# Patient Record
Sex: Male | Born: 1970 | Race: White | Hispanic: No | Marital: Married | Smoking: Never smoker
Health system: Southern US, Community
[De-identification: ages and names within clinical notes are randomized; demographics above are authoritative.]

## PROBLEM LIST (undated history)

## (undated) DIAGNOSIS — K219 Gastro-esophageal reflux disease without esophagitis: Secondary | ICD-10-CM

## (undated) DIAGNOSIS — G709 Myoneural disorder, unspecified: Secondary | ICD-10-CM

## (undated) DIAGNOSIS — I10 Essential (primary) hypertension: Secondary | ICD-10-CM

## (undated) DIAGNOSIS — M2550 Pain in unspecified joint: Secondary | ICD-10-CM

## (undated) DIAGNOSIS — F419 Anxiety disorder, unspecified: Secondary | ICD-10-CM

## (undated) DIAGNOSIS — M1990 Unspecified osteoarthritis, unspecified site: Secondary | ICD-10-CM

## (undated) DIAGNOSIS — R011 Cardiac murmur, unspecified: Secondary | ICD-10-CM

## (undated) DIAGNOSIS — Z9889 Other specified postprocedural states: Secondary | ICD-10-CM

## (undated) DIAGNOSIS — Z9689 Presence of other specified functional implants: Secondary | ICD-10-CM

## (undated) DIAGNOSIS — G4730 Sleep apnea, unspecified: Secondary | ICD-10-CM

## (undated) DIAGNOSIS — R112 Nausea with vomiting, unspecified: Secondary | ICD-10-CM

## (undated) HISTORY — PX: BACK SURGERY: SHX140

## (undated) HISTORY — PX: NASAL SINUS SURGERY: SHX719

## (undated) HISTORY — PX: ANKLE DEBRIDEMENT: SHX1147

## (undated) HISTORY — PX: ANKLE SURGERY: SHX546

## (undated) HISTORY — PX: LAMINECTOMY THORACIC SPINE W/ PLACEMENT SPINAL CORD STIMULATOR: SHX1917

## (undated) HISTORY — PX: SHOULDER OPEN ROTATOR CUFF REPAIR: SHX2407

## (undated) HISTORY — PX: CARDIAC CATHETERIZATION: SHX172

---

## 2003-12-20 HISTORY — PX: ANKLE SURGERY: SHX546

## 2010-02-04 ENCOUNTER — Ambulatory Visit (HOSPITAL_COMMUNITY): Admission: RE | Admit: 2010-02-04 | Discharge: 2010-02-04 | Payer: Self-pay | Admitting: Maternal and Fetal Medicine

## 2011-10-25 NOTE — H&P (Signed)
  NTS SOAP Note  Vital Signs:  Vitals as of: 10/25/2011: Systolic 132: Diastolic 75: Heart Rate 61: Temp 48F: Height 46ft 3in: Weight 294Lbs 0 Ounces: Pain Level 6: BMI 37  BMI : 36.75 kg/m2  Subjective: This 40 Years 59 Months old Male presents for of ABDOMINAL ISSUES: ,Has been having right upper quadrant abdominal pain with radiation to the right flank, nausea, and indigestion for the past few months. Was seen up in [place] for chest pain, underwent cardiac cath which was negative and EGD which showed reflux esophagitis. U/S of gallbladder negative, HIDA showed low gallbladder ejection fraction with reproducible symptoms with CCK injection. Denies fever, chills, jaundice.  Review of Symptoms:  Constitutional: unremarkable  Head: unremarkable  Eyes:unremarkable  Nose/Mouth/Throat:unremarkable  Cardiovascular:unremarkable  Respiratory: unremarkable  Gastrointestin abdominal pain,nausea,heartburn Genitourinary: unremarkable  back pain Skin: unremarkable  Hematolgic/Lymphatic: unremarkable  Allergic/Immunologic:unremarkable    Past Medical History:Reviewed  Past Medical History  Surgical History: cardiac cath, left ankle Medical Problems: High Blood pressure Psychiatric History: Depression Allergies: mobic Medications: paxil, ASA, bystolic, omeprazole   Social History: Reviewed   Social History  Preferred Language: English ([place]) Race: White Ethnicity: Not Hispanic / Latino Age: 40 Years 6 Months Marital Status: M Alcohol: Yes, socially Recreational drug(s): No   Smoking Status: Never smoker reviewed on 10/25/2011  Family History: Reviewed  Family History  Father: Coronary Artery Disease Mother: Cancer, Hypertension    Objective Information:  General: Well appearing, well nourished in no distress.  Skin:no rash or prominent lesions  Head: Atraumatic; no masses; no abnormalities  No scleral icterus Neck: Supple without lymphadenopathy.    Heart: RRR, no murmur or gallop. Normal S1, S2. No S3, S4.  Lungs:CTA bilaterally, no wheezes, rhonchi, rales. Breathing unlabored.  Abdomen: Soft, slightly tender in right upper quadrant to palpation, ND, no HSM, no masses.   Assessment: Chronic cholecystitis  Diagnosis & Procedure: DiagnosisCode: 575.11, ProcedureCode: 95621,    Plan: Scheduled for laparoscopic cholecystectomy on 10/28/11.   Patient Education: Alternative treatments to surgery were discussed with patient (and family).Risks and benefits of procedure were fully explained to the patient (and family) who gave informed consent. Patient/family questions were addressed.  Follow-up: Pending Surgery

## 2011-10-26 ENCOUNTER — Encounter (HOSPITAL_COMMUNITY): Payer: Self-pay

## 2011-10-26 ENCOUNTER — Encounter (HOSPITAL_COMMUNITY): Payer: Self-pay | Admitting: Pharmacy Technician

## 2011-10-27 ENCOUNTER — Encounter (HOSPITAL_COMMUNITY)
Admission: RE | Admit: 2011-10-27 | Discharge: 2011-10-27 | Payer: BC Managed Care – PPO | Source: Ambulatory Visit | Admitting: General Surgery

## 2011-10-27 HISTORY — DX: Anxiety disorder, unspecified: F41.9

## 2011-10-27 HISTORY — DX: Cardiac murmur, unspecified: R01.1

## 2011-10-27 HISTORY — DX: Essential (primary) hypertension: I10

## 2011-10-28 ENCOUNTER — Other Ambulatory Visit: Payer: Self-pay | Admitting: General Surgery

## 2011-10-28 ENCOUNTER — Encounter (HOSPITAL_COMMUNITY): Payer: Self-pay | Admitting: Anesthesiology

## 2011-10-28 ENCOUNTER — Other Ambulatory Visit: Payer: Self-pay

## 2011-10-28 ENCOUNTER — Encounter (HOSPITAL_COMMUNITY): Payer: Self-pay | Admitting: *Deleted

## 2011-10-28 ENCOUNTER — Encounter (HOSPITAL_COMMUNITY)
Admission: RE | Disposition: A | Discharge status: Home / Self Care | Payer: Self-pay | Source: Ambulatory Visit | Attending: General Surgery

## 2011-10-28 ENCOUNTER — Ambulatory Visit (HOSPITAL_COMMUNITY)
Admission: RE | Admit: 2011-10-28 | Discharge: 2011-10-28 | Disposition: A | Discharge status: Home / Self Care | Payer: BC Managed Care – PPO | Source: Ambulatory Visit | Attending: General Surgery | Admitting: General Surgery

## 2011-10-28 ENCOUNTER — Ambulatory Visit (HOSPITAL_COMMUNITY): Payer: BC Managed Care – PPO | Admitting: Anesthesiology

## 2011-10-28 DIAGNOSIS — Z79899 Other long term (current) drug therapy: Secondary | ICD-10-CM | POA: Insufficient documentation

## 2011-10-28 DIAGNOSIS — Z7982 Long term (current) use of aspirin: Secondary | ICD-10-CM | POA: Insufficient documentation

## 2011-10-28 DIAGNOSIS — K8010 Calculus of gallbladder with chronic cholecystitis without obstruction: Secondary | ICD-10-CM | POA: Insufficient documentation

## 2011-10-28 DIAGNOSIS — Z01812 Encounter for preprocedural laboratory examination: Secondary | ICD-10-CM | POA: Insufficient documentation

## 2011-10-28 DIAGNOSIS — I10 Essential (primary) hypertension: Secondary | ICD-10-CM | POA: Insufficient documentation

## 2011-10-28 HISTORY — PX: CHOLECYSTECTOMY: SHX55

## 2011-10-28 LAB — SURGICAL PCR SCREEN
MRSA, PCR: POSITIVE — AB
Staphylococcus aureus: POSITIVE — AB

## 2011-10-28 LAB — POCT I-STAT 4, (NA,K, GLUC, HGB,HCT)
Glucose, Bld: 94 mg/dL (ref 70–99)
HCT: 39 % (ref 39.0–52.0)
Hemoglobin: 13.3 g/dL (ref 13.0–17.0)
Potassium: 4.4 mEq/L (ref 3.5–5.1)
Sodium: 140 mEq/L (ref 135–145)

## 2011-10-28 SURGERY — LAPAROSCOPIC CHOLECYSTECTOMY
Anesthesia: General | Site: Abdomen | Wound class: Clean Contaminated

## 2011-10-28 MED ORDER — LIDOCAINE HCL 1 % IJ SOLN
INTRAMUSCULAR | Status: DC | PRN
  Administered 2011-10-28: 40 mg via INTRADERMAL

## 2011-10-28 MED ORDER — LACTATED RINGERS IV SOLN
INTRAVENOUS | Status: DC
  Administered 2011-10-28: 13:00:00 via INTRAVENOUS
  Administered 2011-10-28: 1000 mL via INTRAVENOUS

## 2011-10-28 MED ORDER — GLYCOPYRROLATE 0.2 MG/ML IJ SOLN
INTRAMUSCULAR | Status: AC
  Filled 2011-10-28: qty 1

## 2011-10-28 MED ORDER — NEOSTIGMINE METHYLSULFATE 1 MG/ML IJ SOLN
INTRAMUSCULAR | Status: DC | PRN
  Administered 2011-10-28: 3 mg via INTRAVENOUS

## 2011-10-28 MED ORDER — ENOXAPARIN SODIUM 40 MG/0.4ML ~~LOC~~ SOLN
40.0000 mg | Freq: Once | SUBCUTANEOUS | Status: AC
  Administered 2011-10-28: 40 mg via SUBCUTANEOUS

## 2011-10-28 MED ORDER — GLYCOPYRROLATE 0.2 MG/ML IJ SOLN
INTRAMUSCULAR | Status: AC
  Administered 2011-10-28: 0.2 mg via INTRAVENOUS
  Filled 2011-10-28: qty 1

## 2011-10-28 MED ORDER — MUPIROCIN 2 % EX OINT
TOPICAL_OINTMENT | CUTANEOUS | Status: AC
  Administered 2011-10-28: 1
  Filled 2011-10-28: qty 22

## 2011-10-28 MED ORDER — ENOXAPARIN SODIUM 40 MG/0.4ML ~~LOC~~ SOLN
SUBCUTANEOUS | Status: AC
  Administered 2011-10-28: 40 mg via SUBCUTANEOUS
  Filled 2011-10-28: qty 0.4

## 2011-10-28 MED ORDER — SUCCINYLCHOLINE CHLORIDE 20 MG/ML IJ SOLN
INTRAMUSCULAR | Status: DC | PRN
  Administered 2011-10-28: 200 mg via INTRAVENOUS

## 2011-10-28 MED ORDER — ROCURONIUM BROMIDE 100 MG/10ML IV SOLN
INTRAVENOUS | Status: DC | PRN
  Administered 2011-10-28: 35 mg via INTRAVENOUS

## 2011-10-28 MED ORDER — FENTANYL CITRATE 0.05 MG/ML IJ SOLN
25.0000 ug | INTRAMUSCULAR | Status: DC | PRN
  Administered 2011-10-28 (×2): 50 ug via INTRAVENOUS

## 2011-10-28 MED ORDER — ACETAMINOPHEN 10 MG/ML IV SOLN
INTRAVENOUS | Status: AC
  Administered 2011-10-28: 1000 mg via INTRAVENOUS
  Filled 2011-10-28: qty 100

## 2011-10-28 MED ORDER — PROPOFOL 10 MG/ML IV EMUL
INTRAVENOUS | Status: AC
  Filled 2011-10-28: qty 20

## 2011-10-28 MED ORDER — GLYCOPYRROLATE 0.2 MG/ML IJ SOLN
INTRAMUSCULAR | Status: DC | PRN
  Administered 2011-10-28: .4 mg via INTRAVENOUS

## 2011-10-28 MED ORDER — MIDAZOLAM HCL 2 MG/2ML IJ SOLN
1.0000 mg | INTRAMUSCULAR | Status: AC | PRN
  Administered 2011-10-28 (×3): 2 mg via INTRAVENOUS

## 2011-10-28 MED ORDER — MIDAZOLAM HCL 2 MG/2ML IJ SOLN
INTRAMUSCULAR | Status: AC
  Administered 2011-10-28: 2 mg via INTRAVENOUS
  Filled 2011-10-28: qty 2

## 2011-10-28 MED ORDER — PROPOFOL 10 MG/ML IV EMUL
INTRAVENOUS | Status: DC | PRN
  Administered 2011-10-28: 200 mg via INTRAVENOUS

## 2011-10-28 MED ORDER — ONDANSETRON HCL 4 MG/2ML IJ SOLN
INTRAMUSCULAR | Status: AC
  Administered 2011-10-28: 4 mg via INTRAVENOUS
  Filled 2011-10-28: qty 2

## 2011-10-28 MED ORDER — FENTANYL CITRATE 0.05 MG/ML IJ SOLN
INTRAMUSCULAR | Status: DC | PRN
  Administered 2011-10-28: 150 ug via INTRAVENOUS
  Administered 2011-10-28: 50 ug via INTRAVENOUS
  Administered 2011-10-28: 100 ug via INTRAVENOUS

## 2011-10-28 MED ORDER — CEFAZOLIN SODIUM 1-5 GM-% IV SOLN
INTRAVENOUS | Status: AC
  Filled 2011-10-28: qty 50

## 2011-10-28 MED ORDER — NEOSTIGMINE METHYLSULFATE 1 MG/ML IJ SOLN
INTRAMUSCULAR | Status: AC
  Filled 2011-10-28: qty 10

## 2011-10-28 MED ORDER — SUCCINYLCHOLINE CHLORIDE 20 MG/ML IJ SOLN
INTRAMUSCULAR | Status: AC
  Filled 2011-10-28: qty 1

## 2011-10-28 MED ORDER — BUPIVACAINE HCL 0.5 % IJ SOLN
INTRAMUSCULAR | Status: DC | PRN
  Administered 2011-10-28: 10 mL

## 2011-10-28 MED ORDER — CEFAZOLIN SODIUM-DEXTROSE 2-3 GM-% IV SOLR: 2.0000 g | INTRAVENOUS | Status: DC

## 2011-10-28 MED ORDER — GLYCOPYRROLATE 0.2 MG/ML IJ SOLN
0.2000 mg | Freq: Once | INTRAMUSCULAR | Status: AC | PRN
  Administered 2011-10-28: 0.2 mg via INTRAVENOUS

## 2011-10-28 MED ORDER — LIDOCAINE HCL (PF) 1 % IJ SOLN
INTRAMUSCULAR | Status: AC
  Filled 2011-10-28: qty 5

## 2011-10-28 MED ORDER — OXYCODONE-ACETAMINOPHEN 7.5-325 MG PO TABS: 1.0000 | ORAL_TABLET | ORAL | Status: AC | PRN

## 2011-10-28 MED ORDER — ONDANSETRON HCL 4 MG/2ML IJ SOLN
4.0000 mg | Freq: Once | INTRAMUSCULAR | Status: AC
  Administered 2011-10-28: 4 mg via INTRAVENOUS

## 2011-10-28 MED ORDER — LACTATED RINGERS IV SOLN: INTRAVENOUS | Status: DC

## 2011-10-28 MED ORDER — MIDAZOLAM HCL 5 MG/5ML IJ SOLN
INTRAMUSCULAR | Status: DC | PRN
  Administered 2011-10-28: 2 mg via INTRAVENOUS

## 2011-10-28 MED ORDER — ONDANSETRON HCL 4 MG/2ML IJ SOLN
4.0000 mg | Freq: Once | INTRAMUSCULAR | Status: AC | PRN
  Administered 2011-10-28: 4 mg via INTRAVENOUS

## 2011-10-28 MED ORDER — CEFAZOLIN SODIUM 1-5 GM-% IV SOLN
INTRAVENOUS | Status: DC | PRN
  Administered 2011-10-28: 2 g via INTRAVENOUS

## 2011-10-28 MED ORDER — FENTANYL CITRATE 0.05 MG/ML IJ SOLN
INTRAMUSCULAR | Status: AC
  Filled 2011-10-28: qty 5

## 2011-10-28 MED ORDER — FENTANYL CITRATE 0.05 MG/ML IJ SOLN
INTRAMUSCULAR | Status: AC
  Administered 2011-10-28: 50 ug via INTRAVENOUS
  Filled 2011-10-28: qty 2

## 2011-10-28 MED ORDER — MIDAZOLAM HCL 2 MG/2ML IJ SOLN
INTRAMUSCULAR | Status: AC
  Filled 2011-10-28: qty 2

## 2011-10-28 MED ORDER — ACETAMINOPHEN 10 MG/ML IV SOLN
1000.0000 mg | Freq: Once | INTRAVENOUS | Status: AC
  Administered 2011-10-28: 1000 mg via INTRAVENOUS

## 2011-10-28 MED ORDER — ACETAMINOPHEN 325 MG PO TABS: 325.0000 mg | ORAL_TABLET | ORAL | Status: DC | PRN

## 2011-10-28 MED ORDER — HEMOSTATIC AGENTS (NO CHARGE) OPTIME
TOPICAL | Status: DC | PRN
  Administered 2011-10-28: 1 via TOPICAL

## 2011-10-28 MED ORDER — FENTANYL CITRATE 0.05 MG/ML IJ SOLN
INTRAMUSCULAR | Status: AC
Start: 2011-10-28 — End: 2011-10-28
  Administered 2011-10-28: 50 ug via INTRAVENOUS
  Filled 2011-10-28: qty 2

## 2011-10-28 MED ORDER — SODIUM CHLORIDE 0.9 % IR SOLN
Status: DC | PRN
  Administered 2011-10-28: 1000 mL

## 2011-10-28 MED ORDER — ROCURONIUM BROMIDE 50 MG/5ML IV SOLN
INTRAVENOUS | Status: AC
  Filled 2011-10-28: qty 1

## 2011-10-28 MED ORDER — BUPIVACAINE HCL (PF) 0.5 % IJ SOLN
INTRAMUSCULAR | Status: AC
  Filled 2011-10-28: qty 30

## 2011-10-28 SURGICAL SUPPLY — 33 items
APPLIER CLIP ROT 10 11.4 M/L (STAPLE)
BAG HAMPER (MISCELLANEOUS) ×2 IMPLANT
CLIP APPLIE ROT 10 11.4 M/L (STAPLE) IMPLANT
CLOTH BEACON ORANGE TIMEOUT ST (SAFETY) ×2 IMPLANT
COVER LIGHT HANDLE STERIS (MISCELLANEOUS) ×4 IMPLANT
DECANTER SPIKE VIAL GLASS SM (MISCELLANEOUS) ×2 IMPLANT
DURAPREP 26ML APPLICATOR (WOUND CARE) ×2 IMPLANT
ELECT REM PT RETURN 9FT ADLT (ELECTROSURGICAL) ×2
ELECTRODE REM PT RTRN 9FT ADLT (ELECTROSURGICAL) ×1 IMPLANT
FILTER SMOKE EVAC LAPAROSHD (FILTER) ×2 IMPLANT
FORMALIN 10 PREFIL 120ML (MISCELLANEOUS) ×2 IMPLANT
GLOVE BIO SURGEON STRL SZ7.5 (GLOVE) ×2 IMPLANT
GLOVE ECLIPSE 6.5 STRL STRAW (GLOVE) ×2 IMPLANT
GLOVE ECLIPSE 7.0 STRL STRAW (GLOVE) ×2 IMPLANT
GLOVE INDICATOR 6.5 STRL GRN (GLOVE) ×2 IMPLANT
GLOVE INDICATOR 7.0 STRL GRN (GLOVE) ×4 IMPLANT
GOWN STRL REIN XL XLG (GOWN DISPOSABLE) ×8 IMPLANT
HEMOSTAT SNOW SURGICEL 2X4 (HEMOSTASIS) ×2 IMPLANT
INST SET LAPROSCOPIC AP (KITS) ×2 IMPLANT
KIT ROOM TURNOVER APOR (KITS) ×2 IMPLANT
KIT TROCAR LAP CHOLE (TROCAR) ×2 IMPLANT
MANIFOLD NEPTUNE II (INSTRUMENTS) ×2 IMPLANT
NS IRRIG 1000ML POUR BTL (IV SOLUTION) ×2 IMPLANT
PACK LAP CHOLE LZT030E (CUSTOM PROCEDURE TRAY) ×2 IMPLANT
PAD ARMBOARD 7.5X6 YLW CONV (MISCELLANEOUS) ×2 IMPLANT
POUCH SPECIMEN RETRIEVAL 10MM (ENDOMECHANICALS) ×2 IMPLANT
SET BASIN LINEN APH (SET/KITS/TRAYS/PACK) ×2 IMPLANT
SPONGE GAUZE 2X2 8PLY STRL LF (GAUZE/BANDAGES/DRESSINGS) ×8 IMPLANT
STAPLER VISISTAT (STAPLE) ×2 IMPLANT
SUT VICRYL 0 UR6 27IN ABS (SUTURE) ×2 IMPLANT
TAPE CLOTH SURG 4X10 WHT LF (GAUZE/BANDAGES/DRESSINGS) ×2 IMPLANT
WARMER LAPAROSCOPE (MISCELLANEOUS) ×2 IMPLANT
YANKAUER SUCT 12FT TUBE ARGYLE (SUCTIONS) ×2 IMPLANT

## 2011-10-28 NOTE — Transfer of Care (Signed)
Immediate Anesthesia Transfer of Care Note  Patient: Chase Wyatt  Procedure(s) Performed:  LAPAROSCOPIC CHOLECYSTECTOMY  Patient Location: PACU  Anesthesia Type: General  Level of Consciousness: awake and patient cooperative  Airway & Oxygen Therapy: Patient Spontanous Breathing and Patient connected to face mask oxygen  Post-op Assessment: Report given to PACU RN, Post -op Vital signs reviewed and stable, Patient moving all extremities and Patient moving all extremities X 4  Post vital signs: Reviewed and stable  Complications: No apparent anesthesia complications

## 2011-10-28 NOTE — Anesthesia Procedure Notes (Signed)
Procedure Name: Intubation Date/Time: 10/28/2011 12:34 PM Performed by: Despina Hidden Pre-anesthesia Checklist: Patient identified, Patient being monitored, Emergency Drugs available, Timeout performed and Suction available Patient Re-evaluated:Patient Re-evaluated prior to inductionOxygen Delivery Method: Circle System Utilized Preoxygenation: Pre-oxygenation with 100% oxygen Intubation Type: IV induction, Circoid Pressure applied and Rapid sequence Ventilation: Mask ventilation without difficulty Laryngoscope Size: Mac and 3 Grade View: Grade I Tube type: Oral Tube size: 8.0 mm Number of attempts: 1 Airway Equipment and Method: patient positioned with wedge pillow and stylet Placement Confirmation: ETT inserted through vocal cords under direct vision,  positive ETCO2 and breath sounds checked- equal and bilateral Secured at: 22 cm Tube secured with: Tape Dental Injury: Teeth and Oropharynx as per pre-operative assessment

## 2011-10-28 NOTE — Progress Notes (Signed)
Reported to jeanine barford rn

## 2011-10-28 NOTE — Anesthesia Postprocedure Evaluation (Signed)
  Anesthesia Post-op Note  Patient: Chase Wyatt  Procedure(s) Performed:  LAPAROSCOPIC CHOLECYSTECTOMY  Patient Location: PACU  Anesthesia Type: General  Level of Consciousness: awake, alert , oriented and patient cooperative  Airway and Oxygen Therapy: Patient Spontanous Breathing  Post-op Pain: 4 /10, moderate  Post-op Assessment: Post-op Vital signs reviewed, Patient's Cardiovascular Status Stable, Respiratory Function Stable, Patent Airway and No signs of Nausea or vomiting  Post-op Vital Signs: Reviewed and stable  Complications: no anesthesia complications

## 2011-10-28 NOTE — Interval H&P Note (Signed)
History and Physical Interval Note:   10/28/2011   12:11 PM   Chase Wyatt  has presented today for surgery, with the diagnosis of Calculus of gallbladder with other cholecystitis, without mention of obstruction [574.10]  The various methods of treatment have been discussed with the patient and family. After consideration of risks, benefits and other options for treatment, the patient has consented to  Procedure(s): LAPAROSCOPIC CHOLECYSTECTOMY as a surgical intervention .  The patients' history has been reviewed, patient examined, no change in status, stable for surgery.  I have reviewed the patients' chart and labs.  Questions were answered to the patient's satisfaction.     Dalia Heading  MD

## 2011-10-28 NOTE — Anesthesia Preprocedure Evaluation (Addendum)
Anesthesia Evaluation  Patient identified by MRN, date of birth, ID band Patient awake    Reviewed: Allergy & Precautions, H&P , NPO status , Patient's Chart, lab work & pertinent test results, reviewed documented beta blocker date and time   History of Anesthesia Complications Negative for: history of anesthetic complications  Airway Mallampati: I TM Distance: >3 FB Neck ROM: Full    Dental No notable dental hx.    Pulmonary sleep apnea ,    Pulmonary exam normal       Cardiovascular hypertension, Pt. on medications and Pt. on home beta blockers + Valvular Problems/Murmurs Regular Normal    Neuro/Psych Negative Neurological ROS  Negative Psych ROS   GI/Hepatic Neg liver ROS, GERD-  Medicated,  Endo/Other  Negative Endocrine ROS  Renal/GU negative Renal ROS     Musculoskeletal negative musculoskeletal ROS (+)   Abdominal Normal abdominal exam  (+)   Peds  Hematology negative hematology ROS (+)   Anesthesia Other Findings   Reproductive/Obstetrics negative OB ROS                          Anesthesia Physical Anesthesia Plan  ASA: II  Anesthesia Plan: General   Post-op Pain Management:    Induction: Intravenous, Rapid sequence and Cricoid pressure planned  Airway Management Planned: Oral ETT  Additional Equipment:   Intra-op Plan:   Post-operative Plan: Extubation in OR  Informed Consent:   Dental advisory given  Plan Discussed with: CRNA  Anesthesia Plan Comments:         Anesthesia Quick Evaluation

## 2011-10-28 NOTE — Op Note (Signed)
Patient:  Chase Wyatt  DOB:  01-Feb-1971  MRN:  [id number]   Preop Diagnosis:  Cholecystitis, chronic  Postop Diagnosis:  Same  Procedure:  Laparoscopic cholecystectomy  Surgeon:  Franky Macho, M.D.  Anes:   General endotracheal  Indications:  Patient is a 40 year old white male who presents with biliary colic secondary to a chronic cholecystitis. The risks and benefits of the procedure including bleeding, infection, hepatobiliary, possibly of an open procedure were fully expand the patient, gave informed consent.  Procedure note:  Patient is placed the supine position. After induction of general endotracheal anesthesia, the abdomen was prepped and draped using usual sterile technique with DuraPrep. Surgical site confirmation was performed.  A supraumbilical incision was made down the fascia. A Veress needle was introduced into the abdominal cavity and confirmation of placement was done using the saline drop test. The abdomen was then insufflated to 16 mm mercury pressure. An 11 mm trocar was introduced into the abdominal cavity under direct visualization the difficulty. Patient was placed in reverse Trendelenburg position additional 11 mm trocar was placed the epigastric region and 5 mm trochars placed were upper quadrant right flank regions. Liver was inspected and noted within normal limits. The gallbladder was retracted superior laterally. Dissection was begun around the infundibulum of the gallbladder. The cystic duct was first identified. Its junction to the infundibulum flow identified. Endoclips placed proximally distally on the cystic duct and the cystic duct was divided. This is likewise done cystic artery. The gallbladder was then freed away from the gallbladder fossa using Bovie electrocautery. The gallbladder delivered to the epigastric trocar site using an Endo Catch bag. The gallbladder fossa was inspected no abnormal bleeding or bile leakage was noted. Surgicel is placed the  gallbladder fossa. All fluid and air were then evacuated from the abdominal cavity prior to removal of the trochars.  All wounds were gave normal saline. All wounds were checked with 0.5% Sensorcaine. The supraumbilical fascia was reapproximated using an 0 Vicryl interrupted suture. All skin incisions were closed using staples. Betadine ointment after dressings were applied.  All tape and needle counts were correct at the end of the procedure. Patient was extubated in the operating room and went back to recovery awake in stable condition.  Complications:  None  EBL:  Minimal  Specimen:  Gallbladder

## 2011-11-04 ENCOUNTER — Encounter (HOSPITAL_COMMUNITY): Payer: Self-pay | Admitting: General Surgery

## 2011-12-20 HISTORY — PX: CARDIAC CATHETERIZATION: SHX172

## 2020-07-30 ENCOUNTER — Ambulatory Visit: Payer: BLUE CROSS/BLUE SHIELD | Admitting: General Surgery

## 2020-07-30 ENCOUNTER — Encounter (INDEPENDENT_AMBULATORY_CARE_PROVIDER_SITE_OTHER): Payer: Self-pay

## 2020-07-30 ENCOUNTER — Other Ambulatory Visit: Payer: Self-pay

## 2020-07-30 ENCOUNTER — Encounter: Payer: Self-pay | Admitting: General Surgery

## 2020-07-30 VITALS — BP 159/81 | HR 54 | Temp 98.4°F | Resp 16 | Ht 76.0 in | Wt 270.0 lb

## 2020-07-30 DIAGNOSIS — K4090 Unilateral inguinal hernia, without obstruction or gangrene, not specified as recurrent: Secondary | ICD-10-CM

## 2020-07-30 NOTE — Patient Instructions (Signed)
Inguinal Hernia, Adult An inguinal hernia develops when fat or the intestines push through a weak spot in a muscle where your leg meets your lower abdomen (groin). This creates a bulge. This kind of hernia could also be:  In your scrotum, if you are male.  In folds of skin around your vagina, if you are male. There are three types of inguinal hernias:  Hernias that can be pushed back into the abdomen (are reducible). This type rarely causes pain.  Hernias that are not reducible (are incarcerated).  Hernias that are not reducible and lose their blood supply (are strangulated). This type of hernia requires emergency surgery. What are the causes? This condition is caused by having a weak spot in the muscles or tissues in the groin. This weak spot develops over time. The hernia may poke through the weak spot when you suddenly strain your lower abdominal muscles, such as when you:  Lift a heavy object.  Strain to have a bowel movement. Constipation can lead to straining.  Cough. What increases the risk? This condition is more likely to develop in:  Men.  Pregnant women.  People who: ? Are overweight. ? Work in jobs that require long periods of standing or heavy lifting. ? Have had an inguinal hernia before. ? Smoke or have lung disease. These factors can lead to long-lasting (chronic) coughing. What are the signs or symptoms? Symptoms may depend on the size of the hernia. Often, a small inguinal hernia has no symptoms. Symptoms of a larger hernia may include:  A lump in the groin area. This is easier to see when standing. It might not be visible when lying down.  Pain or burning in the groin. This may get worse when lifting, straining, or coughing.  A dull ache or a feeling of pressure in the groin.  In men, an unusual lump in the scrotum. Symptoms of a strangulated inguinal hernia may include:  A bulge in your groin that is very painful and tender to the touch.  A bulge  that turns red or purple.  Fever, nausea, and vomiting.  Inability to have a bowel movement or to pass gas. How is this diagnosed? This condition is diagnosed based on your symptoms, your medical history, and a physical exam. Your health care provider may feel your groin area and ask you to cough. How is this treated? Treatment depends on the size of your hernia and whether you have symptoms. If you do not have symptoms, your health care provider may have you watch your hernia carefully and have you come in for follow-up visits. If your hernia is large or if you have symptoms, you may need surgery to repair the hernia. Follow these instructions at home: Lifestyle  Avoid lifting heavy objects.  Avoid standing for long periods of time.  Do not use any products that contain nicotine or tobacco, such as cigarettes and e-cigarettes. If you need help quitting, ask your health care provider.  Maintain a healthy weight. Preventing constipation  Take actions to prevent constipation. Constipation leads to straining with bowel movements, which can make a hernia worse or cause a hernia repair to break down. Your health care provider may recommend that you: ? Drink enough fluid to keep your urine pale yellow. ? Eat foods that are high in fiber, such as fresh fruits and vegetables, whole grains, and beans. ? Limit foods that are high in fat and processed sugars, such as fried or sweet foods. ? Take an over-the-counter   or prescription medicine for constipation. General instructions  You may try to push the hernia back in place by very gently pressing on it while lying down. Do not try to force the bulge back in if it will not push in easily.  Watch your hernia for any changes in shape, size, or color. Get help right away if you notice any changes.  Take over-the-counter and prescription medicines only as told by your health care provider.  Keep all follow-up visits as told by your health care  provider. This is important. Contact a health care provider if:  You have a fever.  You develop new symptoms.  Your symptoms get worse. Get help right away if:  You have pain in your groin that suddenly gets worse.  You have a bulge in your groin that: ? Suddenly gets bigger and does not get smaller. ? Becomes red or purple or painful to the touch.  You are a man and you have a sudden pain in your scrotum, or the size of your scrotum suddenly changes.  You cannot push the hernia back in place by very gently pressing on it when you are lying down. Do not try to force the bulge back in if it will not push in easily.  You have nausea or vomiting that does not go away.  You have a fast heartbeat.  You cannot have a bowel movement or pass gas. These symptoms may represent a serious problem that is an emergency. Do not wait to see if the symptoms will go away. Get medical help right away. Call your local emergency services (911 in the [place] Summary  An inguinal hernia develops when fat or the intestines push through a weak spot in a muscle where your leg meets your lower abdomen (groin).  This condition is caused by having a weak spot in muscles or tissue in your groin.  Symptoms may depend on the size of the hernia, and they may include pain or swelling in your groin. A small inguinal hernia often has no symptoms.  Treatment may not be needed if you do not have symptoms. If you have symptoms or a large hernia, you may need surgery to repair the hernia.  Avoid lifting heavy objects. Also avoid standing for long amounts of time. This information is not intended to replace advice given to you by your health care provider. Make sure you discuss any questions you have with your health care provider. Document Revised: 01/06/2018 Document Reviewed: 09/06/2017 Elsevier Patient Education  [place]. Ventral Hernia  A ventral hernia is a bulge of tissue from inside the abdomen  that pushes through a weak area of the muscles that form the front wall of the abdomen. The tissues inside the abdomen are inside a sac (peritoneum). These tissues include the small intestine, large intestine, and the fatty tissue that covers the intestines (omentum). Sometimes, the bulge that forms a hernia contains intestines. Other hernias contain only fat. Ventral hernias do not go away without surgical treatment. There are several types of ventral hernias. You may have:  A hernia at an incision site from previous abdominal surgery (incisional hernia).  A hernia just above the belly button (epigastric hernia), or at the belly button (umbilical hernia). These types of hernias can develop from heavy lifting or straining.  A hernia that comes and goes (reducible hernia). It may be visible only when you lift or strain. This type of hernia can be pushed back into the abdomen (reduced).  A hernia that traps abdominal tissue inside the hernia (incarcerated hernia). This type of hernia does not reduce.  A hernia that cuts off blood flow to the tissues inside the hernia (strangulated hernia). The tissues can start to die if this happens. This is a very painful bulge that cannot be reduced. A strangulated hernia is a medical emergency. What are the causes? This condition is caused by abdominal tissue putting pressure on an area of weakness in the abdominal muscles. What increases the risk? The following factors may make you more likely to develop this condition:  Being male.  Being 13 or older.  Being overweight or obese.  Having had previous abdominal surgery, especially if there was an infection after surgery.  Having had an injury to the abdominal wall.  Having had several pregnancies.  Having a buildup of fluid inside the abdomen (ascites). What are the signs or symptoms? The only symptom of a ventral hernia may be a painless bulge in the abdomen. A reducible hernia may be visible only  when you strain, cough, or lift. Other symptoms may include:  Dull pain.  A feeling of pressure. Signs and symptoms of a strangulated hernia may include:  Increasing pain.  Nausea and vomiting.  Pain when pressing on the hernia.  The skin over the hernia turning red or purple.  Constipation.  Blood in the stool (feces). How is this diagnosed? This condition may be diagnosed based on:  Your symptoms.  Your medical history.  A physical exam. You may be asked to cough or strain while standing. These actions increase the pressure inside your abdomen and force the hernia through the opening in your muscles. Your health care provider may try to reduce the hernia by pressing on it.  Imaging studies, such as an ultrasound or CT scan. How is this treated? This condition is treated with surgery. If you have a strangulated hernia, surgery is done as soon as possible. If your hernia is small and not incarcerated, you may be asked to lose some weight before surgery. Follow these instructions at home:  Follow instructions from your health care provider about eating or drinking restrictions.  If you are overweight, your health care provider may recommend that you increase your activity level and eat a healthier diet.  Do not lift anything that is heavier than 10 lb (4.5 kg).  Return to your normal activities as told by your health care provider. Ask your health care provider what activities are safe for you. You may need to avoid activities that increase pressure on your hernia.  Take over-the-counter and prescription medicines only as told by your health care provider.  Keep all follow-up visits as told by your health care provider. This is important. Contact a health care provider if:  Your hernia gets larger.  Your hernia becomes painful. Get help right away if:  Your hernia becomes increasingly painful.  You have pain along with any of the following: ? Changes in skin color  in the area of the hernia. ? Nausea. ? Vomiting. ? Fever. Summary  A ventral hernia is a bulge of tissue from inside the abdomen that pushes through a weak area of the muscles that form the front wall of the abdomen.  This condition is treated with surgery, which may be urgent depending on your hernia.  Do not lift anything that is heavier than 10 lb (4.5 kg), and follow activity instructions from your health care provider. This information is not intended to replace  advice given to you by your health care provider. Make sure you discuss any questions you have with your health care provider. Document Revised: 01/17/2018 Document Reviewed: 06/26/2017 Elsevier Patient Education  [place].

## 2020-07-30 NOTE — Progress Notes (Signed)
Chase Wyatt; [id number]; 1970-12-27   HPI Patient is a 49 year old white male who was referred to my care by Dr. Laney Pastor for evaluation treatment of a left inguinal hernia.  This was found on work-up of testicular pain.  He has been having left-sided abdominal pain for many months.  It is not associated with eating.  It is somewhat made worse with straining or movement.  The pain in the left groin does radiate down to the left testicle in the left inner thigh.  He also has some umbilical pain when palpated.  He is status post laparoscopic cholecystectomy in the remote past.  He denies any fever, chills, nausea, or vomiting. Past Medical History:  Diagnosis Date  . Anxiety   . Heart murmur   . Hypertension     Past Surgical History:  Procedure Laterality Date  . ANKLE DEBRIDEMENT  3 yrs ago -[place]   left  . 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 CARDIAC CATHETERIZATION  1 month ago   [place]  . CHOLECYSTECTOMY  10/28/2011   Procedure: LAPAROSCOPIC CHOLECYSTECTOMY;  Surgeon: Jamesetta So;  Location: [facility];  Service: General;  Laterality: N/A;    Family History  Problem Relation Age of Onset  . Hypotension Neg Hx   . Anesthesia problems Neg Hx   . Malignant hyperthermia Neg Hx   . Pseudochol deficiency Neg Hx     Current Outpatient Medications on File Prior to Visit  Medication Sig Dispense Refill  . levocetirizine (XYZAL) 5 MG tablet Take 5 mg by mouth every evening.    . Multiple Vitamin (MULTIVITAMIN) tablet Take 1 tablet by mouth daily.      Marland Kitchen omeprazole (PRILOSEC) 20 MG capsule Take 20 mg by mouth daily.      Marland Kitchen testosterone cypionate (DEPOTESTOSTERONE CYPIONATE) 200 MG/ML injection Inject 200 mg into the muscle every 14 (fourteen) days.     No current facility-administered medications on file prior to visit.    Allergies  Allergen Reactions  . Benzoin Other (See Comments)  . Freederm Adhesive Remover [New Skin]   .  Meloxicam Hives  . Oxycodone Other (See Comments)    Social History   Substance and Sexual Activity  Alcohol Use Yes  . Alcohol/week: 1.0 standard drink  . Types: 1 Cans of beer per week   Comment: occassional    Social History   Tobacco Use  Smoking Status Never Smoker  Smokeless Tobacco Former Systems developer    Review of Systems  Constitutional: Negative.   HENT: Negative.   Eyes: Negative.   Respiratory: Negative.   Cardiovascular: Negative.   Gastrointestinal: Positive for abdominal pain.  Genitourinary: Negative.   Musculoskeletal: Negative.   Skin: Negative.   Neurological: Negative.   Endo/Heme/Allergies: Negative.   Psychiatric/Behavioral: Negative.     Objective   Vitals:   07/30/20 1127  BP: (!) 159/81  Pulse: (!) 54  Resp: 16  Temp: 98.4 F (36.9 C)  SpO2: 97%    Physical Exam Vitals reviewed.  Constitutional:      Appearance: Normal appearance. He is not ill-appearing.  HENT:     Head: Normocephalic and atraumatic.  Cardiovascular:     Rate and Rhythm: Normal rate and regular rhythm.     Heart sounds: Normal heart sounds. No murmur heard.  No friction rub. No gallop.   Pulmonary:     Effort: Pulmonary effort is normal. No respiratory distress.     Breath  sounds: Normal breath sounds. No stridor. No wheezing, rhonchi or rales.  Abdominal:     General: Bowel sounds are normal. There is no distension.     Palpations: Abdomen is soft. There is no mass.     Tenderness: There is no abdominal tenderness. There is no guarding or rebound.     Hernia: A hernia is present.     Comments: A small umbilical hernia is present and when palpated, somewhat reproduces his left-sided abdominal pain.  I could not appreciate an incisional hernia at the supraumbilical surgical scar.  He also has a reducible left inguinal hernia.  Skin:    General: Skin is warm and dry.  Neurological:     Mental Status: He is alert and oriented to person, place, and time.   Primary  care notes and urology notes reviewed  Assessment  Symptomatic left inguinal hernia Symptomatic umbilical hernia Plan   Patient is scheduled for a left inguinal herniorrhaphy with mesh, umbilical herniorrhaphy with mesh on 08/07/2020.  The risks and benefits of the procedure including bleeding, infection, mesh use, groin pain, and the possibility of recurrence of the hernias were fully explained to the patient, who gave informed consent.

## 2020-07-30 NOTE — H&P (Signed)
Chase Wyatt; [id number]; Jun 06, 1971   HPI Patient is a 49 year old white male who was referred to my care by Dr. Laney Pastor for evaluation treatment of a left inguinal hernia.  This was found on work-up of testicular pain.  He has been having left-sided abdominal pain for many months.  It is not associated with eating.  It is somewhat made worse with straining or movement.  The pain in the left groin does radiate down to the left testicle in the left inner thigh.  He also has some umbilical pain when palpated.  He is status post laparoscopic cholecystectomy in the remote past.  He denies any fever, chills, nausea, or vomiting. Past Medical History:  Diagnosis Date  . Anxiety   . Heart murmur   . Hypertension     Past Surgical History:  Procedure Laterality Date  . ANKLE DEBRIDEMENT  3 yrs ago -[place]   left  . 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 CARDIAC CATHETERIZATION  1 month ago   [place]  . CHOLECYSTECTOMY  10/28/2011   Procedure: LAPAROSCOPIC CHOLECYSTECTOMY;  Surgeon: Jamesetta So;  Location: [facility];  Service: General;  Laterality: N/A;    Family History  Problem Relation Age of Onset  . Hypotension Neg Hx   . Anesthesia problems Neg Hx   . Malignant hyperthermia Neg Hx   . Pseudochol deficiency Neg Hx     Current Outpatient Medications on File Prior to Visit  Medication Sig Dispense Refill  . levocetirizine (XYZAL) 5 MG tablet Take 5 mg by mouth every evening.    . Multiple Vitamin (MULTIVITAMIN) tablet Take 1 tablet by mouth daily.      Marland Kitchen omeprazole (PRILOSEC) 20 MG capsule Take 20 mg by mouth daily.      Marland Kitchen testosterone cypionate (DEPOTESTOSTERONE CYPIONATE) 200 MG/ML injection Inject 200 mg into the muscle every 14 (fourteen) days.     No current facility-administered medications on file prior to visit.    Allergies  Allergen Reactions  . Benzoin Other (See Comments)  . Freederm Adhesive Remover [New Skin]   .  Meloxicam Hives  . Oxycodone Other (See Comments)    Social History   Substance and Sexual Activity  Alcohol Use Yes  . Alcohol/week: 1.0 standard drink  . Types: 1 Cans of beer per week   Comment: occassional    Social History   Tobacco Use  Smoking Status Never Smoker  Smokeless Tobacco Former Systems developer    Review of Systems  Constitutional: Negative.   HENT: Negative.   Eyes: Negative.   Respiratory: Negative.   Cardiovascular: Negative.   Gastrointestinal: Positive for abdominal pain.  Genitourinary: Negative.   Musculoskeletal: Negative.   Skin: Negative.   Neurological: Negative.   Endo/Heme/Allergies: Negative.   Psychiatric/Behavioral: Negative.     Objective   Vitals:   07/30/20 1127  BP: (!) 159/81  Pulse: (!) 54  Resp: 16  Temp: 98.4 F (36.9 C)  SpO2: 97%    Physical Exam Vitals reviewed.  Constitutional:      Appearance: Normal appearance. He is not ill-appearing.  HENT:     Head: Normocephalic and atraumatic.  Cardiovascular:     Rate and Rhythm: Normal rate and regular rhythm.     Heart sounds: Normal heart sounds. No murmur heard.  No friction rub. No gallop.   Pulmonary:     Effort: Pulmonary effort is normal. No respiratory distress.     Breath  sounds: Normal breath sounds. No stridor. No wheezing, rhonchi or rales.  Abdominal:     General: Bowel sounds are normal. There is no distension.     Palpations: Abdomen is soft. There is no mass.     Tenderness: There is no abdominal tenderness. There is no guarding or rebound.     Hernia: A hernia is present.     Comments: A small umbilical hernia is present and when palpated, somewhat reproduces his left-sided abdominal pain.  I could not appreciate an incisional hernia at the supraumbilical surgical scar.  He also has a reducible left inguinal hernia.  Skin:    General: Skin is warm and dry.  Neurological:     Mental Status: He is alert and oriented to person, place, and time.   Primary  care notes and urology notes reviewed  Assessment  Symptomatic left inguinal hernia Symptomatic umbilical hernia Plan   Patient is scheduled for a left inguinal herniorrhaphy with mesh, umbilical herniorrhaphy with mesh on 08/07/2020.  The risks and benefits of the procedure including bleeding, infection, mesh use, groin pain, and the possibility of recurrence of the hernias were fully explained to the patient, who gave informed consent.

## 2020-08-01 NOTE — Patient Instructions (Signed)
Chase Wyatt  08/01/2020     @PREFPERIOPPHARMACY @   Your procedure is scheduled on 08/07/2020.  Report to [name] at  [phone]  A.M.  Call this number if you have problems the morning of surgery:  [phone]   Remember:  Do not eat or drink after midnight.                       Take these medicines the morning of surgery with A SIP OF WATER  Xyzal, omeprazole.    Do not wear jewelry, make-up or nail polish.  Do not wear lotions, powders, or perfumes. Please wear deodorant and brush your teeth.  Do not shave 48 hours prior to surgery.  Men may shave face and neck.  Do not bring valuables to the hospital.  [name] is not responsible for any belongings or valuables.  Contacts, dentures or bridgework may not be worn into surgery.  Leave your suitcase in the car.  After surgery it may be brought to your room.  For patients admitted to the hospital, discharge time will be determined by your treatment team.  Patients discharged the day of surgery will not be allowed to drive home.   Name and phone number of your driver:   family Special instructions:  DO NOT smoke the morning of your procedure.  Please read over the following fact sheets that you were given. Anesthesia Post-op Instructions and Care and Recovery After Surgery       Open Hernia Repair, Adult, Care After These instructions give you information about caring for yourself after your procedure. Your doctor may also give you more specific instructions. If you have problems or questions, contact your doctor. Follow these instructions at home: Surgical cut (incision) care   Follow instructions from your doctor about how to take care of your surgical cut area. Make sure you: ? Wash your hands with soap and water before you change your bandage (dressing). If you cannot use soap and water, use hand sanitizer. ? Change your bandage as told by your doctor. ? Leave stitches (sutures), skin glue, or skin  tape (adhesive) strips in place. They may need to stay in place for 2 weeks or longer. If tape strips get loose and curl up, you may trim the loose edges. Do not remove tape strips completely unless your doctor says it is okay.  Check your surgical cut every day for signs of infection. Check for: ? More redness, swelling, or pain. ? More fluid or blood. ? Warmth. ? Pus or a bad smell. Activity  Do not drive or use heavy machinery while taking prescription pain medicine. Do not drive until your doctor says it is okay.  Until your doctor says it is okay: ? Do not lift anything that is heavier than 10 lb (4.5 kg). ? Do not play contact sports.  Return to your normal activities as told by your doctor. Ask your doctor what activities are safe. General instructions  To prevent or treat having a hard time pooping (constipation) while you are taking prescription pain medicine, your doctor may recommend that you: ? Drink enough fluid to keep your pee (urine) clear or pale yellow. ? Take over-the-counter or prescription medicines. ? Eat foods that are high in fiber, such as fresh fruits and vegetables, whole grains, and beans. ? Limit foods that are high in fat and processed sugars, such as fried and sweet foods.  Take over-the-counter and prescription medicines only as told by your doctor.  Do not take baths, swim, or use a hot tub until your doctor says it is okay.  Keep all follow-up visits as told by your doctor. This is important. Contact a doctor if:  You develop a rash.  You have more redness, swelling, or pain around your surgical cut.  You have more fluid or blood coming from your surgical cut.  Your surgical cut feels warm to the touch.  You have pus or a bad smell coming from your surgical cut.  You have a fever or chills.  You have blood in your poop (stool).  You have not pooped in 2-3 days.  Medicine does not help your pain. Get help right away if:  You have  chest pain or you are short of breath.  You feel light-headed.  You feel weak and dizzy (feel faint).  You have very bad pain.  You throw up (vomit) and your pain is worse. This information is not intended to replace advice given to you by your health care provider. Make sure you discuss any questions you have with your health care provider. Document Revised: 03/29/2019 Document Reviewed: 05/18/2016 Elsevier Patient Education  2020 [place] Anesthesia, Adult, Care After This sheet gives you information about how to care for yourself after your procedure. Your health care provider may also give you more specific instructions. If you have problems or questions, contact your health care provider. What can I expect after the procedure? After the procedure, the following side effects are common:  Pain or discomfort at the IV site.  Nausea.  Vomiting.  Sore throat.  Trouble concentrating.  Feeling cold or chills.  Weak or tired.  Sleepiness and fatigue.  Soreness and body aches. These side effects can affect parts of the body that were not involved in surgery. Follow these instructions at home:  For at least 24 hours after the procedure:  Have a responsible adult stay with you. It is important to have someone help care for you until you are awake and alert.  Rest as needed.  Do not: ? Participate in activities in which you could fall or become injured. ? Drive. ? Use heavy machinery. ? Drink alcohol. ? Take sleeping pills or medicines that cause drowsiness. ? Make important decisions or sign legal documents. ? Take care of children on your own. Eating and drinking  Follow any instructions from your health care provider about eating or drinking restrictions.  When you feel hungry, start by eating small amounts of foods that are soft and easy to digest (bland), such as toast. Gradually return to your regular diet.  Drink enough fluid to keep your urine  pale yellow.  If you vomit, rehydrate by drinking water, juice, or clear broth. General instructions  If you have sleep apnea, surgery and certain medicines can increase your risk for breathing problems. Follow instructions from your health care provider about wearing your sleep device: ? Anytime you are sleeping, including during daytime naps. ? While taking prescription pain medicines, sleeping medicines, or medicines that make you drowsy.  Return to your normal activities as told by your health care provider. Ask your health care provider what activities are safe for you.  Take over-the-counter and prescription medicines only as told by your health care provider.  If you smoke, do not smoke without supervision.  Keep all follow-up visits as told by your health care provider. This is important. Contact  a health care provider if:  You have nausea or vomiting that does not get better with medicine.  You cannot eat or drink without vomiting.  You have pain that does not get better with medicine.  You are unable to pass urine.  You develop a skin rash.  You have a fever.  You have redness around your IV site that gets worse. Get help right away if:  You have difficulty breathing.  You have chest pain.  You have blood in your urine or stool, or you vomit blood. Summary  After the procedure, it is common to have a sore throat or nausea. It is also common to feel tired.  Have a responsible adult stay with you for the first 24 hours after general anesthesia. It is important to have someone help care for you until you are awake and alert.  When you feel hungry, start by eating small amounts of foods that are soft and easy to digest (bland), such as toast. Gradually return to your regular diet.  Drink enough fluid to keep your urine pale yellow.  Return to your normal activities as told by your health care provider. Ask your health care provider what activities are safe for  you. This information is not intended to replace advice given to you by your health care provider. Make sure you discuss any questions you have with your health care provider. Document Revised: 12/08/2017 Document Reviewed: 07/21/2017 Elsevier Patient Education  [place]. How to Use Chlorhexidine for Bathing Chlorhexidine gluconate (CHG) is a germ-killing (antiseptic) solution that is used to clean the skin. It can get rid of the bacteria that normally live on the skin and can keep them away for about 24 hours. To clean your skin with CHG, you may be given:  A CHG solution to use in the shower or as part of a sponge bath.  A prepackaged cloth that contains CHG. Cleaning your skin with CHG may help lower the risk for infection:  While you are staying in the intensive care unit of the hospital.  If you have a vascular access, such as a central line, to provide short-term or long-term access to your veins.  If you have a catheter to drain urine from your bladder.  If you are on a ventilator. A ventilator is a machine that helps you breathe by moving air in and out of your lungs.  After surgery. What are the risks? Risks of using CHG include:  A skin reaction.  Hearing loss, if CHG gets in your ears.  Eye injury, if CHG gets in your eyes and is not rinsed out.  The CHG product catching fire. Make sure that you avoid smoking and flames after applying CHG to your skin. Do not use CHG:  If you have a chlorhexidine allergy or have previously reacted to chlorhexidine.  On babies younger than 26 months of age. How to use CHG solution  Use CHG only as told by your health care provider, and follow the instructions on the label.  Use the full amount of CHG as directed. Usually, this is one bottle. During a shower Follow these steps when using CHG solution during a shower (unless your health care provider gives you different instructions): 1. Start the shower. 2. Use your  normal soap and shampoo to wash your face and hair. 3. Turn off the shower or move out of the shower stream. 4. Pour the CHG onto a clean washcloth. Do not use any type  of brush or rough-edged sponge. 5. Starting at your neck, lather your body down to your toes. Make sure you follow these instructions: ? If you will be having surgery, pay special attention to the part of your body where you will be having surgery. Scrub this area for at least 1 minute. ? Do not use CHG on your head or face. If the solution gets into your ears or eyes, rinse them well with water. ? Avoid your genital area. ? Avoid any areas of skin that have broken skin, cuts, or scrapes. ? Scrub your back and under your arms. Make sure to wash skin folds. 6. Let the lather sit on your skin for 1-2 minutes or as long as told by your health care provider. 7. Thoroughly rinse your entire body in the shower. Make sure that all body creases and crevices are rinsed well. 8. Dry off with a clean towel. Do not put any substances on your body afterward--such as powder, lotion, or perfume--unless you are told to do so by your health care provider. Only use lotions that are recommended by the manufacturer. 9. Put on clean clothes or pajamas. 10. If it is the night before your surgery, sleep in clean sheets.  During a sponge bath Follow these steps when using CHG solution during a sponge bath (unless your health care provider gives you different instructions): 1. Use your normal soap and shampoo to wash your face and hair. 2. Pour the CHG onto a clean washcloth. 3. Starting at your neck, lather your body down to your toes. Make sure you follow these instructions: ? If you will be having surgery, pay special attention to the part of your body where you will be having surgery. Scrub this area for at least 1 minute. ? Do not use CHG on your head or face. If the solution gets into your ears or eyes, rinse them well with water. ? Avoid your  genital area. ? Avoid any areas of skin that have broken skin, cuts, or scrapes. ? Scrub your back and under your arms. Make sure to wash skin folds. 4. Let the lather sit on your skin for 1-2 minutes or as long as told by your health care provider. 5. Using a different clean, wet washcloth, thoroughly rinse your entire body. Make sure that all body creases and crevices are rinsed well. 6. Dry off with a clean towel. Do not put any substances on your body afterward--such as powder, lotion, or perfume--unless you are told to do so by your health care provider. Only use lotions that are recommended by the manufacturer. 7. Put on clean clothes or pajamas. 8. If it is the night before your surgery, sleep in clean sheets. How to use CHG prepackaged cloths  Only use CHG cloths as told by your health care provider, and follow the instructions on the label.  Use the CHG cloth on clean, dry skin.  Do not use the CHG cloth on your head or face unless your health care provider tells you to.  When washing with the CHG cloth: ? Avoid your genital area. ? Avoid any areas of skin that have broken skin, cuts, or scrapes. Before surgery Follow these steps when using a CHG cloth to clean before surgery (unless your health care provider gives you different instructions): 1. Using the CHG cloth, vigorously scrub the part of your body where you will be having surgery. Scrub using a back-and-forth motion for 3 minutes. The area on your body  should be completely wet with CHG when you are done scrubbing. 2. Do not rinse. Discard the cloth and let the area air-dry. Do not put any substances on the area afterward, such as powder, lotion, or perfume. 3. Put on clean clothes or pajamas. 4. If it is the night before your surgery, sleep in clean sheets.  For general bathing Follow these steps when using CHG cloths for general bathing (unless your health care provider gives you different instructions). 1. Use a  separate CHG cloth for each area of your body. Make sure you wash between any folds of skin and between your fingers and toes. Wash your body in the following order, switching to a new cloth after each step: ? The front of your neck, shoulders, and chest. ? Both of your arms, under your arms, and your hands. ? Your stomach and groin area, avoiding the genitals. ? Your right leg and foot. ? Your left leg and foot. ? The back of your neck, your back, and your buttocks. 2. Do not rinse. Discard the cloth and let the area air-dry. Do not put any substances on your body afterward--such as powder, lotion, or perfume--unless you are told to do so by your health care provider. Only use lotions that are recommended by the manufacturer. 3. Put on clean clothes or pajamas. Contact a health care provider if:  Your skin gets irritated after scrubbing.  You have questions about using your solution or cloth. Get help right away if:  Your eyes become very red or swollen.  Your eyes itch badly.  Your skin itches badly and is red or swollen.  Your hearing changes.  You have trouble seeing.  You have swelling or tingling in your mouth or throat.  You have trouble breathing.  You swallow any chlorhexidine. Summary  Chlorhexidine gluconate (CHG) is a germ-killing (antiseptic) solution that is used to clean the skin. Cleaning your skin with CHG may help to lower your risk for infection.  You may be given CHG to use for bathing. It may be in a bottle or in a prepackaged cloth to use on your skin. Carefully follow your health care provider's instructions and the instructions on the product label.  Do not use CHG if you have a chlorhexidine allergy.  Contact your health care provider if your skin gets irritated after scrubbing. This information is not intended to replace advice given to you by your health care provider. Make sure you discuss any questions you have with your health care  provider. Document Revised: 02/21/2019 Document Reviewed: 11/02/2017 Elsevier Patient Education  [place].

## 2020-08-05 ENCOUNTER — Other Ambulatory Visit: Payer: Self-pay

## 2020-08-05 ENCOUNTER — Other Ambulatory Visit (HOSPITAL_COMMUNITY)
Admission: RE | Admit: 2020-08-05 | Discharge: 2020-08-05 | Disposition: A | Discharge status: Home / Self Care | Payer: BC Managed Care – PPO | Source: Ambulatory Visit | Attending: General Surgery | Admitting: General Surgery

## 2020-08-05 ENCOUNTER — Encounter (HOSPITAL_COMMUNITY)
Admission: RE | Admit: 2020-08-05 | Discharge: 2020-08-05 | Disposition: A | Discharge status: Home / Self Care | Payer: BC Managed Care – PPO | Source: Ambulatory Visit | Attending: General Surgery | Admitting: General Surgery

## 2020-08-05 ENCOUNTER — Encounter (HOSPITAL_COMMUNITY): Payer: Self-pay

## 2020-08-05 DIAGNOSIS — Z01818 Encounter for other preprocedural examination: Secondary | ICD-10-CM | POA: Insufficient documentation

## 2020-08-05 DIAGNOSIS — Z20822 Contact with and (suspected) exposure to covid-19: Secondary | ICD-10-CM | POA: Diagnosis not present

## 2020-08-05 HISTORY — DX: Gastro-esophageal reflux disease without esophagitis: K21.9

## 2020-08-05 HISTORY — DX: Myoneural disorder, unspecified: G70.9

## 2020-08-05 HISTORY — DX: Nausea with vomiting, unspecified: R11.2

## 2020-08-05 HISTORY — DX: Other specified postprocedural states: Z98.890

## 2020-08-05 HISTORY — DX: Sleep apnea, unspecified: G47.30

## 2020-08-05 HISTORY — DX: Unspecified osteoarthritis, unspecified site: M19.90

## 2020-08-05 LAB — SARS CORONAVIRUS 2 (TAT 6-24 HRS): SARS Coronavirus 2: NEGATIVE

## 2020-08-07 ENCOUNTER — Encounter (HOSPITAL_COMMUNITY): Payer: Self-pay | Admitting: General Surgery

## 2020-08-07 ENCOUNTER — Ambulatory Visit (HOSPITAL_COMMUNITY)
Admission: RE | Admit: 2020-08-07 | Discharge: 2020-08-07 | Disposition: A | Discharge status: Home / Self Care | Payer: BC Managed Care – PPO | Attending: General Surgery | Admitting: General Surgery

## 2020-08-07 ENCOUNTER — Encounter (HOSPITAL_COMMUNITY)
Admission: RE | Disposition: A | Discharge status: Home / Self Care | Payer: Self-pay | Source: Home / Self Care | Attending: General Surgery

## 2020-08-07 ENCOUNTER — Ambulatory Visit (HOSPITAL_COMMUNITY): Payer: BC Managed Care – PPO | Admitting: Anesthesiology

## 2020-08-07 DIAGNOSIS — K429 Umbilical hernia without obstruction or gangrene: Secondary | ICD-10-CM

## 2020-08-07 DIAGNOSIS — K4090 Unilateral inguinal hernia, without obstruction or gangrene, not specified as recurrent: Secondary | ICD-10-CM

## 2020-08-07 DIAGNOSIS — M1990 Unspecified osteoarthritis, unspecified site: Secondary | ICD-10-CM | POA: Insufficient documentation

## 2020-08-07 DIAGNOSIS — Z79899 Other long term (current) drug therapy: Secondary | ICD-10-CM | POA: Diagnosis not present

## 2020-08-07 DIAGNOSIS — Z886 Allergy status to analgesic agent status: Secondary | ICD-10-CM | POA: Diagnosis not present

## 2020-08-07 DIAGNOSIS — G4730 Sleep apnea, unspecified: Secondary | ICD-10-CM | POA: Insufficient documentation

## 2020-08-07 DIAGNOSIS — Z888 Allergy status to other drugs, medicaments and biological substances status: Secondary | ICD-10-CM | POA: Insufficient documentation

## 2020-08-07 DIAGNOSIS — I10 Essential (primary) hypertension: Secondary | ICD-10-CM | POA: Diagnosis not present

## 2020-08-07 DIAGNOSIS — K219 Gastro-esophageal reflux disease without esophagitis: Secondary | ICD-10-CM | POA: Insufficient documentation

## 2020-08-07 DIAGNOSIS — Z885 Allergy status to narcotic agent status: Secondary | ICD-10-CM | POA: Diagnosis not present

## 2020-08-07 HISTORY — PX: INGUINAL HERNIA REPAIR: SHX194

## 2020-08-07 HISTORY — PX: UMBILICAL HERNIA REPAIR: SHX196

## 2020-08-07 SURGERY — REPAIR, HERNIA, UMBILICAL, ADULT
Anesthesia: General | Site: Abdomen

## 2020-08-07 MED ORDER — FENTANYL CITRATE (PF) 100 MCG/2ML IJ SOLN
INTRAMUSCULAR | Status: AC
  Filled 2020-08-07: qty 2

## 2020-08-07 MED ORDER — HYDROMORPHONE HCL 1 MG/ML IJ SOLN
0.2500 mg | INTRAMUSCULAR | Status: DC | PRN
  Administered 2020-08-07 (×3): 0.5 mg via INTRAVENOUS
  Filled 2020-08-07 (×3): qty 0.5

## 2020-08-07 MED ORDER — SODIUM CHLORIDE 0.9 % IR SOLN
Status: DC | PRN
  Administered 2020-08-07: 1000 mL

## 2020-08-07 MED ORDER — PROMETHAZINE HCL 25 MG/ML IJ SOLN
6.2500 mg | INTRAMUSCULAR | Status: DC | PRN
  Administered 2020-08-07: 12.5 mg via INTRAVENOUS
  Filled 2020-08-07: qty 1

## 2020-08-07 MED ORDER — BUPIVACAINE LIPOSOME 1.3 % IJ SUSP
INTRAMUSCULAR | Status: DC | PRN
  Administered 2020-08-07: 20 mL

## 2020-08-07 MED ORDER — ROCURONIUM BROMIDE 10 MG/ML (PF) SYRINGE
PREFILLED_SYRINGE | INTRAVENOUS | Status: AC
  Filled 2020-08-07: qty 10

## 2020-08-07 MED ORDER — CHLORHEXIDINE GLUCONATE 0.12 % MT SOLN
15.0000 mL | Freq: Once | OROMUCOSAL | Status: AC
  Administered 2020-08-07: 15 mL via OROMUCOSAL

## 2020-08-07 MED ORDER — ONDANSETRON HCL 4 MG/2ML IJ SOLN
INTRAMUSCULAR | Status: AC
  Filled 2020-08-07: qty 2

## 2020-08-07 MED ORDER — BUPIVACAINE LIPOSOME 1.3 % IJ SUSP
INTRAMUSCULAR | Status: AC
  Filled 2020-08-07: qty 20

## 2020-08-07 MED ORDER — LIDOCAINE HCL (CARDIAC) PF 100 MG/5ML IV SOSY
PREFILLED_SYRINGE | INTRAVENOUS | Status: DC | PRN
  Administered 2020-08-07: 100 mg via INTRAVENOUS

## 2020-08-07 MED ORDER — HYDROCODONE-ACETAMINOPHEN 10-325 MG PO TABS
1.0000 | ORAL_TABLET | Freq: Four times a day (QID) | ORAL | 0 refills | Status: DC | PRN
Start: 2020-08-07 — End: 2020-08-20

## 2020-08-07 MED ORDER — CEFAZOLIN SODIUM-DEXTROSE 2-4 GM/100ML-% IV SOLN
2.0000 g | INTRAVENOUS | Status: AC
  Administered 2020-08-07: 2 g via INTRAVENOUS

## 2020-08-07 MED ORDER — CHLORHEXIDINE GLUCONATE CLOTH 2 % EX PADS: 6.0000 | MEDICATED_PAD | Freq: Once | CUTANEOUS | Status: DC

## 2020-08-07 MED ORDER — MIDAZOLAM HCL 2 MG/2ML IJ SOLN
INTRAMUSCULAR | Status: DC | PRN
  Administered 2020-08-07: 2 mg via INTRAVENOUS

## 2020-08-07 MED ORDER — FENTANYL CITRATE (PF) 250 MCG/5ML IJ SOLN
INTRAMUSCULAR | Status: AC
  Filled 2020-08-07: qty 5

## 2020-08-07 MED ORDER — SUCCINYLCHOLINE CHLORIDE 200 MG/10ML IV SOSY
PREFILLED_SYRINGE | INTRAVENOUS | Status: AC
  Filled 2020-08-07: qty 10

## 2020-08-07 MED ORDER — SUCCINYLCHOLINE CHLORIDE 20 MG/ML IJ SOLN
INTRAMUSCULAR | Status: DC | PRN
  Administered 2020-08-07: 140 mg via INTRAVENOUS

## 2020-08-07 MED ORDER — PROPOFOL 10 MG/ML IV BOLUS
INTRAVENOUS | Status: DC | PRN
  Administered 2020-08-07: 350 mg via INTRAVENOUS

## 2020-08-07 MED ORDER — DEXAMETHASONE SODIUM PHOSPHATE 4 MG/ML IJ SOLN
INTRAMUSCULAR | Status: DC | PRN
  Administered 2020-08-07: 10 mg via INTRAVENOUS

## 2020-08-07 MED ORDER — SCOPOLAMINE 1 MG/3DAYS TD PT72
MEDICATED_PATCH | TRANSDERMAL | Status: AC
  Filled 2020-08-07: qty 1

## 2020-08-07 MED ORDER — FENTANYL CITRATE (PF) 100 MCG/2ML IJ SOLN
INTRAMUSCULAR | Status: DC | PRN
  Administered 2020-08-07: 100 ug via INTRAVENOUS
  Administered 2020-08-07: 50 ug via INTRAVENOUS
  Administered 2020-08-07: 100 ug via INTRAVENOUS

## 2020-08-07 MED ORDER — CHLORHEXIDINE GLUCONATE 0.12 % MT SOLN
OROMUCOSAL | Status: AC
  Filled 2020-08-07: qty 15

## 2020-08-07 MED ORDER — ONDANSETRON HCL 4 MG/2ML IJ SOLN
INTRAMUSCULAR | Status: DC | PRN
  Administered 2020-08-07: 4 mg via INTRAVENOUS

## 2020-08-07 MED ORDER — MEPERIDINE HCL 50 MG/ML IJ SOLN: 6.2500 mg | INTRAMUSCULAR | Status: DC | PRN

## 2020-08-07 MED ORDER — EPHEDRINE 5 MG/ML INJ
INTRAVENOUS | Status: AC
  Filled 2020-08-07: qty 10

## 2020-08-07 MED ORDER — SUGAMMADEX SODIUM 200 MG/2ML IV SOLN
INTRAVENOUS | Status: DC | PRN
  Administered 2020-08-07: 240 mg via INTRAVENOUS

## 2020-08-07 MED ORDER — ORAL CARE MOUTH RINSE: 15.0000 mL | Freq: Once | OROMUCOSAL | Status: AC

## 2020-08-07 MED ORDER — SCOPOLAMINE 1 MG/3DAYS TD PT72
1.0000 | MEDICATED_PATCH | Freq: Once | TRANSDERMAL | Status: DC
  Administered 2020-08-07: 1.5 mg via TRANSDERMAL

## 2020-08-07 MED ORDER — CEFAZOLIN SODIUM-DEXTROSE 2-4 GM/100ML-% IV SOLN
INTRAVENOUS | Status: AC
  Filled 2020-08-07: qty 100

## 2020-08-07 MED ORDER — LIDOCAINE 2% (20 MG/ML) 5 ML SYRINGE
INTRAMUSCULAR | Status: AC
  Filled 2020-08-07: qty 5

## 2020-08-07 MED ORDER — PROPOFOL 10 MG/ML IV BOLUS
INTRAVENOUS | Status: AC
  Filled 2020-08-07: qty 20

## 2020-08-07 MED ORDER — MIDAZOLAM HCL 2 MG/2ML IJ SOLN
INTRAMUSCULAR | Status: AC
  Filled 2020-08-07: qty 2

## 2020-08-07 MED ORDER — ROCURONIUM BROMIDE 100 MG/10ML IV SOLN
INTRAVENOUS | Status: DC | PRN
  Administered 2020-08-07: 60 mg via INTRAVENOUS

## 2020-08-07 MED ORDER — DEXAMETHASONE SODIUM PHOSPHATE 10 MG/ML IJ SOLN
INTRAMUSCULAR | Status: AC
  Filled 2020-08-07: qty 1

## 2020-08-07 MED ORDER — LACTATED RINGERS IV SOLN: Freq: Once | INTRAVENOUS | Status: AC

## 2020-08-07 SURGICAL SUPPLY — 45 items
ADH SKN CLS APL DERMABOND .7 (GAUZE/BANDAGES/DRESSINGS) ×2
APL PRP STRL LF DISP 70% ISPRP (MISCELLANEOUS)
APL SWBSTK 6 STRL LF DISP (MISCELLANEOUS) ×2
APPLICATOR COTTON TIP 6 STRL (MISCELLANEOUS) ×2 IMPLANT
APPLICATOR COTTON TIP 6IN STRL (MISCELLANEOUS) ×3
BLADE SURG SZ11 CARB STEEL (BLADE) ×3 IMPLANT
CHLORAPREP W/TINT 26 (MISCELLANEOUS) IMPLANT
CLOTH BEACON ORANGE TIMEOUT ST (SAFETY) ×3 IMPLANT
COVER LIGHT HANDLE STERIS (MISCELLANEOUS) ×6 IMPLANT
COVER WAND RF STERILE (DRAPES) ×3 IMPLANT
DERMABOND ADVANCED (GAUZE/BANDAGES/DRESSINGS) ×1
DERMABOND ADVANCED .7 DNX12 (GAUZE/BANDAGES/DRESSINGS) ×2 IMPLANT
DRAIN PENROSE 0.5X18 (DRAIN) ×3 IMPLANT
ELECT REM PT RETURN 9FT ADLT (ELECTROSURGICAL) ×3
ELECTRODE REM PT RTRN 9FT ADLT (ELECTROSURGICAL) ×2 IMPLANT
GAUZE SPONGE 4X4 12PLY STRL (GAUZE/BANDAGES/DRESSINGS) ×3 IMPLANT
GLOVE BIOGEL PI IND STRL 7.0 (GLOVE) ×6 IMPLANT
GLOVE BIOGEL PI INDICATOR 7.0 (GLOVE) ×3
GLOVE SURG SS PI 7.5 STRL IVOR (GLOVE) ×3 IMPLANT
GOWN STRL REUS W/TWL LRG LVL3 (GOWN DISPOSABLE) ×9 IMPLANT
INST SET MINOR GENERAL (KITS) ×3 IMPLANT
KIT TURNOVER KIT A (KITS) ×3 IMPLANT
MANIFOLD NEPTUNE II (INSTRUMENTS) ×3 IMPLANT
MESH MARLEX PLUG MEDIUM (Mesh General) ×3 IMPLANT
NEEDLE HYPO 18GX1.5 BLUNT FILL (NEEDLE) ×3 IMPLANT
NEEDLE HYPO 22GX1.5 SAFETY (NEEDLE) ×3 IMPLANT
NS IRRIG 1000ML POUR BTL (IV SOLUTION) ×3 IMPLANT
PACK MINOR (CUSTOM PROCEDURE TRAY) ×3 IMPLANT
PAD ARMBOARD 7.5X6 YLW CONV (MISCELLANEOUS) ×3 IMPLANT
PENCIL SMOKE EVACUATOR (MISCELLANEOUS) ×3 IMPLANT
SET BASIN LINEN APH (SET/KITS/TRAYS/PACK) ×3 IMPLANT
SOL PREP PROV IODINE SCRUB 4OZ (MISCELLANEOUS) ×3 IMPLANT
SUT ETHIBOND NAB MO 7 #0 18IN (SUTURE) ×3 IMPLANT
SUT MNCRL AB 4-0 PS2 18 (SUTURE) ×6 IMPLANT
SUT NOVA NAB GS-22 2 2-0 T-19 (SUTURE) ×6 IMPLANT
SUT SILK 3 0 (SUTURE)
SUT SILK 3-0 18XBRD TIE 12 (SUTURE) IMPLANT
SUT VIC AB 2-0 CT1 27 (SUTURE) ×3
SUT VIC AB 2-0 CT1 TAPERPNT 27 (SUTURE) ×2 IMPLANT
SUT VIC AB 2-0 CT2 27 (SUTURE) ×3 IMPLANT
SUT VIC AB 3-0 SH 27 (SUTURE) ×6
SUT VIC AB 3-0 SH 27X BRD (SUTURE) ×4 IMPLANT
SUT VICRYL AB 2 0 TIES (SUTURE) IMPLANT
SUT VICRYL AB 3 0 TIES (SUTURE) IMPLANT
SYR 20ML LL LF (SYRINGE) ×6 IMPLANT

## 2020-08-07 NOTE — Discharge Instructions (Signed)
PLEASE WEAR THE [place] EXPAREL BRACELET UNTIL Tuesday August 11, 2020. DO NOT USE ANY ADDITIONAL NUMBING MEDICATIONS WITHOUT CONSULTING A PHYSICIAN UNTIL AFTER August 11, 2020.    PLEASE REMOVE SCOPOLAMINE PATCH ON Monday  AUGUST 23,2021. THIS HELPS WITH POSTOPERATIVE NAUSEA. REMOVE AND [place] HANDS AFTER REMOVAL ON Monday    Open Hernia Repair, Adult, Care After This sheet gives you information about how to care for yourself after your procedure. Your health care provider may also give you more specific instructions. If you have problems or questions, contact your health care provider. What can I expect after the procedure? After the procedure, it is common to have:  Mild discomfort.  Slight bruising.  Minor swelling.  Pain in the abdomen. Follow these instructions at home: Incision care   Follow instructions from your health care provider about how to take care of your incision area. Make sure you: ? Wash your hands with soap and water before you change your bandage (dressing). If soap and water are not available, use hand sanitizer. ? Change your dressing as told by your health care provider. ? Leave stitches (sutures), skin glue, or adhesive strips in place. These skin closures may need to stay in place for 2 weeks or longer. If adhesive strip edges start to loosen and curl up, you may trim the loose edges. Do not remove adhesive strips completely unless your health care provider tells you to do that.  Check your incision area every day for signs of infection. Check for: ? More redness, swelling, or pain. ? More fluid or blood. ? Warmth. ? Pus or a bad smell. Activity  Do not drive or use heavy machinery while taking prescription pain medicine. Do not drive until your health care provider approves.  Until your health care provider approves: ? Do not lift anything that is heavier than 10 lb (4.5 kg). ? Do not play contact sports.  Return to your normal activities as told  by your health care provider. Ask your health care provider what activities are safe. General instructions  To prevent or treat constipation while you are taking prescription pain medicine, your health care provider may recommend that you: ? Drink enough fluid to keep your urine clear or pale yellow. ? Take over-the-counter or prescription medicines. ? Eat foods that are high in fiber, such as fresh fruits and vegetables, whole grains, and beans. ? Limit foods that are high in fat and processed sugars, such as fried and sweet foods.  Take over-the-counter and prescription medicines only as told by your health care provider.  Do not take tub baths or go swimming until your health care provider approves.  Keep all follow-up visits as told by your health care provider. This is important. Contact a health care provider if:  You develop a rash.  You have more redness, swelling, or pain around your incision.  You have more fluid or blood coming from your incision.  Your incision feels warm to the touch.  You have pus or a bad smell coming from your incision.  You have a fever or chills.  You have blood in your stool (feces).  You have not had a bowel movement in 2-3 days.  Your pain is not controlled with medicine. Get help right away if:  You have chest pain or shortness of breath.  You feel light-headed or feel faint.  You have severe pain.  You vomit and your pain is worse. This information is not intended to replace advice  given to you by your health care provider. Make sure you discuss any questions you have with your health care provider. Document Revised: 11/17/2017 Document Reviewed: 05/18/2016 Elsevier Patient Education  2020 [place] Anesthesia, Adult, Care After This sheet gives you information about how to care for yourself after your procedure. Your health care provider may also give you more specific instructions. If you have problems or  questions, contact your health care provider. What can I expect after the procedure? After the procedure, the following side effects are common:  Pain or discomfort at the IV site.  Nausea.  Vomiting.  Sore throat.  Trouble concentrating.  Feeling cold or chills.  Weak or tired.  Sleepiness and fatigue.  Soreness and body aches. These side effects can affect parts of the body that were not involved in surgery. Follow these instructions at home:  For at least 24 hours after the procedure:  Have a responsible adult stay with you. It is important to have someone help care for you until you are awake and alert.  Rest as needed.  Do not: ? Participate in activities in which you could fall or become injured. ? Drive. ? Use heavy machinery. ? Drink alcohol. ? Take sleeping pills or medicines that cause drowsiness. ? Make important decisions or sign legal documents. ? Take care of children on your own. Eating and drinking  Follow any instructions from your health care provider about eating or drinking restrictions.  When you feel hungry, start by eating small amounts of foods that are soft and easy to digest (bland), such as toast. Gradually return to your regular diet.  Drink enough fluid to keep your urine pale yellow.  If you vomit, rehydrate by drinking water, juice, or clear broth. General instructions  If you have sleep apnea, surgery and certain medicines can increase your risk for breathing problems. Follow instructions from your health care provider about wearing your sleep device: ? Anytime you are sleeping, including during daytime naps. ? While taking prescription pain medicines, sleeping medicines, or medicines that make you drowsy.  Return to your normal activities as told by your health care provider. Ask your health care provider what activities are safe for you.  Take over-the-counter and prescription medicines only as told by your health care  provider.  If you smoke, do not smoke without supervision.  Keep all follow-up visits as told by your health care provider. This is important. Contact a health care provider if:  You have nausea or vomiting that does not get better with medicine.  You cannot eat or drink without vomiting.  You have pain that does not get better with medicine.  You are unable to pass urine.  You develop a skin rash.  You have a fever.  You have redness around your IV site that gets worse. Get help right away if:  You have difficulty breathing.  You have chest pain.  You have blood in your urine or stool, or you vomit blood. Summary  After the procedure, it is common to have a sore throat or nausea. It is also common to feel tired.  Have a responsible adult stay with you for the first 24 hours after general anesthesia. It is important to have someone help care for you until you are awake and alert.  When you feel hungry, start by eating small amounts of foods that are soft and easy to digest (bland), such as toast. Gradually return to your regular  diet.  Drink enough fluid to keep your urine pale yellow.  Return to your normal activities as told by your health care provider. Ask your health care provider what activities are safe for you. This information is not intended to replace advice given to you by your health care provider. Make sure you discuss any questions you have with your health care provider. Document Revised: 12/08/2017 Document Reviewed: 07/21/2017 Elsevier Patient Education  [place].

## 2020-08-07 NOTE — Anesthesia Procedure Notes (Signed)
Procedure Name: Intubation Date/Time: 08/07/2020 8:33 AM Performed by: Hewitt Blade, CRNA Pre-anesthesia Checklist: Patient identified, Emergency Drugs available, Suction available and Patient being monitored Patient Re-evaluated:Patient Re-evaluated prior to induction Oxygen Delivery Method: Circle system utilized Preoxygenation: Pre-oxygenation with 100% oxygen Induction Type: IV induction Ventilation: Mask ventilation without difficulty and Oral airway inserted - appropriate to patient size Laryngoscope Size: Mac and 4 Grade View: Grade I Tube type: Oral Tube size: 7.5 mm Number of attempts: 1 Airway Equipment and Method: Stylet and Oral airway Placement Confirmation: ETT inserted through vocal cords under direct vision,  positive ETCO2 and breath sounds checked- equal and bilateral Secured at: 23 cm Tube secured with: Tape Dental Injury: Teeth and Oropharynx as per pre-operative assessment

## 2020-08-07 NOTE — Transfer of Care (Signed)
Immediate Anesthesia Transfer of Care Note  Patient: Chase Wyatt  Procedure(s) Performed: HERNIA REPAIR UMBILICAL ADULT (N/A Abdomen) HERNIA REPAIR INGUINAL ADULT using MESH MARLEX PLUG MEDIUM (Left Abdomen)  Patient Location: PACU  Anesthesia Type:General  Level of Consciousness: awake  Airway & Oxygen Therapy: Patient Spontanous Breathing  Post-op Assessment: Report given to RN and Post -op Vital signs reviewed and stable  Post vital signs: Reviewed and stable  Last Vitals:  Vitals Value Taken Time  BP 143/74 08/07/20 0955  Temp    Pulse 67 08/07/20 0956  Resp 20 08/07/20 0956  SpO2 100 % 08/07/20 0956  Vitals shown include unvalidated device data.  Last Pain:  Vitals:   08/07/20 0746  TempSrc: Oral  PainSc: 5       Patients Stated Pain Goal: 8 (97/91/50 4136)  Complications: No complications documented.

## 2020-08-07 NOTE — Interval H&P Note (Signed)
History and Physical Interval Note:  08/07/2020 8:06 AM  Chase Wyatt  has presented today for surgery, with the diagnosis of Umbilical hernia Left Inguinal hernia.  The various methods of treatment have been discussed with the patient and family. After consideration of risks, benefits and other options for treatment, the patient has consented to  Procedure(s) with comments: [place] (N/A) - pt knows to arrive at 7:00 [place] (Left) as a surgical intervention.  The patient's history has been reviewed, patient examined, no change in status, stable for surgery.  I have reviewed the patient's chart and labs.  Questions were answered to the patient's satisfaction.     Aviva Signs

## 2020-08-07 NOTE — Op Note (Signed)
Patient:  Chase Wyatt  DOB:  Nov 08, 1971  MRN:  [id number]   Preop Diagnosis: Left inguinal hernia, umbilical hernia  Postop Diagnosis: Same  Procedure: Left inguinal herniorrhaphy with mesh, umbilical herniorrhaphy  Surgeon: Aviva Signs, MD  Anes: General endotracheal  Indications: Patient is a 49 year old white male who presents with both a symptomatic left inguinal hernia and an umbilical hernia.  The risks and benefits of the procedures including bleeding, infection, pain, and the possibility of recurrence of the hernias as well as mesh use were fully explained to the patient, who gave informed consent.  Procedure note: The patient was placed in the supine position.  After induction of general endotracheal anesthesia, the abdomen was prepped and draped using the usual sterile technique with [employer]are.  Surgical site confirmation was performed.  An incision was made in the left groin region down to the external oblique aponeurosis.  The aponeurosis was incised to the external ring.  A Penrose drain was placed around the spermatic cord.  The vas deferens was noted within the spermatic cord.  The ilioinguinal nerve was identified and retracted superiorly from the operative field.  The patient had an indirect hernia.  This was incised circumferentially and reduced.  A medium size Bard PerFix plug was then inserted.  An onlay patch was placed along the floor of the inguinal canal and secured superiorly to the conjoined tendon and inferiorly to the shelving edge of Poupart's ligament using 2-0 Novafil interrupted sutures.  The internal ring was recreated using a 2-0 Novafil interrupted suture.  The external oblique aponeurosis was reapproximated using a 2-0 Vicryl running suture.  The subcutaneous layer was reapproximated using a 3-0 Vicryl interrupted suture.  Exparel was instilled into the surrounding wound.  The skin was closed using a 4-0 Monocryl subcuticular suture.  Next, I turned my  attention to the umbilical hernia.  A supraumbilical incision was made through a previous surgical scar down to the fascia.  The umbilicus was freed away from the underlying fascia.  This did appear to be a true umbilical hernia and not an incisional hernia.  The defect measured only 5 to 7 mm in its greatest diameter.  The properitoneal fat was reduced and the hernia defect was closed transversely using 0 Ethibond interrupted sutures.  The base the umbilicus was secured back to the fascia using a 2-0 Vicryl interrupted suture.  Subcutaneous layer was reapproximated using 3-0 Vicryl interrupted suture.  Exparel was instilled into the surrounding wound.  The skin was closed using a 4-0 Monocryl subcuticular suture.  Dermabond was applied to both incisions.  All tape and needle counts were correct at the end of the procedures.  The patient was extubated in the operating room and transferred to PACU in stable condition.  Complications: None  EBL: Minimal  Specimen: None

## 2020-08-07 NOTE — Anesthesia Postprocedure Evaluation (Signed)
Anesthesia Post Note  Patient: Chase Wyatt  Procedure(s) Performed: HERNIA REPAIR UMBILICAL ADULT (N/A Abdomen) HERNIA REPAIR INGUINAL ADULT using MESH MARLEX PLUG MEDIUM (Left Abdomen)  Patient location during evaluation: PACU Anesthesia Type: General Level of consciousness: awake Pain management: pain level controlled Vital Signs Assessment: post-procedure vital signs reviewed and stable Respiratory status: spontaneous breathing, nonlabored ventilation and respiratory function stable Cardiovascular status: stable Postop Assessment: no apparent nausea or vomiting Anesthetic complications: no   No complications documented.   Last Vitals:  Vitals:   08/07/20 0746  BP: 134/73  Pulse: 60  Resp: 18  Temp: 36.8 C  SpO2: 100%    Last Pain:  Vitals:   08/07/20 0746  TempSrc: Oral  PainSc: [place]

## 2020-08-07 NOTE — Anesthesia Preprocedure Evaluation (Signed)
Anesthesia Evaluation  Patient identified by MRN, date of birth, ID band Patient awake    Reviewed: Allergy & Precautions, NPO status , Patient's Chart, lab work & pertinent test results  History of Anesthesia Complications (+) PONV, PROLONGED EMERGENCE and history of anesthetic complications  Airway Mallampati: III  TM Distance: >3 FB Neck ROM: Full    Dental  (+) Dental Advisory Given, Missing   Pulmonary sleep apnea and Continuous Positive Airway Pressure Ventilation ,    Pulmonary exam normal breath sounds clear to auscultation       Cardiovascular Exercise Tolerance: Good hypertension, Pt. on medications + Valvular Problems/Murmurs  Rhythm:Regular Rate:Normal     Neuro/Psych Anxiety  Neuromuscular disease    GI/Hepatic GERD  Medicated and Controlled,(+)     substance abuse  alcohol use,   Endo/Other  negative endocrine ROS  Renal/GU negative Renal ROS     Musculoskeletal  (+) Arthritis  (thoracic laminectomy, spinal cord stimulator), thoracic laminectomy, spinal cord stimulato   Abdominal   Peds  Hematology   Anesthesia Other Findings   Reproductive/Obstetrics                            Anesthesia Physical Anesthesia Plan  ASA: III  Anesthesia Plan: General   Post-op Pain Management:    Induction:   PONV Risk Score and Plan: 4 or greater and Treatment may vary due to age or medical condition, Ondansetron, Dexamethasone, Midazolam and Scopolamine patch - Pre-op  Airway Management Planned: Oral ETT  Additional Equipment:   Intra-op Plan:   Post-operative Plan: Extubation in OR  Informed Consent: I have reviewed the patients History and Physical, chart, labs and discussed the procedure including the risks, benefits and alternatives for the proposed anesthesia with the patient or authorized representative who has indicated his/her understanding and acceptance.      Dental advisory given  Plan Discussed with: CRNA and Surgeon  Anesthesia Plan Comments:        Anesthesia Quick Evaluation

## 2020-08-10 ENCOUNTER — Encounter (HOSPITAL_COMMUNITY): Payer: Self-pay | Admitting: General Surgery

## 2020-08-12 ENCOUNTER — Telehealth: Payer: Self-pay

## 2020-08-12 NOTE — Telephone Encounter (Signed)
Spoke with patients wife- surgery Friday- wife states he has developed thrush-she stated she has called PCP and they will be calling medication in due to his past history of having Thrush.

## 2020-08-20 ENCOUNTER — Encounter: Payer: Self-pay | Admitting: General Surgery

## 2020-08-20 ENCOUNTER — Other Ambulatory Visit: Payer: Self-pay | Admitting: Family Medicine

## 2020-08-20 ENCOUNTER — Ambulatory Visit (INDEPENDENT_AMBULATORY_CARE_PROVIDER_SITE_OTHER): Payer: BC Managed Care – PPO | Admitting: General Surgery

## 2020-08-20 ENCOUNTER — Other Ambulatory Visit: Payer: Self-pay

## 2020-08-20 VITALS — BP 126/76 | HR 62 | Temp 98.1°F | Resp 12 | Ht 76.0 in | Wt 272.0 lb

## 2020-08-20 DIAGNOSIS — Z09 Encounter for follow-up examination after completed treatment for conditions other than malignant neoplasm: Secondary | ICD-10-CM

## 2020-08-20 DIAGNOSIS — K4090 Unilateral inguinal hernia, without obstruction or gangrene, not specified as recurrent: Secondary | ICD-10-CM

## 2020-08-20 NOTE — Progress Notes (Signed)
Subjective:     Chase Wyatt  Here for follow-up, status post left inguinal herniorrhaphy with mesh.  Patient is having intermittent burning sensation and numbness in the left groin region which is slightly getting better.  He is not taking any narcotics.  He is on ibuprofen for pain control.  He has been on this in the past for his back.  He is gradually increasing his activity as able. Objective:    BP 126/76   Pulse 62   Temp 98.1 F (36.7 C) (Oral)   Resp 12   Ht 6\' 4"  (1.93 m)   Wt 272 lb (123.4 kg)   SpO2 98%   BMI 33.11 kg/m   General:  alert, cooperative and no distress  Abdomen soft, incision healing well     Assessment:    Doing well postoperatively.    Plan:   Gradually increase activity.  We will see him back in 2 weeks for follow-up.  He is to be out of work until 09/07/2020.

## 2020-09-03 ENCOUNTER — Ambulatory Visit (INDEPENDENT_AMBULATORY_CARE_PROVIDER_SITE_OTHER): Payer: BC Managed Care – PPO | Admitting: General Surgery

## 2020-09-03 ENCOUNTER — Encounter: Payer: Self-pay | Admitting: General Surgery

## 2020-09-03 ENCOUNTER — Other Ambulatory Visit: Payer: Self-pay | Admitting: Family Medicine

## 2020-09-03 ENCOUNTER — Other Ambulatory Visit: Payer: Self-pay

## 2020-09-03 VITALS — BP 143/85 | HR 60 | Temp 98.3°F | Resp 16 | Ht 76.0 in | Wt 271.0 lb

## 2020-09-03 DIAGNOSIS — K4090 Unilateral inguinal hernia, without obstruction or gangrene, not specified as recurrent: Secondary | ICD-10-CM

## 2020-09-03 DIAGNOSIS — Z09 Encounter for follow-up examination after completed treatment for conditions other than malignant neoplasm: Secondary | ICD-10-CM

## 2020-09-03 NOTE — Progress Notes (Signed)
Subjective:     Chase Wyatt  Here for postoperative visit, status post left inguinal herniorrhaphy, umbilical herniorrhaphy.  Patient does have mild incisional pain but it is resolving.  He has been increasing his activity as able. Objective:    BP (!) 143/85   Pulse 60   Temp 98.3 F (36.8 C) (Oral)   Resp 16   Ht 6\' 4"  (1.93 m)   Wt 271 lb (122.9 kg)   SpO2 97%   BMI 32.99 kg/m   General:  alert, cooperative and no distress  Abdomen soft, incisions healing well.     Assessment:    Doing well postoperatively.    Plan:   Continue increasing activity as able.  May return to work without restrictions on 09/21/2020.  Follow-up here as needed.

## 2020-12-15 ENCOUNTER — Other Ambulatory Visit: Payer: Self-pay

## 2020-12-15 ENCOUNTER — Encounter: Payer: Self-pay | Admitting: General Surgery

## 2020-12-15 ENCOUNTER — Ambulatory Visit (INDEPENDENT_AMBULATORY_CARE_PROVIDER_SITE_OTHER): Payer: BC Managed Care – PPO | Admitting: General Surgery

## 2020-12-15 VITALS — BP 125/86 | HR 69 | Temp 98.1°F | Resp 16 | Ht 76.0 in | Wt 273.0 lb

## 2020-12-15 DIAGNOSIS — R1031 Right lower quadrant pain: Secondary | ICD-10-CM | POA: Insufficient documentation

## 2020-12-15 DIAGNOSIS — K4090 Unilateral inguinal hernia, without obstruction or gangrene, not specified as recurrent: Secondary | ICD-10-CM

## 2020-12-15 NOTE — Patient Instructions (Signed)
Will get a CT to further evaluate.  Andrey Campanile will call to get this okayed with insurance and get it scheduled. Will call with results.

## 2020-12-15 NOTE — Progress Notes (Signed)
Chase Wyatt History and Physical  Reason for Referral: Bilateral groin pain   Chief Complaint    Hernia      Chase Wyatt is a 49 y.o. male.  HPI: Chase Wyatt is a 49 yo with a history of left inguinal and umbilical hernia repair with Dr. Lovell Sheehan 07/2020. He did well post operatively and has not had issues until he was exercising a few weeks ago using his bodyweight doing lunges etc, and he felt a pull in the right groin. He has since felt pain in the right groin down into the thigh region and even felt swelling in the thigh region. He has also moved and turned and now feels a pulling in the left groin that is new.  He denies noticing an overt bulge anyway but he does feel like his thigh is getting swollen on the right. He has no associated nausea or vomiting or obstructive symptoms.   Past Medical History:  Diagnosis Date  . Anxiety   . Arthritis   . GERD (gastroesophageal reflux disease)   . Heart murmur   . Hypertension   . Neuromuscular disorder (HCC)    nerve damage right leg  . PONV (postoperative nausea and vomiting)   . Sleep apnea     Past Surgical History:  Procedure Laterality Date  . ANKLE DEBRIDEMENT  3 yrs ago -[place]   left  . 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 BACK SURGERY     multiple  . CARDIAC CATHETERIZATION  2013   [place], no stents  . CHOLECYSTECTOMY  10/28/2011   Procedure: LAPAROSCOPIC CHOLECYSTECTOMY;  Surgeon: Chase Wyatt;  Location: [facility];  Service: General;  Laterality: N/A;  . INGUINAL HERNIA REPAIR Left 08/07/2020   Procedure: HERNIA REPAIR INGUINAL ADULT using MESH MARLEX PLUG MEDIUM;  Surgeon: Chase Macho, MD;  Location: [facility];  Service: General;  Laterality: Left;  . LAMINECTOMY THORACIC SPINE W/ PLACEMENT SPINAL CORD STIMULATOR    . SHOULDER OPEN ROTATOR CUFF REPAIR Left    [place]  . UMBILICAL HERNIA REPAIR N/A 08/07/2020   Procedure: HERNIA REPAIR UMBILICAL ADULT;  Surgeon:  Chase Macho, MD;  Location: [facility];  Service: General;  Laterality: N/A;    Family History  Problem Relation Age of Onset  . Hypotension Neg Hx   . Anesthesia problems Neg Hx   . Malignant hyperthermia Neg Hx   . Pseudochol deficiency Neg Hx     Social History   Tobacco Use  . Smoking status: Never Smoker  . Smokeless tobacco: Former Engineer, water Use Topics  . Alcohol use: Yes    Alcohol/week: 1.0 standard drink    Types: 1 Cans of beer per week    Comment: occassional  . Drug use: No    Medications: I have reviewed the patient's current medications. Allergies as of 12/15/2020      Reactions   Benzoin Rash   Blisters   Freederm Adhesive Remover [new Skin]    Meloxicam Hives   Oxycontin [oxycodone] Other (See Comments)   Headache      Medication List       Accurate as of December 15, 2020  2:04 PM. If you have any questions, ask your nurse or doctor.        ALPRAZolam 0.5 MG tablet Commonly known as: XANAX Take 0.5 mg by mouth daily as needed for anxiety or sleep.   eletriptan 40 MG tablet Commonly known as:  RELPAX Take 40 mg by mouth daily as needed for migraine.   levocetirizine 5 MG tablet Commonly known as: XYZAL Take 5 mg by mouth daily.   mometasone 50 MCG/ACT nasal spray Commonly known as: NASONEX Place 2 sprays into the nose daily.   multivitamin tablet Take 1 tablet by mouth daily.   naproxen 500 MG tablet Commonly known as: NAPROSYN Take 500 mg by mouth daily as needed for migraine.   omeprazole 40 MG capsule Commonly known as: PRILOSEC Take 40 mg by mouth daily.   testosterone cypionate 200 MG/ML injection Commonly known as: DEPOTESTOSTERONE CYPIONATE Inject 200 mg into the muscle every 14 (fourteen) days.        ROS:  A comprehensive review of systems was negative except for: Musculoskeletal: positive for bilateral groin pain  Blood pressure 125/86, pulse 69, temperature 98.1 F (36.7 C), temperature source Oral, resp.  rate 16, height 6\' 4"  (1.93 m), weight 273 lb (123.8 kg), SpO2 98 %. Physical Exam Vitals reviewed.  Constitutional:      Appearance: He is normal weight.  HENT:     Head: Normocephalic.     Nose: Nose normal.     Mouth/Throat:     Mouth: Mucous membranes are moist.  Eyes:     Extraocular Movements: Extraocular movements intact.  Cardiovascular:     Rate and Rhythm: Normal rate and regular rhythm.  Pulmonary:     Effort: Pulmonary effort is normal.     Breath sounds: Normal breath sounds.  Abdominal:     General: There is no distension.     Palpations: Abdomen is soft.     Tenderness: There is abdominal tenderness.     Hernia: No hernia is present.     Comments: Umbilical hernia site healed, no hernia, no hernia appreciated on left or right side with valsalva or standing, patient is tender in right inguinal region, no bulge in the thigh noted, left inguinal incision healed, no recurrent hernia felt, tender inferior  Musculoskeletal:        General: No swelling. Normal range of motion.     Cervical back: Normal range of motion.  Skin:    General: Skin is warm.  Neurological:     General: No focal deficit present.     Mental Status: He is alert and oriented to person, place, and time.  Psychiatric:        Mood and Affect: Mood normal.        Behavior: Behavior normal.        Thought Content: Thought content normal.        Judgment: Judgment normal.     Results: Report OSH Imaging but cannot see in our system or on [place] he reports   Assessment & Plan:  Chase Wyatt is a 49 y.o. male with bilateral groin pain. Right is worse than left and he feels swelling. There is no obvious hernia but with the swelling into the thigh it makes me worry about a femoral type hernia. He has not obvious recurrence on the left.   Will get a CT to further evaluate both sides given his complicates.   Lovey Newcomer will call to get this okayed with insurance and get it  scheduled. Will call with results.  All questions were answered to the satisfaction of the patient.     Chase Wyatt 12/15/2020, 2:04 PM

## 2020-12-25 ENCOUNTER — Ambulatory Visit (HOSPITAL_COMMUNITY)
Admission: RE | Admit: 2020-12-25 | Discharge: 2020-12-25 | Disposition: A | Discharge status: Home / Self Care | Payer: BC Managed Care – PPO | Source: Ambulatory Visit | Attending: General Surgery | Admitting: General Surgery

## 2020-12-25 ENCOUNTER — Other Ambulatory Visit: Payer: Self-pay

## 2020-12-25 DIAGNOSIS — R1031 Right lower quadrant pain: Secondary | ICD-10-CM | POA: Insufficient documentation

## 2020-12-25 DIAGNOSIS — K4090 Unilateral inguinal hernia, without obstruction or gangrene, not specified as recurrent: Secondary | ICD-10-CM | POA: Insufficient documentation

## 2020-12-25 MED ORDER — IOHEXOL 300 MG/ML  SOLN
100.0000 mL | Freq: Once | INTRAMUSCULAR | Status: AC | PRN
  Administered 2020-12-25: 100 mL via INTRAVENOUS

## 2020-12-28 ENCOUNTER — Telehealth (INDEPENDENT_AMBULATORY_CARE_PROVIDER_SITE_OTHER): Payer: BC Managed Care – PPO | Admitting: General Surgery

## 2020-12-28 DIAGNOSIS — K4090 Unilateral inguinal hernia, without obstruction or gangrene, not specified as recurrent: Secondary | ICD-10-CM

## 2020-12-28 DIAGNOSIS — R1031 Right lower quadrant pain: Secondary | ICD-10-CM

## 2020-12-28 NOTE — Telephone Encounter (Signed)
[facility]  CT pelvis with no recurrence on left. Small right sided fat containing direct inguinal hernia.   Left patient an message about the results. Told him Dr. Arnoldo Morale or myself would be happy to repair.   Asked him to call the office and let him know what he decides.   Curlene Labrum, MD [facility] [address] [place], [place] [id number] [phone] (office)

## 2020-12-29 NOTE — Telephone Encounter (Signed)
Sent mychart message

## 2020-12-30 NOTE — Telephone Encounter (Signed)
Apt has been scheduled.

## 2020-12-31 ENCOUNTER — Other Ambulatory Visit: Payer: Self-pay

## 2020-12-31 ENCOUNTER — Ambulatory Visit (INDEPENDENT_AMBULATORY_CARE_PROVIDER_SITE_OTHER): Payer: BC Managed Care – PPO | Admitting: General Surgery

## 2020-12-31 ENCOUNTER — Encounter: Payer: Self-pay | Admitting: General Surgery

## 2020-12-31 VITALS — BP 145/90 | HR 59 | Temp 97.3°F | Resp 14 | Ht 76.0 in | Wt 275.0 lb

## 2020-12-31 DIAGNOSIS — K4090 Unilateral inguinal hernia, without obstruction or gangrene, not specified as recurrent: Secondary | ICD-10-CM | POA: Diagnosis not present

## 2020-12-31 NOTE — Patient Instructions (Signed)
Inguinal Hernia, Adult An inguinal hernia develops when fat or the intestines push through a weak spot in a muscle where the leg meets the lower abdomen (groin). This creates a bulge. This kind of hernia could also be:  In the scrotum, if you are male.  In folds of skin around the vagina, if you are male. There are three types of inguinal hernias:  Hernias that can be pushed back into the abdomen (are reducible). This type rarely causes pain.  Hernias that are not reducible (are incarcerated).  Hernias that are not reducible and lose their blood supply (are strangulated). This type of hernia requires emergency surgery. What are the causes? This condition is caused by having a weak spot in the muscles or tissues in your groin. This develops over time. The hernia may poke through the weak spot when you suddenly strain your lower abdominal muscles, such as when you:  Lift a heavy object.  Strain to have a bowel movement. Constipation can lead to straining.  Cough. What increases the risk? This condition is more likely to develop in:  Males.  Pregnant females.  People who: ? Are overweight. ? Work in jobs that require long periods of standing or heavy lifting. ? Have had an inguinal hernia before. ? Smoke or have lung disease. These factors can lead to long-term (chronic) coughing. What are the signs or symptoms? Symptoms may depend on the size of the hernia. Often, a small inguinal hernia has no symptoms. Symptoms of a larger hernia may include:  A bulge in the groin area. This is easier to see when standing. It might not be visible when lying down.  Pain or burning in the groin. This may get worse when lifting, straining, or coughing.  A dull ache or a feeling of pressure in the groin.  An unusual bulge in the scrotum, in males. Symptoms of a strangulated inguinal hernia may include:  A bulge in your groin that is very painful and tender to the touch.  A bulge that  turns red or purple.  Fever, nausea, and vomiting.  Inability to have a bowel movement or to pass gas. How is this diagnosed? This condition is diagnosed based on your symptoms, your medical history, and a physical exam. Your health care provider may feel your groin area and ask you to cough. How is this treated? Treatment depends on the size of your hernia and whether you have symptoms. If you do not have symptoms, your health care provider may have you watch your hernia carefully and have you come in for follow-up visits. If your hernia is large or if you have symptoms, you may need surgery to repair the hernia. Follow these instructions at home: Lifestyle  Avoid lifting heavy objects.  Avoid standing for long periods of time.  Do not use any products that contain nicotine or tobacco. These products include cigarettes, chewing tobacco, and vaping devices, such as e-cigarettes. If you need help quitting, ask your health care provider.  Maintain a healthy weight. Preventing constipation You may need to take these actions to prevent or treat constipation:  Drink enough fluid to keep your urine pale yellow.  Take over-the-counter or prescription medicines.  Eat foods that are high in fiber, such as beans, whole grains, and fresh fruits and vegetables.  Limit foods that are high in fat and processed sugars, such as fried or sweet foods. General instructions  You may try to push the hernia back in place by very gently   pressing on it while lying down. Do not try to force the bulge back in if it will not push in easily.  Watch your hernia for any changes in shape, size, or color. Get help right away if you notice any changes.  Take over-the-counter and prescription medicines only as told by your health care provider.  Keep all follow-up visits. This is important. Contact a health care provider if:  You have a fever or chills.  You develop new symptoms.  Your symptoms get  worse. Get help right away if:  You have pain in your groin that suddenly gets worse.  You have a bulge in your groin that: ? Suddenly gets bigger and does not get smaller. ? Becomes red or purple or painful to the touch.  You are a man and you have a sudden pain in your scrotum, or the size of your scrotum suddenly changes.  You cannot push the hernia back in place by very gently pressing on it when you are lying down.  You have nausea or vomiting that does not go away.  You have a fast heartbeat.  You cannot have a bowel movement or pass gas. These symptoms may represent a serious problem that is an emergency. Do not wait to see if the symptoms will go away. Get medical help right away. Call your local emergency services (911 in the [place] Summary  An inguinal hernia develops when fat or the intestines push through a weak spot in a muscle where your leg meets your lower abdomen (groin).  This condition is caused by having a weak spot in muscles or tissues in your groin.  Symptoms may depend on the size of the hernia, and they may include pain or swelling in your groin. A small inguinal hernia often has no symptoms.  Treatment may not be needed if you do not have symptoms. If you have symptoms or a large hernia, you may need surgery to repair the hernia.  Avoid lifting heavy objects. Also, avoid standing for long periods of time. This information is not intended to replace advice given to you by your health care provider. Make sure you discuss any questions you have with your health care provider. Document Revised: 08/04/2020 Document Reviewed: 08/04/2020 Elsevier Patient Education  2021 Elsevier Inc.  

## 2020-12-31 NOTE — Progress Notes (Signed)
Subjective:     Chase Wyatt  Here for follow-up on right inguinal hernia. Patient continues to have pain and sensitivity in the right groin region extending to the right testicle and right inner thigh. CT scan confirmed a fat-containing right inguinal hernia. Patient would like to proceed with surgery. Objective:    BP (!) 145/90   Pulse (!) 59   Temp (!) 97.3 F (36.3 C) (Other (Comment))   Resp 14   Ht 6\' 4"  (1.93 m)   Wt 275 lb (124.7 kg)   SpO2 98%   BMI 33.47 kg/m   General:  alert, cooperative and no distress  Tenderness over the internal ring in the right groin region.     Assessment:    Right inguinal hernia    Plan:   Patient is scheduled for right inguinal herniorrhaphy with mesh on 01/11/2021. The risks and benefits of the procedure including bleeding, infection, mesh use, chronic pain, and the possibility of recurrence of the hernia were fully explained to the patient, who gave informed consent.

## 2020-12-31 NOTE — H&P (Signed)
Chase Wyatt is an 50 y.o. male.   Chief Complaint: Right inguinal hernia HPI: Patient is a 50 year old white male who was referred to our care for evaluation and treatment of right groin pain.  This started in early December.  The pain is now constant in nature, radiating to his right inner thigh and right testicle.  A CT scan of the abdomen was performed which revealed a fat-containing right inguinal hernia.  His previous left inguinal hernia repair is intact.  Past Medical History:  Diagnosis Date  . Anxiety   . Arthritis   . GERD (gastroesophageal reflux disease)   . Heart murmur   . Hypertension   . 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 PONV (postoperative nausea and vomiting)   . Sleep apnea     Past Surgical History:  Procedure Laterality Date  . ANKLE DEBRIDEMENT  3 yrs ago -[place]   left  . 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 BACK SURGERY     multiple  . CARDIAC CATHETERIZATION  2013   [place], no stents  . CHOLECYSTECTOMY  10/28/2011   Procedure: LAPAROSCOPIC CHOLECYSTECTOMY;  Surgeon: Jamesetta So;  Location: [facility];  Service: General;  Laterality: N/A;  . INGUINAL HERNIA REPAIR Left 08/07/2020   Procedure: HERNIA REPAIR INGUINAL ADULT using MESH MARLEX PLUG MEDIUM;  Surgeon: Aviva Signs, MD;  Location: [facility];  Service: General;  Laterality: Left;  . LAMINECTOMY THORACIC SPINE W/ PLACEMENT SPINAL CORD STIMULATOR    . SHOULDER OPEN ROTATOR CUFF REPAIR Left    [place]  . UMBILICAL HERNIA REPAIR N/A 08/07/2020   Procedure: HERNIA REPAIR UMBILICAL ADULT;  Surgeon: Aviva Signs, MD;  Location: [facility];  Service: General;  Laterality: N/A;    Family History  Problem Relation Age of Onset  . Hypotension Neg Hx   . Anesthesia problems Neg Hx   . Malignant hyperthermia Neg Hx   . Pseudochol deficiency Neg Hx    Social History:  reports that he has never smoked. He has quit using smokeless tobacco. He reports  current alcohol use of about 1.0 standard drink of alcohol per week. He reports that he does not use drugs.  Allergies:  Allergies  Allergen Reactions  . Benzoin Rash    Blisters  . Freederm Adhesive Remover [New Skin]   . Meloxicam Hives  . Oxycontin [Oxycodone] Other (See Comments)    Headache    No medications prior to admission.    No results found for this or any previous visit (from the past 48 hour(s)). No results found.  Review of Systems  Constitutional: Negative.   HENT: Negative.   Eyes: Negative.   Respiratory: Negative.   Cardiovascular: Negative.   Gastrointestinal: Positive for abdominal pain.  Endocrine: Negative.   Genitourinary: Negative.   Musculoskeletal: Negative.   Skin: Negative.   Allergic/Immunologic: Negative.   Neurological: Negative.   Hematological: Negative.   Psychiatric/Behavioral: Negative.     There were no vitals taken for this visit. Physical Exam Vitals reviewed.  Constitutional:      Appearance: Normal appearance. He is not ill-appearing.  HENT:     Head: Normocephalic and atraumatic.  Cardiovascular:     Rate and Rhythm: Normal rate and regular rhythm.     Heart sounds: Normal heart sounds. No murmur heard. No friction rub. No gallop.   Pulmonary:     Effort: Pulmonary effort is normal. No respiratory distress.  Breath sounds: Normal breath sounds. No stridor. No wheezing, rhonchi or rales.  Abdominal:     General: Bowel sounds are normal. There is no distension.     Palpations: Abdomen is soft. There is no mass.     Tenderness: There is no abdominal tenderness. There is no guarding or rebound.     Hernia: A hernia is present.     Comments: Pain to palpation over the right inguinal ring with a small reducible hernia present.  Genitourinary:    Comments: Genitourinary examination is within normal limits. Skin:    General: Skin is warm and dry.  Neurological:     Mental Status: He is alert and oriented to person,  place, and time.     CT scan results reviewed Assessment/Plan Impression: Right inguinal hernia Plan: Patient is scheduled for right inguinal herniorrhaphy with mesh on 01/11/2021.  The risk and benefits of the procedure including bleeding, infection, mesh use, chronic pain, and the possibility of recurrence of the hernia were fully explained to the patient, who gave informed consent.  Aviva Signs, MD 12/31/2020, 10:25 AM

## 2021-01-06 NOTE — Patient Instructions (Signed)
Chase Wyatt  01/06/2021     @PREFPERIOPPHARMACY @   Your procedure is scheduled on  Monday, January 11 2021.    Report to the Main Entrance at  [facility] at  [phone]  A.M. and check in at the day surgery registration desk.    Call this number if you have problems the morning of surgery:  [phone]      Remember:  Do not eat or drink after midnight.      Take these medicines the morning of surgery with A SIP OF WATER                Xanax(if needed), flexeril(if needed), relpax(if needed), xyzal, prilosec, zofran(if needed).      Do not wear jewelry, make-up or nail polish.  Do not wear lotions, powders, or perfumes, or deodorant.  Do not shave 48 hours prior to surgery.  Men may shave face and neck.  Do not bring valuables to the hospital.  [facility] is not responsible for any belongings or valuables.             Please brush your teeth.   Contacts, dentures or bridgework may not be worn into surgery.  Leave your suitcase in the car.  After surgery it may be brought to your room.  For patients admitted to the hospital, discharge time will be determined by your treatment team.  Patients discharged the day of surgery will not be allowed to drive home and will need someone with them for 24 hrs.   Special instructions:  DO not smoke (tobacco or vaping) the morning of your procedure.    Shower the night before and the morning of your surgery with 1/2 bottle of CHG.  Wash your hair first and rinse shampoo off of your hair and body well.     Apply CHG only from the neck down. DO NOT use on open wounds or sores. DO NOT use on your face hair or genitals(private parts). Please wash these areas with your regular soap.  Dry of with a clean towel, wear clean clothes to bed, place clean sheets on your bed before you sleep that night and do not sleep with your pets this night.   Shower again the same way the morning of your procedure. Wear clean, comfortable  clothing and brush your teeth.    Please read over the following fact sheets that you were given. Surgical Site Infection Prevention, Anesthesia Post-op Instructions and Care and Recovery After Surgery       Open Hernia Repair, Adult, Care After What can I expect after the procedure? After the procedure, it is common to have:  Mild discomfort.  Slight bruising.  Mild swelling.  Pain in the belly (abdomen).  A small amount of blood from the cut from surgery (incision). Follow these instructions at home: Your doctor may give you more specific instructions. If you have problems, call your doctor. Medicines  Take over-the-counter and prescription medicines only as told by your doctor.  If told, take steps to prevent problems with pooping (constipation). You may need to: ? Drink enough fluid to keep your pee (urine) pale yellow. ? Take medicines. You will be told what medicines to take. ? Eat foods that are high in fiber. These include beans, whole grains, and fresh fruits and vegetables. ? Limit foods that are high in fat and sugar. These include fried or sweet foods.  Ask your doctor if you should avoid  driving or using machines while you are taking your medicine. Incision care  Follow instructions from your doctor about how to take care of your incision. Make sure you: ? Wash your hands with soap and water for at least 20 seconds before and after you change your bandage (dressing). If you cannot use soap and water, use hand sanitizer. ? Change your bandage. ? Leave stitches or skin glue in place for at least 2 weeks. ? Leave tape strips alone unless you are told to take them off. You may trim the edges of the tape strips if they curl up.  Check your incision every day for signs of infection. Check for: ? More redness, swelling, or pain. ? More fluid or blood. ? Warmth. ? Pus or a bad smell.  Wear loose, soft clothing while your incision heals.   Activity  Rest as  told by your doctor.  Do not lift anything that is heavier than 10 lb (4.5 kg), or the limit that you are told.  Do not play contact sports until your doctor says that this is safe.  If you were given a sedative during your procedure, do not drive or use machines until your doctor says that it is safe. A sedative is a medicine that helps you relax.  Return to your normal activities when your doctor says that it is safe.   General instructions  Do not take baths, swim, or use a hot tub. Ask your doctor about taking showers or sponge baths.  Hold a pillow over your belly when you cough or sneeze. This helps with pain.  Do not smoke or use any products that contain nicotine or tobacco. If you need help quitting, ask your doctor.  Keep all follow-up visits. Contact a doctor if:  You have any of these signs of infection in or around your incision: ? More redness, swelling, or pain. ? More fluid or blood. ? Warmth. ? Pus. ? A bad smell.  You have a fever or chills.  You have blood in your poop (stool).  You have not pooped (had a bowel movement) in 2-3 days.  Medicine does not help your pain. Get help right away if:  You have chest pain, or you are short of breath.  You feel faint or light-headed.  You have very bad pain.  You vomit and your pain is worse.  You have pain, swelling, or redness in a leg. These symptoms may be an emergency. Get help right away. Call your local emergency services (911 in the [place]  Do not wait to see if the symptoms will go away.  Do not drive yourself to the hospital. Summary  After this procedure, it is common to have mild discomfort, slight bruising, and mild swelling.  Follow instructions from your doctor about how to take care of your cut from surgery (incision). Check every day for signs of infection.  Do not lift heavy objects or play contact sports until your doctor says it is safe.  Return to your normal activities as told by  your doctor. This information is not intended to replace advice given to you by your health care provider. Make sure you discuss any questions you have with your health care provider. Document Revised: 07/20/2020 Document Reviewed: 07/20/2020 Elsevier Patient Education  2021 [place] Anesthesia, Adult, Care After This sheet gives you information about how to care for yourself after your procedure. Your health care provider may also give you more specific instructions. If  you have problems or questions, contact your health care provider. What can I expect after the procedure? After the procedure, the following side effects are common:  Pain or discomfort at the IV site.  Nausea.  Vomiting.  Sore throat.  Trouble concentrating.  Feeling cold or chills.  Feeling weak or tired.  Sleepiness and fatigue.  Soreness and body aches. These side effects can affect parts of the body that were not involved in surgery. Follow these instructions at home: For the time period you were told by your health care provider:  Rest.  Do not participate in activities where you could fall or become injured.  Do not drive or use machinery.  Do not drink alcohol.  Do not take sleeping pills or medicines that cause drowsiness.  Do not make important decisions or sign legal documents.  Do not take care of children on your own.   Eating and drinking  Follow any instructions from your health care provider about eating or drinking restrictions.  When you feel hungry, start by eating small amounts of foods that are soft and easy to digest (bland), such as toast. Gradually return to your regular diet.  Drink enough fluid to keep your urine pale yellow.  If you vomit, rehydrate by drinking water, juice, or clear broth. General instructions  If you have sleep apnea, surgery and certain medicines can increase your risk for breathing problems. Follow instructions from your health care  provider about wearing your sleep device: ? Anytime you are sleeping, including during daytime naps. ? While taking prescription pain medicines, sleeping medicines, or medicines that make you drowsy.  Have a responsible adult stay with you for the time you are told. It is important to have someone help care for you until you are awake and alert.  Return to your normal activities as told by your health care provider. Ask your health care provider what activities are safe for you.  Take over-the-counter and prescription medicines only as told by your health care provider.  If you smoke, do not smoke without supervision.  Keep all follow-up visits as told by your health care provider. This is important. Contact a health care provider if:  You have nausea or vomiting that does not get better with medicine.  You cannot eat or drink without vomiting.  You have pain that does not get better with medicine.  You are unable to pass urine.  You develop a skin rash.  You have a fever.  You have redness around your IV site that gets worse. Get help right away if:  You have difficulty breathing.  You have chest pain.  You have blood in your urine or stool, or you vomit blood. Summary  After the procedure, it is common to have a sore throat or nausea. It is also common to feel tired.  Have a responsible adult stay with you for the time you are told. It is important to have someone help care for you until you are awake and alert.  When you feel hungry, start by eating small amounts of foods that are soft and easy to digest (bland), such as toast. Gradually return to your regular diet.  Drink enough fluid to keep your urine pale yellow.  Return to your normal activities as told by your health care provider. Ask your health care provider what activities are safe for you. This information is not intended to replace advice given to you by your health care provider. Make sure you discuss  any  questions you have with your health care provider. Document Revised: 08/20/2020 Document Reviewed: 03/19/2020 Elsevier Patient Education  2021 [employer].

## 2021-01-07 ENCOUNTER — Encounter (HOSPITAL_COMMUNITY)
Admission: RE | Admit: 2021-01-07 | Discharge: 2021-01-07 | Disposition: A | Discharge status: Home / Self Care | Payer: BC Managed Care – PPO | Source: Ambulatory Visit | Attending: General Surgery | Admitting: General Surgery

## 2021-01-07 ENCOUNTER — Other Ambulatory Visit (HOSPITAL_COMMUNITY)
Admission: RE | Admit: 2021-01-07 | Discharge: 2021-01-07 | Disposition: A | Discharge status: Home / Self Care | Payer: BC Managed Care – PPO | Source: Ambulatory Visit | Attending: General Surgery | Admitting: General Surgery

## 2021-01-07 ENCOUNTER — Other Ambulatory Visit: Payer: Self-pay

## 2021-01-07 ENCOUNTER — Encounter (HOSPITAL_COMMUNITY): Payer: Self-pay

## 2021-01-07 ENCOUNTER — Ambulatory Visit: Payer: BC Managed Care – PPO | Admitting: General Surgery

## 2021-01-07 DIAGNOSIS — Z01812 Encounter for preprocedural laboratory examination: Secondary | ICD-10-CM | POA: Insufficient documentation

## 2021-01-07 DIAGNOSIS — Z20822 Contact with and (suspected) exposure to covid-19: Secondary | ICD-10-CM | POA: Diagnosis not present

## 2021-01-07 HISTORY — DX: Pain in unspecified joint: M25.50

## 2021-01-07 LAB — BASIC METABOLIC PANEL
Anion gap: 9 (ref 5–15)
BUN: 14 mg/dL (ref 6–20)
CO2: 27 mmol/L (ref 22–32)
Calcium: 8.9 mg/dL (ref 8.9–10.3)
Chloride: 101 mmol/L (ref 98–111)
Creatinine, Ser: 1.17 mg/dL (ref 0.61–1.24)
GFR, Estimated: >60 mL/min (ref >60)
Glucose, Bld: 115 mg/dL — ABNORMAL HIGH (ref 70–99)
Potassium: 3.8 mmol/L (ref 3.5–5.1)
Sodium: 137 mmol/L (ref 135–145)

## 2021-01-07 LAB — CBC WITH DIFFERENTIAL/PLATELET
Abs Immature Granulocytes: 0.02 10*3/uL (ref 0.00–0.07)
Basophils Absolute: 0.1 10*3/uL (ref 0.0–0.1)
Basophils Relative: 1 %
Eosinophils Absolute: 0.3 10*3/uL (ref 0.0–0.5)
Eosinophils Relative: 4 %
HCT: 45.8 % (ref 39.0–52.0)
Hemoglobin: 15.6 g/dL (ref 13.0–17.0)
Immature Granulocytes: 0 %
Lymphocytes Relative: 21 %
Lymphs Abs: 1.4 10*3/uL (ref 0.7–4.0)
MCH: 29.7 pg (ref 26.0–34.0)
MCHC: 34.1 g/dL (ref 30.0–36.0)
MCV: 87.2 fL (ref 80.0–100.0)
Monocytes Absolute: 0.5 10*3/uL (ref 0.1–1.0)
Monocytes Relative: 7 %
Neutro Abs: 4.6 10*3/uL (ref 1.7–7.7)
Neutrophils Relative %: 67 %
Platelets: 145 10*3/uL — ABNORMAL LOW (ref 150–400)
RBC: 5.25 MIL/uL (ref 4.22–5.81)
RDW: 11.9 % (ref 11.5–15.5)
WBC: 6.8 10*3/uL (ref 4.0–10.5)
nRBC: 0 % (ref 0.0–0.2)

## 2021-01-08 ENCOUNTER — Ambulatory Visit (HOSPITAL_COMMUNITY): Payer: BC Managed Care – PPO

## 2021-01-08 LAB — SARS CORONAVIRUS 2 (TAT 6-24 HRS): SARS Coronavirus 2: NEGATIVE

## 2021-01-11 ENCOUNTER — Ambulatory Visit (HOSPITAL_COMMUNITY)
Admission: RE | Admit: 2021-01-11 | Discharge: 2021-01-11 | Disposition: A | Discharge status: Home / Self Care | Payer: BC Managed Care – PPO | Attending: General Surgery | Admitting: General Surgery

## 2021-01-11 ENCOUNTER — Encounter (HOSPITAL_COMMUNITY)
Admission: RE | Disposition: A | Discharge status: Home / Self Care | Payer: Self-pay | Source: Home / Self Care | Attending: General Surgery

## 2021-01-11 ENCOUNTER — Ambulatory Visit (HOSPITAL_COMMUNITY): Payer: BC Managed Care – PPO | Admitting: Certified Registered"

## 2021-01-11 ENCOUNTER — Encounter (HOSPITAL_COMMUNITY): Payer: Self-pay | Admitting: General Surgery

## 2021-01-11 DIAGNOSIS — Z885 Allergy status to narcotic agent status: Secondary | ICD-10-CM | POA: Insufficient documentation

## 2021-01-11 DIAGNOSIS — G709 Myoneural disorder, unspecified: Secondary | ICD-10-CM | POA: Diagnosis not present

## 2021-01-11 DIAGNOSIS — Z888 Allergy status to other drugs, medicaments and biological substances status: Secondary | ICD-10-CM | POA: Diagnosis not present

## 2021-01-11 DIAGNOSIS — K4090 Unilateral inguinal hernia, without obstruction or gangrene, not specified as recurrent: Secondary | ICD-10-CM | POA: Diagnosis present

## 2021-01-11 DIAGNOSIS — D176 Benign lipomatous neoplasm of spermatic cord: Secondary | ICD-10-CM | POA: Insufficient documentation

## 2021-01-11 HISTORY — PX: INGUINAL HERNIA REPAIR: SHX194

## 2021-01-11 SURGERY — REPAIR, HERNIA, INGUINAL, ADULT
Anesthesia: General | Site: Groin | Laterality: Right

## 2021-01-11 MED ORDER — CHLORHEXIDINE GLUCONATE 0.12 % MT SOLN
15.0000 mL | Freq: Once | OROMUCOSAL | Status: AC
  Administered 2021-01-11: 15 mL via OROMUCOSAL

## 2021-01-11 MED ORDER — FENTANYL CITRATE (PF) 100 MCG/2ML IJ SOLN
INTRAMUSCULAR | Status: DC | PRN
  Administered 2021-01-11 (×5): 50 ug via INTRAVENOUS

## 2021-01-11 MED ORDER — SUGAMMADEX SODIUM 500 MG/5ML IV SOLN
INTRAVENOUS | Status: AC
  Filled 2021-01-11: qty 5

## 2021-01-11 MED ORDER — DEXAMETHASONE SODIUM PHOSPHATE 10 MG/ML IJ SOLN
INTRAMUSCULAR | Status: AC
  Filled 2021-01-11: qty 1

## 2021-01-11 MED ORDER — DIPHENHYDRAMINE HCL 25 MG PO CAPS
25.0000 mg | ORAL_CAPSULE | Freq: Once | ORAL | Status: AC
  Administered 2021-01-11: 25 mg via ORAL

## 2021-01-11 MED ORDER — EPHEDRINE SULFATE-NACL 50-0.9 MG/10ML-% IV SOSY
PREFILLED_SYRINGE | INTRAVENOUS | Status: DC | PRN
  Administered 2021-01-11 (×2): 5 mg via INTRAVENOUS

## 2021-01-11 MED ORDER — BUPIVACAINE LIPOSOME 1.3 % IJ SUSP
INTRAMUSCULAR | Status: AC
  Filled 2021-01-11: qty 20

## 2021-01-11 MED ORDER — SODIUM CHLORIDE 0.9 % IR SOLN
Status: DC | PRN
  Administered 2021-01-11: 1000 mL

## 2021-01-11 MED ORDER — SCOPOLAMINE 1 MG/3DAYS TD PT72
MEDICATED_PATCH | TRANSDERMAL | Status: DC | PRN
  Administered 2021-01-11: 1 via TRANSDERMAL

## 2021-01-11 MED ORDER — DIPHENHYDRAMINE HCL 25 MG PO CAPS
ORAL_CAPSULE | ORAL | Status: AC
  Filled 2021-01-11: qty 1

## 2021-01-11 MED ORDER — KETOROLAC TROMETHAMINE 30 MG/ML IJ SOLN
INTRAMUSCULAR | Status: DC | PRN
  Administered 2021-01-11: 30 mg via INTRAVENOUS

## 2021-01-11 MED ORDER — ORAL CARE MOUTH RINSE: 15.0000 mL | Freq: Once | OROMUCOSAL | Status: AC

## 2021-01-11 MED ORDER — LACTATED RINGERS IV SOLN: INTRAVENOUS | Status: DC | PRN

## 2021-01-11 MED ORDER — KETOROLAC TROMETHAMINE 30 MG/ML IJ SOLN
INTRAMUSCULAR | Status: AC
  Filled 2021-01-11: qty 1

## 2021-01-11 MED ORDER — BUPIVACAINE LIPOSOME 1.3 % IJ SUSP
INTRAMUSCULAR | Status: DC | PRN
  Administered 2021-01-11: 20 mL

## 2021-01-11 MED ORDER — MIDAZOLAM HCL 2 MG/2ML IJ SOLN
INTRAMUSCULAR | Status: AC
  Filled 2021-01-11: qty 2

## 2021-01-11 MED ORDER — PROPOFOL 10 MG/ML IV BOLUS
INTRAVENOUS | Status: DC | PRN
  Administered 2021-01-11: 200 mg via INTRAVENOUS

## 2021-01-11 MED ORDER — ONDANSETRON HCL 4 MG/2ML IJ SOLN
INTRAMUSCULAR | Status: DC | PRN
  Administered 2021-01-11 (×2): 4 mg via INTRAVENOUS

## 2021-01-11 MED ORDER — PROPOFOL 500 MG/50ML IV EMUL
INTRAVENOUS | Status: DC | PRN
  Administered 2021-01-11: 25 ug/kg/min via INTRAVENOUS

## 2021-01-11 MED ORDER — HYDROMORPHONE HCL 1 MG/ML IJ SOLN
0.2500 mg | INTRAMUSCULAR | Status: DC | PRN
Start: 2021-01-11 — End: 2021-01-11
  Administered 2021-01-11 (×2): 0.5 mg via INTRAVENOUS
  Filled 2021-01-11 (×2): qty 0.5

## 2021-01-11 MED ORDER — LIDOCAINE 2% (20 MG/ML) 5 ML SYRINGE
INTRAMUSCULAR | Status: DC | PRN
  Administered 2021-01-11: 80 mg via INTRAVENOUS

## 2021-01-11 MED ORDER — EPHEDRINE 5 MG/ML INJ
INTRAVENOUS | Status: AC
  Filled 2021-01-11: qty 10

## 2021-01-11 MED ORDER — ROCURONIUM BROMIDE 100 MG/10ML IV SOLN
INTRAVENOUS | Status: DC | PRN
  Administered 2021-01-11: 60 mg via INTRAVENOUS

## 2021-01-11 MED ORDER — DEXMEDETOMIDINE (PRECEDEX) IN NS 20 MCG/5ML (4 MCG/ML) IV SYRINGE
PREFILLED_SYRINGE | INTRAVENOUS | Status: AC
  Filled 2021-01-11: qty 5

## 2021-01-11 MED ORDER — FENTANYL CITRATE (PF) 250 MCG/5ML IJ SOLN
INTRAMUSCULAR | Status: AC
  Filled 2021-01-11: qty 5

## 2021-01-11 MED ORDER — CHLORHEXIDINE GLUCONATE CLOTH 2 % EX PADS: 6.0000 | MEDICATED_PAD | Freq: Once | CUTANEOUS | Status: DC

## 2021-01-11 MED ORDER — DEXAMETHASONE SODIUM PHOSPHATE 10 MG/ML IJ SOLN
INTRAMUSCULAR | Status: DC | PRN
  Administered 2021-01-11: 10 mg via INTRAVENOUS

## 2021-01-11 MED ORDER — MIDAZOLAM HCL 5 MG/5ML IJ SOLN
INTRAMUSCULAR | Status: DC | PRN
  Administered 2021-01-11: 2 mg via INTRAVENOUS

## 2021-01-11 MED ORDER — ACETAMINOPHEN 10 MG/ML IV SOLN
INTRAVENOUS | Status: DC | PRN
  Administered 2021-01-11: 1000 mg via INTRAVENOUS

## 2021-01-11 MED ORDER — LIDOCAINE HCL (PF) 2 % IJ SOLN
INTRAMUSCULAR | Status: AC
  Filled 2021-01-11: qty 15

## 2021-01-11 MED ORDER — PROPOFOL 10 MG/ML IV BOLUS
INTRAVENOUS | Status: AC
  Filled 2021-01-11: qty 20

## 2021-01-11 MED ORDER — SCOPOLAMINE 1 MG/3DAYS TD PT72
MEDICATED_PATCH | TRANSDERMAL | Status: AC
  Filled 2021-01-11: qty 1

## 2021-01-11 MED ORDER — ONDANSETRON HCL 4 MG/2ML IJ SOLN
INTRAMUSCULAR | Status: AC
  Filled 2021-01-11: qty 2

## 2021-01-11 MED ORDER — DEXMEDETOMIDINE (PRECEDEX) IN NS 20 MCG/5ML (4 MCG/ML) IV SYRINGE
PREFILLED_SYRINGE | INTRAVENOUS | Status: DC | PRN
  Administered 2021-01-11 (×3): 4 ug via INTRAVENOUS

## 2021-01-11 MED ORDER — ROCURONIUM BROMIDE 10 MG/ML (PF) SYRINGE
PREFILLED_SYRINGE | INTRAVENOUS | Status: AC
  Filled 2021-01-11: qty 10

## 2021-01-11 MED ORDER — HYDROMORPHONE HCL 2 MG PO TABS
2.0000 mg | ORAL_TABLET | Freq: Once | ORAL | Status: AC
  Administered 2021-01-11: 2 mg via ORAL
  Filled 2021-01-11: qty 1

## 2021-01-11 MED ORDER — ONDANSETRON HCL 4 MG/2ML IJ SOLN: 4.0000 mg | Freq: Once | INTRAMUSCULAR | Status: DC | PRN

## 2021-01-11 MED ORDER — SUGAMMADEX SODIUM 200 MG/2ML IV SOLN
INTRAVENOUS | Status: DC | PRN
  Administered 2021-01-11: 475 mg via INTRAVENOUS

## 2021-01-11 MED ORDER — LACTATED RINGERS IV SOLN: INTRAVENOUS | Status: DC

## 2021-01-11 MED ORDER — VANCOMYCIN HCL 1500 MG/300ML IV SOLN
1500.0000 mg | INTRAVENOUS | Status: AC
  Administered 2021-01-11: 1500 mg via INTRAVENOUS
  Filled 2021-01-11: qty 300

## 2021-01-11 MED ORDER — ACETAMINOPHEN 10 MG/ML IV SOLN
INTRAVENOUS | Status: AC
  Filled 2021-01-11: qty 100

## 2021-01-11 MED ORDER — HYDROCODONE-ACETAMINOPHEN 10-325 MG PO TABS: 1.0000 | ORAL_TABLET | Freq: Four times a day (QID) | ORAL | 0 refills | Status: DC | PRN

## 2021-01-11 MED ORDER — SUCCINYLCHOLINE CHLORIDE 200 MG/10ML IV SOSY
PREFILLED_SYRINGE | INTRAVENOUS | Status: DC | PRN
  Administered 2021-01-11: 140 mg via INTRAVENOUS

## 2021-01-11 SURGICAL SUPPLY — 37 items
ADH SKN CLS APL DERMABOND .7 (GAUZE/BANDAGES/DRESSINGS) ×1
CLOTH BEACON ORANGE TIMEOUT ST (SAFETY) ×2 IMPLANT
COVER LIGHT HANDLE STERIS (MISCELLANEOUS) ×4 IMPLANT
COVER WAND RF STERILE (DRAPES) ×2 IMPLANT
DERMABOND ADVANCED (GAUZE/BANDAGES/DRESSINGS) ×1
DERMABOND ADVANCED .7 DNX12 (GAUZE/BANDAGES/DRESSINGS) ×1 IMPLANT
DRAIN PENROSE 0.5X18 (DRAIN) ×2 IMPLANT
ELECT REM PT RETURN 9FT ADLT (ELECTROSURGICAL) ×2
ELECTRODE REM PT RTRN 9FT ADLT (ELECTROSURGICAL) ×1 IMPLANT
GAUZE SPONGE 4X4 12PLY STRL (GAUZE/BANDAGES/DRESSINGS) ×2 IMPLANT
GLOVE BIOGEL PI IND STRL 7.0 (GLOVE) ×3 IMPLANT
GLOVE BIOGEL PI INDICATOR 7.0 (GLOVE) ×3
GLOVE SURG SS PI 7.5 STRL IVOR (GLOVE) ×2 IMPLANT
GOWN STRL REUS W/TWL LRG LVL3 (GOWN DISPOSABLE) ×6 IMPLANT
INST SET MINOR GENERAL (KITS) ×2 IMPLANT
KIT TURNOVER KIT A (KITS) ×2 IMPLANT
MANIFOLD NEPTUNE II (INSTRUMENTS) ×2 IMPLANT
MESH MARLEX PLUG MEDIUM (Mesh General) ×2 IMPLANT
NEEDLE HYPO 18GX1.5 BLUNT FILL (NEEDLE) ×2 IMPLANT
NEEDLE HYPO 21X1.5 SAFETY (NEEDLE) ×2 IMPLANT
NS IRRIG 1000ML POUR BTL (IV SOLUTION) ×2 IMPLANT
PACK MINOR (CUSTOM PROCEDURE TRAY) ×2 IMPLANT
PAD ARMBOARD 7.5X6 YLW CONV (MISCELLANEOUS) ×2 IMPLANT
PENCIL SMOKE EVACUATOR (MISCELLANEOUS) ×2 IMPLANT
SET BASIN LINEN APH (SET/KITS/TRAYS/PACK) ×2 IMPLANT
SOL PREP PROV IODINE SCRUB 4OZ (MISCELLANEOUS) ×2 IMPLANT
SUT MNCRL AB 4-0 PS2 18 (SUTURE) ×2 IMPLANT
SUT NOVA NAB GS-22 2 2-0 T-19 (SUTURE) ×4 IMPLANT
SUT SILK 3 0 (SUTURE)
SUT SILK 3-0 18XBRD TIE 12 (SUTURE) IMPLANT
SUT VIC AB 2-0 CT1 27 (SUTURE) ×2
SUT VIC AB 2-0 CT1 TAPERPNT 27 (SUTURE) ×1 IMPLANT
SUT VIC AB 3-0 SH 27 (SUTURE) ×2
SUT VIC AB 3-0 SH 27X BRD (SUTURE) ×1 IMPLANT
SUT VICRYL AB 2 0 TIES (SUTURE) IMPLANT
SUT VICRYL AB 3 0 TIES (SUTURE) ×2 IMPLANT
SYR 20ML LL LF (SYRINGE) ×4 IMPLANT

## 2021-01-11 NOTE — Op Note (Signed)
Patient:  Chase Wyatt  DOB:  May 06, 1971  MRN:  [id number]   Preop Diagnosis: Right inguinal hernia  Postop Diagnosis: Same  Procedure: Right inguinal herniorrhaphy with mesh  Surgeon: Aviva Signs, MD  Anes: General endotracheal  Indications: Patient is a 50 year old white male who presents with a symptomatic right inguinal hernia.  The risks and benefits of the procedure including bleeding, infection, mesh use, and the possibly recurrence of the hernia were fully explained to the patient, who gave informed consent.  Procedure note: The patient was placed in the supine position.  After induction of general endotracheal anesthesia, the right groin region was prepped and draped using the usual sterile technique with Betadine.  Surgical site confirmation was performed.  Incision was made in the right groin region down to the external oblique aponeurosis.  The aponeurosis was incised to the external ring.  Penrose drain was placed around the spermatic cord.  The vas deferens was noted within the spermatic cord.  The ilioinguinal nerve was identified and retracted superiorly from the operative field.  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  This was freed away from the spermatic cord and a high ligation was performed using a 3-0 Vicryl tie.  It was then excised and disposed of.  The patient did have an indirect hernia of fat tissue which was freed away from the spermatic cord up to the peritoneal reflection and inverted.  A medium size Bard PerFix plug was then inserted.  An onlay patch was placed along the floor of the inguinal canal and secured superiorly to the conjoined tendon and inferiorly to the shelving edge of Poupart's ligament using 2-0 Novafil interrupted sutures.  The internal ring was recreated using a 2-0 Novafil interrupted suture.  The external oblique aponeurosis was reapproximated using a 2-0 Vicryl running suture.  The subcutaneous layer was reapproximated using a  3-0 Vicryl interrupted suture.  Exparel was instilled into the surrounding wound.  The skin was closed using a 4-0 Monocryl subcuticular suture.  Dermabond was applied.  All tape and needle counts were correct at the end of the procedure.  The patient was extubated in the operating room and transferred to PACU in stable condition.  Complications: None  EBL: Minimal  Specimen: None

## 2021-01-11 NOTE — Anesthesia Procedure Notes (Signed)
Procedure Name: Intubation Date/Time: 01/11/2021 11:51 AM Performed by: Gwyndolyn Saxon, CRNA Pre-anesthesia Checklist: Patient identified, Emergency Drugs available, Suction available and Patient being monitored Patient Re-evaluated:Patient Re-evaluated prior to induction Oxygen Delivery Method: Circle system utilized Preoxygenation: Pre-oxygenation with 100% oxygen Induction Type: IV induction Ventilation: Mask ventilation with difficulty and Oral airway inserted - appropriate to patient size Laryngoscope Size: Sabra Heck and 3 Grade View: Grade I Tube type: Oral Tube size: 7.5 mm Number of attempts: 1 Airway Equipment and Method: Patient positioned with wedge pillow and Stylet Placement Confirmation: ETT inserted through vocal cords under direct vision,  positive ETCO2 and breath sounds checked- equal and bilateral Secured at: 24 cm Tube secured with: Tape Dental Injury: Teeth and Oropharynx as per pre-operative assessment

## 2021-01-11 NOTE — Transfer of Care (Signed)
Immediate Anesthesia Transfer of Care Note  Patient: Chase Wyatt  Procedure(s) Performed: HERNIA REPAIR INGUINAL ADULT (Right Groin)  Patient Location: PACU  Anesthesia Type:General  Level of Consciousness: drowsy and patient cooperative  Airway & Oxygen Therapy: Patient Spontanous Breathing and Patient connected to nasal cannula oxygen  Post-op Assessment: Report given to RN and Post -op Vital signs reviewed and stable  Post vital signs: Reviewed and stable  Last Vitals:  Vitals Value Taken Time  BP 148/77 01/11/21 1248  Temp    Pulse 85 01/11/21 1252  Resp 22 01/11/21 1252  SpO2 100 % 01/11/21 1252  Vitals shown include unvalidated device data.  Last Pain:  Vitals:   01/11/21 1030  PainSc: 6          Complications: No complications documented.

## 2021-01-11 NOTE — Anesthesia Postprocedure Evaluation (Signed)
Anesthesia Post Note  Patient: Chase Wyatt  Procedure(s) Performed: HERNIA REPAIR INGUINAL ADULT (Right Groin)  Patient location during evaluation: PACU Anesthesia Type: General Level of consciousness: awake Pain management: pain level controlled Vital Signs Assessment: post-procedure vital signs reviewed and stable Respiratory status: spontaneous breathing Cardiovascular status: blood pressure returned to baseline Postop Assessment: no headache Anesthetic complications: no   No complications documented.   Last Vitals:  Vitals:   01/11/21 1345 01/11/21 1400  BP: (!) 163/86 (!) 157/71  Pulse: 63 (!) 57  Resp: 10 17  Temp:    SpO2: 92% 96%    Last Pain:  Vitals:   01/11/21 1330  PainSc: [place]

## 2021-01-11 NOTE — Discharge Instructions (Signed)
Open Hernia Repair, Adult, Care After The following information offers guidance on how to care for yourself after your procedure. Your health care provider may also give you more specific instructions. If you have problems or questions, contact your health care provider. What can I expect after the procedure? After the procedure, it is common to have:  Mild discomfort.  Slight bruising.  Minor swelling.  Pain in the abdomen.  A small amount of blood from the incision. Follow these instructions at home: Medicines  Take over-the-counter and prescription medicines only as told by your health care provider.  Ask your health care provider if the medicine prescribed to you: ? Requires you to avoid driving or using machinery. ? Can cause constipation. You may need to take these actions to prevent or treat constipation:  Drink enough fluid to keep your urine pale yellow.  Take over-the-counter or prescription medicines.  Eat foods that are high in fiber, such as beans, whole grains, and fresh fruits and vegetables.  Limit foods that are high in fat and processed sugars, such as fried or sweet foods.  If you were prescribed an antibiotic medicine, take it as told by your health care provider. Do not stop using the antibiotic even if you start to feel better. Incision care  Follow instructions from your health care provider about how to take care of your incision. Make sure you: ? Wash your hands with soap and water for at least 20 seconds before and after you change your bandage (dressing). If soap and water are not available, use hand sanitizer. ? Change your dressing as told by your health care provider. ? Leave stitches (sutures), skin glue, or adhesive strips in place. These skin closures may need to stay in place for 2 weeks or longer. If adhesive strip edges start to loosen and curl up, you may trim the loose edges. Do not remove adhesive strips completely unless your health care  provider tells you to do that.  Check your incision area every day for signs of infection. Check for: ? More redness, swelling, or pain. ? More fluid or blood. ? Warmth. ? Pus or a bad smell.  Wear loose, soft clothing while your incision heals.   Activity  Rest as told by your health care provider.  Do not lift anything that is heavier than 10 lb (4.5 kg), or the limit that you are told, until your health care provider says that it is safe.  Do not play contact sports until your health care provider says that this is safe.  If you were given a sedative during the procedure, it can affect you for several hours. Do not drive or operate machinery until your health care provider says that it is safe.  Return to your normal activities as told by your health care provider. Ask your health care provider what activities are safe for you.   General instructions  Do not take baths, swim, or use a hot tub until your health care provider approves. Ask your health care provider if you may take showers. You may only be allowed to take sponge baths.  Hold a pillow over your abdomen when you cough or sneeze. This helps with pain.  Do not use any products that contain nicotine or tobacco. These products include cigarettes, chewing tobacco, and vaping devices, such as e-cigarettes. If you need help quitting, ask your health care provider.  Keep all follow-up visits. This is important. Contact a health care provider if:  You  have any of these signs of infection: ? More redness, swelling, or pain around your incision. ? More fluid or blood coming from your incision. ? Warmth coming from your incision. ? Pus or a bad smell coming from your incision. ? A fever or chills.  You have blood in your stool (feces).  You have not had a bowel movement in 2-3 days.  Your pain is not controlled with medicine. Get help right away if:  You have chest pain or shortness of breath.  You feel faint or  light-headed.  You have severe pain.  You vomit and your pain is worse.  You have pain, swelling, or redness in a leg. These symptoms may represent a serious problem that is an emergency. Do not wait to see if the symptoms will go away. Get medical help right away. Call your local emergency services (911 in the [place] Do not drive yourself to the hospital. Summary  After an open hernia repair, it is common to have mild discomfort, slight bruising, and minor swelling.  Follow instructions from your health care provider about how to take care of your incision. Check every day for signs of infection.  Do not lift heavy objects or play contact sports until your health care provider says it is safe.  Return to your normal activities as told by your health care provider. Ask your health care provider what activities are safe for you. This information is not intended to replace advice given to you by your health care provider. Make sure you discuss any questions you have with your health care provider. Document Revised: 07/20/2020 Document Reviewed: 07/20/2020 Elsevier Patient Education  2021 [place] Anesthesia, Adult, Care After This sheet gives you information about how to care for yourself after your procedure. Your health care provider may also give you more specific instructions. If you have problems or questions, contact your health care provider. What can I expect after the procedure? After the procedure, the following side effects are common:  Pain or discomfort at the IV site.  Nausea.  Vomiting.  Sore throat.  Trouble concentrating.  Feeling cold or chills.  Feeling weak or tired.  Sleepiness and fatigue.  Soreness and body aches. These side effects can affect parts of the body that were not involved in surgery. Follow these instructions at home: For the time period you were told by your health care provider:  Rest.  Do not participate in  activities where you could fall or become injured.  Do not drive or use machinery.  Do not drink alcohol.  Do not take sleeping pills or medicines that cause drowsiness.  Do not make important decisions or sign legal documents.  Do not take care of children on your own.   Eating and drinking  Follow any instructions from your health care provider about eating or drinking restrictions.  When you feel hungry, start by eating small amounts of foods that are soft and easy to digest (bland), such as toast. Gradually return to your regular diet.  Drink enough fluid to keep your urine pale yellow.  If you vomit, rehydrate by drinking water, juice, or clear broth. General instructions  If you have sleep apnea, surgery and certain medicines can increase your risk for breathing problems. Follow instructions from your health care provider about wearing your sleep device: ? Anytime you are sleeping, including during daytime naps. ? While taking prescription pain medicines, sleeping medicines, or medicines that make you  drowsy.  Have a responsible adult stay with you for the time you are told. It is important to have someone help care for you until you are awake and alert.  Return to your normal activities as told by your health care provider. Ask your health care provider what activities are safe for you.  Take over-the-counter and prescription medicines only as told by your health care provider.  If you smoke, do not smoke without supervision.  Keep all follow-up visits as told by your health care provider. This is important. Contact a health care provider if:  You have nausea or vomiting that does not get better with medicine.  You cannot eat or drink without vomiting.  You have pain that does not get better with medicine.  You are unable to pass urine.  You develop a skin rash.  You have a fever.  You have redness around your IV site that gets worse. Get help right away  if:  You have difficulty breathing.  You have chest pain.  You have blood in your urine or stool, or you vomit blood. Summary  After the procedure, it is common to have a sore throat or nausea. It is also common to feel tired.  Have a responsible adult stay with you for the time you are told. It is important to have someone help care for you until you are awake and alert.  When you feel hungry, start by eating small amounts of foods that are soft and easy to digest (bland), such as toast. Gradually return to your regular diet.  Drink enough fluid to keep your urine pale yellow.  Return to your normal activities as told by your health care provider. Ask your health care provider what activities are safe for you. This information is not intended to replace advice given to you by your health care provider. Make sure you discuss any questions you have with your health care provider. Document Revised: 08/20/2020 Document Reviewed: 03/19/2020 Elsevier Patient Education  2021 [place] ON Friday January 28,2022.  DO NOT USE ANY ADDITIONAL NUMBING MEDICATIONS WITHOUT CONSULTING A PHYSICIAN UNTIL AFTER Friday   Bupivacaine Liposomal Suspension for Injection What is this medicine? BUPIVACAINE LIPOSOMAL (bue PIV a kane LIP oh som al) is an anesthetic. It causes loss of feeling in the skin or other tissues. It is used to prevent and to treat pain from some procedures. This medicine may be used for other purposes; ask your health care provider or pharmacist if you have questions. COMMON BRAND NAME(S): EXPAREL What should I tell my health care provider before I take this medicine? They need to know if you have any of these conditions:  G6PD deficiency  heart disease  kidney disease  liver disease  low blood pressure  lung or breathing disease, like asthma  an unusual or allergic reaction to bupivacaine, other medicines, foods, dyes, or  preservatives  pregnant or trying to get pregnant  breast-feeding How should I use this medicine? This medicine is injected into the affected area. It is given by a health care provider in a hospital or clinic setting. Talk to your health care provider about the use of this medicine in children. While it may be given to children as young as 6 years for selected conditions, precautions do apply. Overdosage: If you think you have taken too much of this medicine contact a poison control center or emergency room at once. NOTE: This medicine  is only for you. Do not share this medicine with others. What if I miss a dose? This does not apply. What may interact with this medicine? This medicine may interact with the following medications:  acetaminophen  certain antibiotics like dapsone, nitrofurantoin, aminosalicylic acid, sulfonamides  certain medicines for seizures like phenobarbital, phenytoin, valproic acid  chloroquine  cyclophosphamide  flutamide  hydroxyurea  ifosfamide  metoclopramide  nitric oxide  nitroglycerin  nitroprusside  nitrous oxide  other local anesthetics like lidocaine, pramoxine, tetracaine  primaquine  quinine  rasburicase  sulfasalazine This list may not describe all possible interactions. Give your health care provider a list of all the medicines, herbs, non-prescription drugs, or dietary supplements you use. Also tell them if you smoke, drink alcohol, or use illegal drugs. Some items may interact with your medicine. What should I watch for while using this medicine? Your condition will be monitored carefully while you are receiving this medicine. Be careful to avoid injury while the area is numb, and you are not aware of pain. What side effects may I notice from receiving this medicine? Side effects that you should report to your doctor or health care professional as soon as possible:  allergic reactions like skin rash, itching or hives,  swelling of the face, lips, or tongue  seizures  signs and symptoms of a dangerous change in heartbeat or heart rhythm like chest pain; dizziness; fast, irregular heartbeat; palpitations; feeling faint or lightheaded; falls; breathing problems  signs and symptoms of methemoglobinemia such as pale, gray, or blue colored skin; headache; fast heartbeat; shortness of breath; feeling faint or lightheaded, falls; tiredness Side effects that usually do not require medical attention (report to your doctor or health care professional if they continue or are bothersome):  anxious  back pain  changes in taste  changes in vision  constipation  dizziness  fever  nausea, vomiting This list may not describe all possible side effects. Call your doctor for medical advice about side effects. You may report side effects to FDA at 1-800-FDA-1088. Where should I keep my medicine? This drug is given in a hospital or clinic and will not be stored at home. NOTE: This sheet is a summary. It may not cover all possible information. If you have questions about this medicine, talk to your doctor, pharmacist, or health care provider.  2021 Elsevier/Gold Standard (2020-03-12 12:24:57)     PLEASE REMOVE THE SCOPOLAMINE PATCH BEHIND YOUR LEFT EAR ON Thursday January 14, 2021. [place] HANDS AFTER REMOVAL   Scopolamine skin patches What is this medicine? SCOPOLAMINE (skoe POL a meen) is used to prevent nausea and vomiting caused by motion sickness, anesthesia and surgery. This medicine may be used for other purposes; ask your health care provider or pharmacist if you have questions. COMMON BRAND NAME(S): Transderm Scop What should I tell my health care provider before I take this medicine? They need to know if you have any of these conditions:  are scheduled to have a gastric secretion test  glaucoma  heart disease  kidney disease  liver disease  lung or breathing disease, like asthma  mental  illness  prostate disease  seizures  stomach or intestine problems  trouble passing urine  an unusual or allergic reaction to scopolamine, atropine, other medicines, foods, dyes, or preservatives  pregnant or trying to get pregnant  breast-feeding How should I use this medicine? This medicine is for external use only. Follow the directions on the prescription label. Wear only 1 patch at a  time. Choose an area behind the ear, that is clean, dry, hairless and free from any cuts or irritation. Wipe the area with a clean dry tissue. Peel off the plastic backing of the skin patch, trying not to touch the adhesive side with your hands. Do not cut the patches. Firmly apply to the area you have chosen, with the metallic side of the patch to the skin and the tan-colored side showing. Once firmly in place, wash your hands well with soap and water. Do not get this medicine into your eyes. After removing the patch, wash your hands and the area behind your ear thoroughly with soap and water. The patch will still contain some medicine after use. To avoid accidental contact or ingestion by children or pets, fold the used patch in half with the sticky side together and throw away in the trash out of the reach of children and pets. If you need to use a second patch after you remove the first, place it behind the other ear. A special MedGuide will be given to you by the pharmacist with each prescription and refill. Be sure to read this information carefully each time. Talk to your pediatrician regarding the use of this medicine in children. Special care may be needed. Overdosage: If you think you have taken too much of this medicine contact a poison control center or emergency room at once. NOTE: This medicine is only for you. Do not share this medicine with others. What if I miss a dose? This does not apply. This medicine is not for regular use. What may interact with this  medicine?  alcohol  antihistamines for allergy cough and cold  atropine  certain medicines for anxiety or sleep  certain medicines for bladder problems like oxybutynin, tolterodine  certain medicines for depression like amitriptyline, fluoxetine, sertraline  certain medicines for stomach problems like dicyclomine, hyoscyamine  certain medicines for Parkinson's disease like benztropine, trihexyphenidyl  certain medicines for seizures like phenobarbital, primidone  general anesthetics like halothane, isoflurane, methoxyflurane, propofol  ipratropium  local anesthetics like lidocaine, pramoxine, tetracaine  medicines that relax muscles for surgery  phenothiazines like chlorpromazine, mesoridazine, prochlorperazine, thioridazine  narcotic medicines for pain  other belladonna alkaloids This list may not describe all possible interactions. Give your health care provider a list of all the medicines, herbs, non-prescription drugs, or dietary supplements you use. Also tell them if you smoke, drink alcohol, or use illegal drugs. Some items may interact with your medicine. What should I watch for while using this medicine? Limit contact with water while swimming and bathing because the patch may fall off. If the patch falls off, throw it away and put a new one behind the other ear. You may get drowsy or dizzy. Do not drive, use machinery, or do anything that needs mental alertness until you know how this medicine affects you. Do not stand or sit up quickly, especially if you are an older patient. This reduces the risk of dizzy or fainting spells. Alcohol may interfere with the effect of this medicine. Avoid alcoholic drinks. Your mouth may get dry. Chewing sugarless gum or sucking hard candy, and drinking plenty of water may help. Contact your healthcare professional if the problem does not go away or is severe. This medicine may cause dry eyes and blurred vision. If you wear contact  lenses, you may feel some discomfort. Lubricating drops may help. See your healthcare professional if the problem does not go away or is severe. If you are  going to need surgery, an MRI, CT scan, or other procedure, tell your healthcare professional that you are using this medicine. You may need to remove the patch before the procedure. What side effects may I notice from receiving this medicine? Side effects that you should report to your doctor or health care professional as soon as possible:  allergic reactions like skin rash, itching or hives; swelling of the face, lips, or tongue  blurred vision  changes in vision  confusion  dizziness  eye pain  fast, irregular heartbeat  hallucinations, loss of contact with reality  nausea, vomiting  pain or trouble passing urine  restlessness  seizures  skin irritation  stomach pain Side effects that usually do not require medical attention (report to your doctor or health care professional if they continue or are bothersome):  drowsiness  dry mouth  headache  sore throat This list may not describe all possible side effects. Call your doctor for medical advice about side effects. You may report side effects to FDA at 1-800-FDA-1088. Where should I keep my medicine? Keep out of the reach of children. Store at room temperature between 20 and 25 degrees C (68 and 77 degrees F). Keep this medicine in the foil package until ready to use. Throw away any unused medicine after the expiration date. NOTE: This sheet is a summary. It may not cover all possible information. If you have questions about this medicine, talk to your doctor, pharmacist, or health care provider.  2021 Elsevier/Gold Standard (2018-02-23 16:14:46)      Acetaminophen; Hydrocodone tablets or capsules What is this medicine? ACETAMINOPHEN; HYDROCODONE (a set a MEE noe fen; hye droe KOE done) is a pain reliever. It is used to treat moderate to severe pain. This  medicine may be used for other purposes; ask your health care provider or pharmacist if you have questions. COMMON BRAND NAME(S): Anexsia, Bancap HC, Ceta-Plus, Co-Gesic, Comfortpak, Dolagesic, [employer], DuoCet, Hydrocet, Hydrogesic, [place], Lorcet HD, Lorcet Plus, Lortab, Margesic H, Maxidone, [place], Polygesic, [place], [place], [place], Vicodin, Vicodin ES, Vicodin HP, Charlane Ferretti What should I tell my health care provider before I take this medicine? They need to know if you have any of these conditions:  brain tumor  drug abuse or addiction  head injury  heart disease  if you often drink alcohol  kidney disease  liver disease  low adrenal gland function  lung disease, asthma, or breathing problems  seizures  stomach or intestine problems  taken an MAOI like Marplan, Nardil, or Parnate in the last 14 days  an unusual or allergic reaction to acetaminophen, hydrocodone, other medicines, foods, dyes, or preservatives  pregnant or trying to get pregnant  breast-feeding How should I use this medicine? Take this medicine by mouth with a glass of water. Follow the directions on the prescription label. You can take it with or without food. If it upsets your stomach, take it with food. Do not take your medicine more often than directed. A special MedGuide will be given to you by the pharmacist with each prescription and refill. Be sure to read this information carefully each time. Talk to your pediatrician regarding the use of this medicine in children. Special care may be needed. Overdosage: If you think you have taken too much of this medicine contact a poison control center or emergency room at once. NOTE: This medicine is only for you. Do not share this medicine with others. What if I miss a dose? If you miss a  dose, take it as soon as you can. If it is almost time for your next dose, take only that dose. Do not take double or extra doses. What may interact with this  medicine? This medicine may interact with the following medications:  alcohol  antiviral medicines for HIV or AIDS  atropine  antihistamines for allergy, cough and cold  certain antibiotics like erythromycin, clarithromycin  certain medicines for anxiety or sleep  certain medicines for bladder problems like oxybutynin, tolterodine  certain medicines for depression like amitriptyline, fluoxetine, sertraline  certain medicines for fungal infections like ketoconazole and itraconazole  certain medicines for Parkinson's disease like benztropine, trihexyphenidyl  certain medicines for seizures like carbamazepine, phenobarbital, phenytoin, primidone  certain medicines for stomach problems like dicyclomine, hyoscyamine  certain medicines for travel sickness like scopolamine  general anesthetics like halothane, isoflurane, methoxyflurane, propofol  ipratropium  local anesthetics like lidocaine, pramoxine, tetracaine  MAOIs like Carbex, Eldepryl, Marplan, Nardil, and Parnate  medicines that relax muscles for surgery  other medicines with acetaminophen  other narcotic medicines for pain or cough  phenothiazines like chlorpromazine, mesoridazine, prochlorperazine, thioridazine  rifampin This list may not describe all possible interactions. Give your health care provider a list of all the medicines, herbs, non-prescription drugs, or dietary supplements you use. Also tell them if you smoke, drink alcohol, or use illegal drugs. Some items may interact with your medicine. What should I watch for while using this medicine? Tell your health care provider if your pain does not go away, if it gets worse, or if you have new or a different type of pain. You may develop tolerance to this drug. Tolerance means that you will need a higher dose of the drug for pain relief. Tolerance is normal and is expected if you take this drug for a long time. There are different types of narcotic drugs  (opioids) for pain. If you take more than one type at the same time, you may have more side effects. Give your health care provider a list of all drugs you use. He or she will tell you how much drug to take. Do not take more drug than directed. Get emergency help right away if you have problems breathing. Do not suddenly stop taking your drug because you may develop a severe reaction. Your body becomes used to the drug. This does NOT mean you are addicted. Addiction is a behavior related to getting and using a drug for a nonmedical reason. If you have pain, you have a medical reason to take pain drug. Your health care provider will tell you how much drug to take. If your health care provider wants you to stop the drug, the dose will be slowly lowered over time to avoid any side effects. Talk to your health care provider about naloxone and how to get it. Naloxone is an emergency drug used for an opioid overdose. An overdose can happen if you take too much opioid. It can also happen if an opioid is taken with some other drugs or substances, like alcohol. Know the symptoms of an overdose, like trouble breathing, unusually tired or sleepy, or not being able to respond or wake up. Make sure to tell caregivers and close contacts where it is stored. Make sure they know how to use it. After naloxone is given, you must get emergency help right away. Naloxone is a temporary treatment. Repeat doses may be needed. Do not take other drugs that contain acetaminophen with this drug. Many non-prescription drugs contain  acetaminophen. Always read labels carefully. If you have questions, ask your health care provider. If you take too much acetaminophen, get medical help right away. Too much acetaminophen can be very dangerous and cause liver damage. Even if you do not have symptoms, it is important to get help right away. You may get drowsy or dizzy. Do not drive, use machinery, or do anything that needs mental alertness until  you know how this drug affects you. Do not stand up or sit up quickly, especially if you are an older patient. This reduces the risk of dizzy or fainting spells. Alcohol may interfere with the effect of this drug. Avoid alcoholic drinks. This drug will cause constipation. If you do not have a bowel movement for 3 days, call your health care provider. Your mouth may get dry. Chewing sugarless gum or sucking hard candy and drinking plenty of water may help. Contact your health care provider if the problem does not go away or is severe. What side effects may I notice from receiving this medicine? Side effects that you should report to your doctor or health care professional as soon as possible:  allergic reactions like skin rash, itching or hives, swelling of the face, lips, or tongue  breathing problems  confusion  redness, blistering, peeling or loosening of the skin, including inside the mouth  signs and symptoms of low blood pressure like dizziness; feeling faint or lightheaded, falls; unusually weak or tired  trouble passing urine or change in the amount of urine  yellowing of the eyes or skin Side effects that usually do not require medical attention (report to your doctor or health care professional if they continue or are bothersome):  constipation  dry mouth  nausea, vomiting  tiredness This list may not describe all possible side effects. Call your doctor for medical advice about side effects. You may report side effects to FDA at 1-800-FDA-1088. Where should I keep my medicine? Keep out of the reach of children. This medicine can be abused. Keep your medicine in a safe place to protect it from theft. Do not share this medicine with anyone. Selling or giving away this medicine is dangerous and against the law. Store at room temperature between 15 and 30 degrees C (59 and 86 degrees F). This medicine may cause harm and death if it is taken by other adults, children, or pets.  Return medicine that has not been used to an official disposal site. Contact the DEA at 6143432457 or your city/county government to find a site. If you cannot return the medicine, flush it down the toilet. Do not use the medicine after the expiration date. NOTE: This sheet is a summary. It may not cover all possible information. If you have questions about this medicine, talk to your doctor, pharmacist, or health care provider.  2021 Elsevier/Gold Standard (2019-07-17 12:25:54)

## 2021-01-11 NOTE — Interval H&P Note (Signed)
History and Physical Interval Note:  01/11/2021 10:21 AM  Chase Wyatt  has presented today for surgery, with the diagnosis of Right inguinal hernia.  The various methods of treatment have been discussed with the patient and family. After consideration of risks, benefits and other options for treatment, the patient has consented to  Procedure(s): HERNIA REPAIR INGUINAL ADULT (Right) as a surgical intervention.  The patient's history has been reviewed, patient examined, no change in status, stable for surgery.  I have reviewed the patient's chart and labs.  Questions were answered to the patient's satisfaction.     Aviva Signs

## 2021-01-11 NOTE — Anesthesia Preprocedure Evaluation (Signed)
Anesthesia Evaluation  Patient identified by MRN, date of birth, ID band Patient awake    Reviewed: Allergy & Precautions, H&P , NPO status , Patient's Chart, lab work & pertinent test results, reviewed documented beta blocker date and time   History of Anesthesia Complications (+) PONV and history of anesthetic complications  Airway Mallampati: II  TM Distance: >3 FB Neck ROM: full    Dental no notable dental hx. (+) Teeth Intact   Pulmonary sleep apnea ,    Pulmonary exam normal breath sounds clear to auscultation       Cardiovascular Exercise Tolerance: Good hypertension, + Valvular Problems/Murmurs  Rhythm:regular Rate:Normal     Neuro/Psych Anxiety  Neuromuscular disease negative psych ROS   GI/Hepatic Neg liver ROS, GERD  Medicated,  Endo/Other  negative endocrine ROS  Renal/GU negative Renal ROS  negative genitourinary   Musculoskeletal   Abdominal   Peds  Hematology negative hematology ROS (+)   Anesthesia Other Findings   Reproductive/Obstetrics negative OB ROS                             Anesthesia Physical Anesthesia Plan  ASA: II  Anesthesia Plan: General   Post-op Pain Management:    Induction:   PONV Risk Score and Plan: Propofol infusion  Airway Management Planned:   Additional Equipment:   Intra-op Plan:   Post-operative Plan:   Informed Consent: I have reviewed the patients History and Physical, chart, labs and discussed the procedure including the risks, benefits and alternatives for the proposed anesthesia with the patient or authorized representative who has indicated his/her understanding and acceptance.     Dental Advisory Given  Plan Discussed with: CRNA  Anesthesia Plan Comments:         Anesthesia Quick Evaluation

## 2021-01-12 ENCOUNTER — Encounter (HOSPITAL_COMMUNITY): Payer: Self-pay | Admitting: General Surgery

## 2021-01-22 ENCOUNTER — Encounter: Payer: Self-pay | Admitting: General Surgery

## 2021-01-22 ENCOUNTER — Telehealth (INDEPENDENT_AMBULATORY_CARE_PROVIDER_SITE_OTHER): Payer: BC Managed Care – PPO | Admitting: General Surgery

## 2021-01-22 DIAGNOSIS — Z09 Encounter for follow-up examination after completed treatment for conditions other than malignant neoplasm: Secondary | ICD-10-CM

## 2021-01-22 NOTE — Progress Notes (Signed)
Subjective:     Chase Wyatt  Virtual telephone postoperative visit performed with the patient.  He was on his cell phone and I was in the office.  He states he is doing well.  He did have some normal postoperative pain which has since significantly eased.  He may try to start working next week. Objective:    There were no vitals taken for this visit.  General:  alert, cooperative and no distress       Assessment:    Doing well postoperatively.    Plan:   Patient may start increasing his activity and increasing the amount of weight he can lift.  He was told that he may need to refrain from [employer] for the next few weeks.  He was told to call me should any problems arise or he needs more time off of work.  He understands.  Total telephone time was 6 minutes.  As this was a postoperative visit, this is not a billable visit as this was part of the global fee.

## 2021-04-01 ENCOUNTER — Other Ambulatory Visit: Payer: Self-pay

## 2021-04-01 ENCOUNTER — Ambulatory Visit (INDEPENDENT_AMBULATORY_CARE_PROVIDER_SITE_OTHER): Payer: BC Managed Care – PPO | Admitting: General Surgery

## 2021-04-01 ENCOUNTER — Encounter: Payer: Self-pay | Admitting: General Surgery

## 2021-04-01 VITALS — BP 149/89 | HR 59 | Temp 98.1°F | Resp 12 | Ht 76.0 in | Wt 282.0 lb

## 2021-04-01 DIAGNOSIS — Z09 Encounter for follow-up examination after completed treatment for conditions other than malignant neoplasm: Secondary | ICD-10-CM

## 2021-04-01 MED ORDER — GABAPENTIN 300 MG PO CAPS: 300.0000 mg | ORAL_CAPSULE | Freq: Three times a day (TID) | ORAL | 2 refills | Status: DC

## 2021-04-01 NOTE — Progress Notes (Signed)
Subjective:     Chase Wyatt  Patient here for postoperative visit, status post right inguinal herniorrhaphy in January 2022.  Patient states he is still having a burning sensation in the right groin area extending down to the right testicle and right inner thigh.  It is made worse with movement.  He was having this pain sensation before the hernia surgery.  He is status post a left inguinal herniorrhaphy last year and is not having any symptomatology. Objective:    BP (!) 149/89   Pulse (!) 59   Temp 98.1 F (36.7 C) (Other (Comment))   Resp 12   Ht 6\' 4"  (1.93 m)   Wt 282 lb (127.9 kg)   SpO2 96%   BMI 34.33 kg/m   General:  alert, cooperative and no distress  Right groin incision well-healed.  No recurrent hernia present.  Some point tenderness noted along the right inguinal region.     Assessment:    Right groin pain with neuralgia and pain along the right ileoinguinal nerve distribution    Plan:   We will start gabapentin 300 mg 3 times daily and ibuprofen.  Will reexamine in 1 month to assess.

## 2021-04-01 NOTE — Patient Instructions (Signed)
Ibuprofen Oral Tablets and Capsules What is this medicine? IBUPROFEN (eye BYOO proe fen) is a non-steroidal anti-inflammatory drug, also known as an NSAID. It treats pain, inflammation, and swelling. It also reduces fever and minor aches and pains caused by the cold, flu, or a sore throat. This medicine may be used for other purposes; ask your health care provider or pharmacist if you have questions. COMMON BRAND NAME(S): Advil, Advil Junior Strength, Advil Migraine, Genpril, Ibren, IBU, Ibupak, Midol, Midol Cramps and Body Aches, Motrin, Motrin IB, Motrin Junior Strength, Motrin Migraine Pain, Samson-8, Toxicology Saliva Collection What should I tell my health care provider before I take this medicine? They need to know if you have any of these conditions:  bleeding disorder  coronary artery bypass graft (CABG) within the past 2 weeks  dehydration  diarrhea  heart attack  heart disease  heart failure  high blood pressure  if you often drink alcohol  kidney disease  liver disease  lung or breathing disease (asthma)  receiving steroids like dexamethasone or prednisone  smoke tobacco cigarettes  stomach bleeding  stomach ulcers, other stomach or intestine problems  stroke  take drugs that treat or prevent blood clots  vomiting  an unusual or allergic reaction to ibuprofen, other medicines, foods, dyes, or preservatives  pregnant or trying to get pregnant  breast-feeding How should I use this medicine? Take this drug by mouth. Take it as directed on the label. You can take it with or without food. If it upsets your stomach, take it with food. Talk to your health care provider about the use of this drug in children. While it may be prescribed for children as young as 12 for selected conditions, precautions do apply. Patients over 29 years of age may have a stronger reaction and need a smaller dose. If you get this drug as a prescription, a special MedGuide will be  given to you by the pharmacist with each prescription and refill. Be sure to read this information carefully each time. Overdosage: If you think you have taken too much of this medicine contact a poison control center or emergency room at once. NOTE: This medicine is only for you. Do not share this medicine with others. What if I miss a dose? If you take this drug on a regular basis, take it as soon as you can. If it is almost time for your next dose, take only that dose. Do not take double or extra doses. What may interact with this medicine? Do not take this medicine with any of the following medications:  cidofovir  ketorolac  methotrexate  pemetrexed This medicine may also interact with the following medications:  alcohol  aspirin  diuretics  lithium  other drugs for inflammation like prednisone  warfarin This list may not describe all possible interactions. Give your health care provider a list of all the medicines, herbs, non-prescription drugs, or dietary supplements you use. Also tell them if you smoke, drink alcohol, or use illegal drugs. Some items may interact with your medicine. What should I watch for while using this medicine? Visit your health care provider for regular checks on your progress. Tell your health care provider if your symptoms do not start to get better or if they get worse. A painful sore throat or sore throat with high fevers, headaches, nausea, or vomiting may be signs of a serious infection. Call your health care provider if these symptoms occur. Do not use this medicine for more than 2 days  or give to children under 25 years of age unless your health care provider tells you to. Do not take other medicines that contain aspirin, ibuprofen, or naproxen with this medicine. Side effects such as stomach upset, nausea, or ulcers may be more likely to occur. Many non-prescription medicines contain aspirin, ibuprofen, or naproxen. Always read labels  carefully. This medicine can cause serious ulcers and bleeding in the stomach. It can happen with no warning. Smoking, drinking alcohol, older age, and poor health can also increase risks. Call your health care provider right away if you have stomach pain or blood in your vomit or stool. This medicine does not prevent a heart attack or stroke. This medicine may increase the chance of a heart attack or stroke. The chance may increase the longer you use this medicine or if you have heart disease. If you take aspirin to prevent a heart attack or stroke, talk to your health care provider about using this medicine. Alcohol may interfere with the effect of this medicine. Avoid alcoholic drinks. This medicine may cause serious skin reactions. They can happen weeks to months after starting the medicine. Contact your health care provider right away if you notice fevers or flu-like symptoms with a rash. The rash may be red or purple and then turn into blisters or peeling of the skin. Or, you might notice a red rash with swelling of the face, lips or lymph nodes in your neck or under your arms. Talk to your health care provider if you are pregnant before taking this medicine. Taking this medicine between weeks 20 and 30 of pregnancy may harm your unborn baby. Your health care provider will monitor you closely if you need to take it. After 30 weeks of pregnancy, do not take this medicine. You may get drowsy or dizzy. Do not drive, use machinery, or do anything that needs mental alertness until you know how this medicine affects you. Do not stand up or sit up quickly, especially if you are an older patient. This reduces the risk of dizzy or fainting spells. Be careful brushing or flossing your teeth or using a toothpick because you may get an infection or bleed more easily. If you have any dental work done, tell your dentist you are receiving this medicine. This medicine may make it more difficult to get pregnant. Talk  to your health care provider if you are concerned about your fertility. What side effects may I notice from receiving this medicine? Side effects that you should report to your doctor or health care provider as soon as possible:  allergic reactions (skin rash, itching or hives; swelling of the face, lips, or tongue)  aseptic meningitis (stiff neck; sensitivity to light; headache; drowsiness; fever; nausea, vomiting; rash)  bleeding (bloody or black, tarry stools; red or dark brown urine; spitting up blood or brown material that looks like coffee grounds; red spots on the skin; unusual bruising or bleeding from the eyes, gums, or nose)  blurred vision OR changes in vision  heart attack (trouble breathing; pain or tightness in the chest, neck, back or arms; unusually weak or tired)  heart failure (trouble breathing; fast, irregular heartbeat; sudden weight gain; swelling of the ankles, feet, hands; unusually weak or tired)  high potassium levels (chest pain; fast, irregular heartbeat; muscle weakness)  increase in blood pressure  infection (fever, chills, cough, sore throat, pain or trouble passing urine)  kidney injury (trouble passing urine or change in the amount of urine)  liver injury (  dark yellow or brown urine; general ill feeling or flu-like symptoms; loss of appetite, right upper belly pain; unusually weak or tired, yellowing of the eyes or skin)  low blood pressure (dizziness; feeling faint or lightheaded, falls; unusually weak or tired)  low red blood cell counts (trouble breathing; feeling faint; lightheaded, falls; unusually weak or tired)  rash, fever, and swollen lymph nodes  redness, blistering, peeling, or loosening of the skin, including inside the mouth  stroke (changes in vision; confusion; trouble speaking or understanding; severe headaches; sudden numbness or weakness of the face, arm or leg; trouble walking; dizziness; loss of balance or coordination) Side  effects that usually do not require medical attention (report to your doctor or health care provider if they continue or are bothersome):  cough  constipation  diarrhea  dizziness  headache  upset stomach  vomiting This list may not describe all possible side effects. Call your doctor for medical advice about side effects. You may report side effects to FDA at 1-800-FDA-1088. Where should I keep my medicine? Keep out of the reach of children and pets. Store at room temperature between 20 and 25 degrees C (68 and 77 degrees F). Get rid of any unused medicine after the expiration date. To get rid of medicines that are no longer needed or have expired:  Take the medicine to a medicine take-back program. Check with your pharmacy or law enforcement to find a location.  If you cannot return the medicine, check the label or package insert to see if the medicine should be thrown out in the garbage or flushed down the toilet. If you are not sure, ask your health care provider. If it is safe to put it in the trash, empty the medicine out of the container. Mix the medicine with cat litter, dirt, coffee grounds, or other unwanted substance. Seal the mixture in a bag or container. Put it in the trash. NOTE: This sheet is a summary. It may not cover all possible information. If you have questions about this medicine, talk to your doctor, pharmacist, or health care provider.  2021 Elsevier/Gold Standard (2020-05-01 12:06:36) Gabapentin capsules or tablets What is this medicine? GABAPENTIN (GA ba pen tin) is used to control seizures in certain types of epilepsy. It is also used to treat certain types of nerve pain. This medicine may be used for other purposes; ask your health care provider or pharmacist if you have questions. COMMON BRAND NAME(S): Active-PAC with Gabapentin, Orpha Bur, Gralise, Neurontin What should I tell my health care provider before I take this medicine? They need to know if you  have any of these conditions:  history of drug abuse or alcohol abuse problem  kidney disease  lung or breathing disease  suicidal thoughts, plans, or attempt; a previous suicide attempt by you or a family member  an unusual or allergic reaction to gabapentin, other medicines, foods, dyes, or preservatives  pregnant or trying to get pregnant  breast-feeding How should I use this medicine? Take this medicine by mouth with a glass of water. Follow the directions on the prescription label. You can take it with or without food. If it upsets your stomach, take it with food. Take your medicine at regular intervals. Do not take it more often than directed. Do not stop taking except on your doctor's advice. If you are directed to break the 600 or 800 mg tablets in half as part of your dose, the extra half tablet should be used for the next  dose. If you have not used the extra half tablet within 28 days, it should be thrown away. A special MedGuide will be given to you by the pharmacist with each prescription and refill. Be sure to read this information carefully each time. Talk to your pediatrician regarding the use of this medicine in children. While this drug may be prescribed for children as young as 3 years for selected conditions, precautions do apply. Overdosage: If you think you have taken too much of this medicine contact a poison control center or emergency room at once. NOTE: This medicine is only for you. Do not share this medicine with others. What if I miss a dose? If you miss a dose, take it as soon as you can. If it is almost time for your next dose, take only that dose. Do not take double or extra doses. What may interact with this medicine? This medicine may interact with the following medications:  alcohol  antihistamines for allergy, cough, and cold  certain medicines for anxiety or sleep  certain medicines for depression like amitriptyline, fluoxetine,  sertraline  certain medicines for seizures like phenobarbital, primidone  certain medicines for stomach problems  general anesthetics like halothane, isoflurane, methoxyflurane, propofol  local anesthetics like lidocaine, pramoxine, tetracaine  medicines that relax muscles for surgery  narcotic medicines for pain  phenothiazines like chlorpromazine, mesoridazine, prochlorperazine, thioridazine This list may not describe all possible interactions. Give your health care provider a list of all the medicines, herbs, non-prescription drugs, or dietary supplements you use. Also tell them if you smoke, drink alcohol, or use illegal drugs. Some items may interact with your medicine. What should I watch for while using this medicine? Visit your doctor or health care provider for regular checks on your progress. You may want to keep a record at home of how you feel your condition is responding to treatment. You may want to share this information with your doctor or health care provider at each visit. You should contact your doctor or health care provider if your seizures get worse or if you have any new types of seizures. Do not stop taking this medicine or any of your seizure medicines unless instructed by your doctor or health care provider. Stopping your medicine suddenly can increase your seizures or their severity. This medicine may cause serious skin reactions. They can happen weeks to months after starting the medicine. Contact your health care provider right away if you notice fevers or flu-like symptoms with a rash. The rash may be red or purple and then turn into blisters or peeling of the skin. Or, you might notice a red rash with swelling of the face, lips or lymph nodes in your neck or under your arms. Wear a medical identification bracelet or chain if you are taking this medicine for seizures, and carry a card that lists all your medications. You may get drowsy, dizzy, or have blurred vision.  Do not drive, use machinery, or do anything that needs mental alertness until you know how this medicine affects you. To reduce dizzy or fainting spells, do not sit or stand up quickly, especially if you are an older patient. Alcohol can increase drowsiness and dizziness. Avoid alcoholic drinks. Your mouth may get dry. Chewing sugarless gum or sucking hard candy, and drinking plenty of water will help. The use of this medicine may increase the chance of suicidal thoughts or actions. Pay special attention to how you are responding while on this medicine. Any worsening of  mood, or thoughts of suicide or dying should be reported to your health care provider right away. Women who become pregnant while using this medicine may enroll in the [place] Pregnancy Registry by calling [phone]. This registry collects information about the safety of antiepileptic drug use during pregnancy. What side effects may I notice from receiving this medicine? Side effects that you should report to your doctor or health care professional as soon as possible:  allergic reactions like skin rash, itching or hives, swelling of the face, lips, or tongue  breathing problems  rash, fever, and swollen lymph nodes  redness, blistering, peeling or loosening of the skin, including inside the mouth  suicidal thoughts, mood changes Side effects that usually do not require medical attention (report to your doctor or health care professional if they continue or are bothersome):  dizziness  drowsiness  headache  nausea, vomiting  swelling of ankles, feet, hands  tiredness This list may not describe all possible side effects. Call your doctor for medical advice about side effects. You may report side effects to FDA at 1-800-FDA-1088. Where should I keep my medicine? Keep out of reach of children. This medicine may cause accidental overdose and death if it taken by other adults, children, or  pets. Mix any unused medicine with a substance like cat litter or coffee grounds. Then throw the medicine away in a sealed container like a sealed bag or a coffee can with a lid. Do not use the medicine after the expiration date. Store at room temperature between 15 and 30 degrees C (59 and 86 degrees F). NOTE: This sheet is a summary. It may not cover all possible information. If you have questions about this medicine, talk to your doctor, pharmacist, or health care provider.  2021 Elsevier/Gold Standard (2019-03-08 14:16:43)

## 2021-04-09 ENCOUNTER — Telehealth: Payer: Self-pay | Admitting: Family Medicine

## 2021-04-09 ENCOUNTER — Encounter: Payer: Self-pay | Admitting: General Surgery

## 2021-04-09 NOTE — Telephone Encounter (Signed)
Pt called 04/08/2021 stating that he is still having some pain and the Gabapentin is not helping at all. Per Dr. Arnoldo Morale there is nothing more he can do medically to help patient with this. He just needs to give it more time and if he thinks the med is not helping he can stop taking it.   04/09/2021 - Talked to patient's wife and informed her of providers recommendations.

## 2021-04-13 ENCOUNTER — Other Ambulatory Visit: Payer: Self-pay | Admitting: Family Medicine

## 2021-04-13 DIAGNOSIS — R1031 Right lower quadrant pain: Secondary | ICD-10-CM

## 2021-04-19 ENCOUNTER — Ambulatory Visit (INDEPENDENT_AMBULATORY_CARE_PROVIDER_SITE_OTHER): Payer: BC Managed Care – PPO | Admitting: Urology

## 2021-04-19 ENCOUNTER — Other Ambulatory Visit: Payer: Self-pay

## 2021-04-19 VITALS — BP 157/80 | HR 70 | Temp 98.7°F | Ht 76.0 in | Wt 270.0 lb

## 2021-04-19 DIAGNOSIS — R1031 Right lower quadrant pain: Secondary | ICD-10-CM

## 2021-04-19 LAB — URINALYSIS, ROUTINE W REFLEX MICROSCOPIC
Bilirubin, UA: NEGATIVE
Glucose, UA: NEGATIVE
Ketones, UA: NEGATIVE
Leukocytes,UA: NEGATIVE
Nitrite, UA: NEGATIVE
Protein,UA: NEGATIVE
RBC, UA: NEGATIVE
Specific Gravity, UA: 1.015 (ref 1.005–1.030)
Urobilinogen, Ur: 0.2 mg/dL (ref 0.2–1.0)
pH, UA: 6.5 (ref 5.0–7.5)

## 2021-04-19 NOTE — Progress Notes (Signed)
Urological Symptom Review  Patient is experiencing the following symptoms: none   Review of Systems  Gastrointestinal (upper)  : Negative for upper GI symptoms  Gastrointestinal (lower) : Negative for lower GI symptoms  Constitutional : Negative for symptoms  Skin: Negative for skin symptoms  Eyes: Negative for eye symptoms  Ear/Nose/Throat : Negative for Ear/Nose/Throat symptoms  Hematologic/Lymphatic: Negative for Hematologic/Lymphatic symptoms  Cardiovascular : Negative for cardiovascular symptoms  Respiratory : Negative for respiratory symptoms  Endocrine: Negative for endocrine symptoms  Musculoskeletal: Back pain  Neurological: Negative for neurological symptoms  Psychologic: Negative for psychiatric symptoms

## 2021-04-19 NOTE — Progress Notes (Addendum)
04/19/2021 2:04 PM   Chase Wyatt 27-Nov-1971 [id number]  Referring provider: [name], MD [place],  [place]  CC: Right inguinal pain.  HPI:  New patient for-  1) right inguinal pain- patient had right inguinal pain and underwent CT scan of the pelvis January 2022. Of note the scan also said left inguinal pain. CT was benign from a GU point of view and showed a fat-containing right inguinal hernia. A spinal stimulator was present. He underwent right inguinal hernia repair with mesh. He had a left inguinal hernia repair with mesh in 2021. He continued to have some right inguinal pain.  He was treated with gabapentin with no help. Hurts worse with activity - such as steps, marital arts. He is doing some stretching. Dr. Arnoldo Morale mentioned nerve ablation. Testicle don't hurt - tender in the inguinal canal.   He has a h/o back surgery, spinal stimulator, right inguinal hernia repair as above.   UA is clear. AUASS =2, delighted. No FH of PCa.   PMH: Past Medical History:  Diagnosis Date  . Anxiety   . Arthritis   . GERD (gastroesophageal reflux disease)   . Heart murmur   . Hypertension   . Joint pain   . Neuromuscular disorder ([place])    nerve damage right leg  . PONV (postoperative nausea and vomiting)   . Sleep apnea     Surgical History: Past Surgical History:  Procedure Laterality Date  . ANKLE DEBRIDEMENT  3 yrs ago -[place]   left  . 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 BACK SURGERY     multiple  . CARDIAC CATHETERIZATION  2013   [place], no stents  . CHOLECYSTECTOMY  10/28/2011   Procedure: LAPAROSCOPIC CHOLECYSTECTOMY;  Surgeon: Jamesetta So;  Location: [facility];  Service: General;  Laterality: N/A;  . INGUINAL HERNIA REPAIR Left 08/07/2020   Procedure: HERNIA REPAIR INGUINAL ADULT using MESH MARLEX PLUG MEDIUM;  Surgeon: Aviva Signs, MD;  Location: [facility];  Service: General;  Laterality: Left;  .  INGUINAL HERNIA REPAIR Right 01/11/2021   Procedure: HERNIA REPAIR INGUINAL ADULT;  Surgeon: Aviva Signs, MD;  Location: [facility];  Service: General;  Laterality: Right;  . LAMINECTOMY THORACIC SPINE W/ PLACEMENT SPINAL CORD STIMULATOR    . SHOULDER OPEN ROTATOR CUFF REPAIR Left    [place]  . UMBILICAL HERNIA REPAIR N/A 08/07/2020   Procedure: HERNIA REPAIR UMBILICAL ADULT;  Surgeon: Aviva Signs, MD;  Location: [facility];  Service: General;  Laterality: N/A;    Home Medications:  Allergies as of 04/19/2021      Reactions   Benzoin Rash   Blisters   Freederm Adhesive Remover [new Skin] Itching   Meloxicam Hives   Other    Oxycontin [oxycodone] Other (See Comments)   Headache      Medication List       Accurate as of Apr 19, 2021  2:04 PM. If you have any questions, ask your nurse or doctor.        ALPRAZolam 0.5 MG tablet Commonly known as: XANAX Take 0.5 mg by mouth 3 (three) times daily as needed for anxiety or sleep.   cyclobenzaprine 10 MG tablet Commonly known as: FLEXERIL Take 10 mg by mouth every 8 (eight) hours as needed for muscle spasms.   eletriptan 40 MG tablet Commonly known as: RELPAX Take 40 mg by mouth daily as needed for migraine.   gabapentin 300  MG capsule Commonly known as: NEURONTIN Take 1 capsule (300 mg total) by mouth 3 (three) times daily.   HYDROcodone-acetaminophen 10-325 MG tablet Commonly known as: Norco Take 1 tablet by mouth every 6 (six) hours as needed.   levocetirizine 5 MG tablet Commonly known as: XYZAL Take 5 mg by mouth daily.   mometasone 50 MCG/ACT nasal spray Commonly known as: NASONEX Place 2 sprays into the nose daily.   multivitamin tablet Take 1 tablet by mouth daily.   naproxen 500 MG tablet Commonly known as: NAPROSYN Take 500 mg by mouth daily as needed for migraine.   omeprazole 40 MG capsule Commonly known as: PRILOSEC Take 40 mg by mouth daily.   ondansetron 4 MG tablet Commonly known as: ZOFRAN Take 4 mg  by mouth every 8 (eight) hours as needed for nausea or vomiting.   testosterone cypionate 200 MG/ML injection Commonly known as: DEPOTESTOSTERONE CYPIONATE Inject 400 mg into the muscle every 14 (fourteen) days.   valACYclovir 1000 MG tablet Commonly known as: VALTREX Take 2,000 mg by mouth 2 (two) times daily as needed (fever blisters).       Allergies:  Allergies  Allergen Reactions  . Benzoin Rash    Blisters  . Freederm Adhesive Remover [New Skin] Itching  . Meloxicam Hives  . Other   . Oxycontin [Oxycodone] Other (See Comments)    Headache    Family History: Family History  Problem Relation Age of Onset  . Hypotension Neg Hx   . Anesthesia problems Neg Hx   . Malignant hyperthermia Neg Hx   . Pseudochol deficiency Neg Hx     Social History:  reports that he has never smoked. He has quit using smokeless tobacco. He reports current alcohol use of about 1.0 standard drink of alcohol per week. He reports that he does not use drugs.   Physical Exam: BP (!) 157/80   Pulse 70   Temp 98.7 F (37.1 C)   Ht 6\' 4"  (1.93 m)   Wt 270 lb (122.5 kg)   BMI 32.87 kg/m   Constitutional:  Alert and oriented, No acute distress. HEENT: [place] AT, moist mucus membranes.  Trachea midline, no masses. Cardiovascular: No clubbing, cyanosis, or edema. Respiratory: Normal respiratory effort, no increased work of breathing. GI: Abdomen is soft, nontender, nondistended, no abdominal masses Skin: No rashes, bruises or suspicious lesions. Neurologic: Grossly intact, no focal deficits, moving all 4 extremities. Psychiatric: Normal mood and affect. GU: Penis circumcised, normal foreskin, testicles descended bilaterally and palpably normal, no testicle pain; bilateral epididymis palpably normal, scrotum normal DRE: Prostate 25 g, smooth without hard area or nodule   Laboratory Data: Lab Results  Component Value Date   WBC 6.8 01/07/2021   HGB 15.6 01/07/2021   HCT 45.8 01/07/2021   MCV  87.2 01/07/2021   PLT 145 (L) 01/07/2021    Lab Results  Component Value Date   CREATININE 1.17 01/07/2021    No results found for: PSA  No results found for: TESTOSTERONE  No results found for: HGBA1C  Urinalysis No results found for: COLORURINE, APPEARANCEUR, LABSPEC, PHURINE, GLUCOSEU, HGBUR, BILIRUBINUR, KETONESUR, PROTEINUR, UROBILINOGEN, NITRITE, LEUKOCYTESUR  No results found for: [place], [place], RBCUA, LABEPIT, MUCUS, BACTERIA  Pertinent Imaging: CT jan 2022 - images reviewed  No results found for this or any previous visit.  No results found for this or any previous visit.  No results found for this or any previous visit.  No results found for this or any previous visit.  No results found for this or any previous visit.  No results found for this or any previous visit.  No results found for this or any previous visit.  No results found for this or any previous visit.   Assessment & Plan:    1. Right inguinal pain - Discussed his imaging and exam are benign from a urologic point of view.  Unfortunately I do not have a lot to offer him.  We discussed a lot of inguinal pain is referred from hip or back issues. One thing we have found helpful over time as focused physical therapy that incorporates the pelvic floor, inguinal region and back and hips in these patients.  Offered to refer him to physical therapy but he lives up in [place] and doesn't want to pursue that. He will follow-up with his PCP and Gen Surgeon. I will reach out to Dr. Arnoldo Morale. I spoke to my colleague Dr. Alyson Ingles and he agreed it sounds like a high issues (above the testicle) and not likely we can do anything here.    - Urinalysis, Routine w reflex microscopic   No follow-ups on file.  Festus Aloe, MD

## 2021-04-22 ENCOUNTER — Ambulatory Visit: Payer: BC Managed Care – PPO | Admitting: General Surgery

## 2021-04-23 ENCOUNTER — Ambulatory Visit (INDEPENDENT_AMBULATORY_CARE_PROVIDER_SITE_OTHER): Payer: BC Managed Care – PPO | Admitting: Urology

## 2021-04-23 ENCOUNTER — Other Ambulatory Visit: Payer: Self-pay

## 2021-04-23 VITALS — BP 170/81 | HR 74 | Temp 98.6°F | Ht 76.0 in | Wt 270.0 lb

## 2021-04-23 DIAGNOSIS — R1031 Right lower quadrant pain: Secondary | ICD-10-CM

## 2021-04-23 NOTE — Progress Notes (Signed)
Urological Symptom Review  Patient is experiencing the following symptoms: none   Review of Systems  Gastrointestinal (upper)  : Negative for upper GI symptoms  Gastrointestinal (lower) : Negative for lower GI symptoms  Constitutional :  Negative for symptoms   Skin: negative  Eyes: Negative for eye symptoms  Ear/Nose/Throat : Negative for Ear/Nose/Throat symptoms  Hematologic/Lymphatic: Negative for Hematologic/Lymphatic symptoms  Cardiovascular : Negative for cardiovascular symptoms  Respiratory : Negative for respiratory symptoms  Endocrine: Negative for endocrine symptoms  Musculoskeletal: Negative for musculoskeletal symptoms  Neurological: Negative for neurological symptoms  Psychologic: Negative for psychiatric symptoms  

## 2021-04-23 NOTE — Progress Notes (Signed)
04/23/2021 1:43 PM   Chase Wyatt 1971-05-29 [id number]  Referring provider: [name], MD [place],  [place]  Right groin pain  HPI: [name] is a 50yo here for evaluation of right groin pain. He underwent right hernia repair in January 2022 with Dr. Arnoldo Morale. Since then he has been having intermittent right testis and inguinal pain. The pain is worse with martial arts and sneezing.    PMH: Past Medical History:  Diagnosis Date  . Anxiety   . Arthritis   . GERD (gastroesophageal reflux disease)   . Heart murmur   . Hypertension   . Joint pain   . Neuromuscular disorder ([place])    nerve damage right leg  . PONV (postoperative nausea and vomiting)   . Sleep apnea     Surgical History: Past Surgical History:  Procedure Laterality Date  . ANKLE DEBRIDEMENT  3 yrs ago -[place]   left  . 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 BACK SURGERY     multiple  . CARDIAC CATHETERIZATION  2013   [place], no stents  . CHOLECYSTECTOMY  10/28/2011   Procedure: LAPAROSCOPIC CHOLECYSTECTOMY;  Surgeon: Jamesetta So;  Location: [facility];  Service: General;  Laterality: N/A;  . INGUINAL HERNIA REPAIR Left 08/07/2020   Procedure: HERNIA REPAIR INGUINAL ADULT using MESH MARLEX PLUG MEDIUM;  Surgeon: Aviva Signs, MD;  Location: [facility];  Service: General;  Laterality: Left;  . INGUINAL HERNIA REPAIR Right 01/11/2021   Procedure: HERNIA REPAIR INGUINAL ADULT;  Surgeon: Aviva Signs, MD;  Location: [facility];  Service: General;  Laterality: Right;  . LAMINECTOMY THORACIC SPINE W/ PLACEMENT SPINAL CORD STIMULATOR    . SHOULDER OPEN ROTATOR CUFF REPAIR Left    [place]  . UMBILICAL HERNIA REPAIR N/A 08/07/2020   Procedure: HERNIA REPAIR UMBILICAL ADULT;  Surgeon: Aviva Signs, MD;  Location: [facility];  Service: General;  Laterality: N/A;    Home Medications:  Allergies as of 04/23/2021      Reactions   Benzoin Rash   Blisters    Freederm Adhesive Remover [new Skin] Itching   Meloxicam Hives   Other    Oxycontin [oxycodone] Other (See Comments)   Headache      Medication List       Accurate as of Apr 23, 2021  1:43 PM. If you have any questions, ask your nurse or doctor.        ALPRAZolam 0.5 MG tablet Commonly known as: XANAX Take 0.5 mg by mouth 3 (three) times daily as needed for anxiety or sleep.   cyclobenzaprine 10 MG tablet Commonly known as: FLEXERIL Take 10 mg by mouth every 8 (eight) hours as needed for muscle spasms.   diclofenac 75 MG EC tablet Commonly known as: VOLTAREN Take 75 mg by mouth 2 (two) times daily.   eletriptan 40 MG tablet Commonly known as: RELPAX Take 40 mg by mouth daily as needed for migraine.   gabapentin 300 MG capsule Commonly known as: NEURONTIN Take 1 capsule (300 mg total) by mouth 3 (three) times daily.   HYDROcodone-acetaminophen 10-325 MG tablet Commonly known as: Norco Take 1 tablet by mouth every 6 (six) hours as needed.   levocetirizine 5 MG tablet Commonly known as: XYZAL Take 5 mg by mouth daily.   mometasone 50 MCG/ACT nasal spray Commonly known as: NASONEX Place 2 sprays into the nose daily.   multivitamin tablet Take 1 tablet by mouth  daily.   naproxen 500 MG tablet Commonly known as: NAPROSYN Take 500 mg by mouth daily as needed for migraine.   omeprazole 40 MG capsule Commonly known as: PRILOSEC Take 40 mg by mouth daily.   ondansetron 4 MG tablet Commonly known as: ZOFRAN Take 4 mg by mouth every 8 (eight) hours as needed for nausea or vomiting.   testosterone cypionate 200 MG/ML injection Commonly known as: DEPOTESTOSTERONE CYPIONATE Inject 400 mg into the muscle every 14 (fourteen) days.   valACYclovir 1000 MG tablet Commonly known as: VALTREX Take 2,000 mg by mouth 2 (two) times daily as needed (fever blisters).       Allergies:  Allergies  Allergen Reactions  . Benzoin Rash    Blisters  . Freederm Adhesive  Remover [New Skin] Itching  . Meloxicam Hives  . Other   . Oxycontin [Oxycodone] Other (See Comments)    Headache    Family History: Family History  Problem Relation Age of Onset  . Hypotension Neg Hx   . Anesthesia problems Neg Hx   . Malignant hyperthermia Neg Hx   . Pseudochol deficiency Neg Hx     Social History:  reports that he has never smoked. He has quit using smokeless tobacco. He reports current alcohol use of about 1.0 standard drink of alcohol per week. He reports that he does not use drugs.  ROS: All other review of systems were reviewed and are negative except what is noted above in HPI  Physical Exam: BP (!) 170/81   Pulse 74   Temp 98.6 F (37 C)   Ht 6\' 4"  (1.93 m)   Wt 270 lb (122.5 kg)   BMI 32.87 kg/m   Constitutional:  Alert and oriented, No acute distress. HEENT: [place] AT, moist mucus membranes.  Trachea midline, no masses. Cardiovascular: No clubbing, cyanosis, or edema. Respiratory: Normal respiratory effort, no increased work of breathing. GI: Abdomen is soft, nontender, nondistended, no abdominal masses GU: No CVA tenderness. Circumcised phallus. No masses/lesions on penis, testis, scrotum.    Lymph: No cervical or inguinal lymphadenopathy. Skin: No rashes, bruises or suspicious lesions. Neurologic: Grossly intact, no focal deficits, moving all 4 extremities. Psychiatric: Normal mood and affect.  Laboratory Data: Lab Results  Component Value Date   WBC 6.8 01/07/2021   HGB 15.6 01/07/2021   HCT 45.8 01/07/2021   MCV 87.2 01/07/2021   PLT 145 (L) 01/07/2021    Lab Results  Component Value Date   CREATININE 1.17 01/07/2021    No results found for: PSA  No results found for: TESTOSTERONE  No results found for: HGBA1C  Urinalysis    Component Value Date/Time   APPEARANCEUR Clear 04/19/2021 1404   GLUCOSEU Negative 04/19/2021 1404   BILIRUBINUR Negative 04/19/2021 1404   PROTEINUR Negative 04/19/2021 1404   NITRITE Negative  04/19/2021 1404   LEUKOCYTESUR Negative 04/19/2021 1404    Lab Results  Component Value Date   LABMICR Comment 04/19/2021    Pertinent Imaging:  No results found for this or any previous visit.  No results found for this or any previous visit.  No results found for this or any previous visit.  No results found for this or any previous visit.  No results found for this or any previous visit.  No results found for this or any previous visit.  No results found for this or any previous visit.  No results found for this or any previous visit.   Assessment & Plan:    1.  Right inguinal pain -RTC 1 week for cord block - Urinalysis, Routine w reflex microscopic   No follow-ups on file.  [name], MD  [facility] [place]

## 2021-04-26 ENCOUNTER — Other Ambulatory Visit: Payer: Self-pay

## 2021-04-26 ENCOUNTER — Ambulatory Visit (INDEPENDENT_AMBULATORY_CARE_PROVIDER_SITE_OTHER): Payer: BC Managed Care – PPO | Admitting: Urology

## 2021-04-26 VITALS — BP 176/80 | HR 69 | Temp 98.1°F

## 2021-04-26 DIAGNOSIS — R1031 Right lower quadrant pain: Secondary | ICD-10-CM | POA: Diagnosis not present

## 2021-04-26 MED ORDER — BUPIVACAINE HCL 0.5 % IJ SOLN
10.0000 mL | Freq: Once | INTRAMUSCULAR | Status: AC
  Administered 2021-04-26: 10 mL

## 2021-04-26 MED ORDER — LIDOCAINE HCL 2 % IJ SOLN
10.0000 mL | Freq: Once | INTRAMUSCULAR | Status: AC
  Administered 2021-04-26: 200 mg

## 2021-04-26 NOTE — Progress Notes (Signed)
04/26/2021 4:15 PM   [name] 1971/01/11 [id number]  Referring provider: [name], MD [place],  [place]  followup right testis pain  HPI: [name] is a 50yo here for followup for right testis/inguinal pain. He is here for a cord block   PMH: Past Medical History:  Diagnosis Date  . Anxiety   . Arthritis   . GERD (gastroesophageal reflux disease)   . Heart murmur   . Hypertension   . Joint pain   . Neuromuscular disorder ([place])    nerve damage right leg  . PONV (postoperative nausea and vomiting)   . Sleep apnea     Surgical History: Past Surgical History:  Procedure Laterality Date  . ANKLE DEBRIDEMENT  3 yrs ago -[place]   left  . 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 BACK SURGERY     multiple  . CARDIAC CATHETERIZATION  2013   [place], no stents  . CHOLECYSTECTOMY  10/28/2011   Procedure: LAPAROSCOPIC CHOLECYSTECTOMY;  Surgeon: Jamesetta So;  Location: [facility];  Service: General;  Laterality: N/A;  . INGUINAL HERNIA REPAIR Left 08/07/2020   Procedure: HERNIA REPAIR INGUINAL ADULT using MESH MARLEX PLUG MEDIUM;  Surgeon: Aviva Signs, MD;  Location: [facility];  Service: General;  Laterality: Left;  . INGUINAL HERNIA REPAIR Right 01/11/2021   Procedure: HERNIA REPAIR INGUINAL ADULT;  Surgeon: Aviva Signs, MD;  Location: [facility];  Service: General;  Laterality: Right;  . LAMINECTOMY THORACIC SPINE W/ PLACEMENT SPINAL CORD STIMULATOR    . SHOULDER OPEN ROTATOR CUFF REPAIR Left    [place]  . UMBILICAL HERNIA REPAIR N/A 08/07/2020   Procedure: HERNIA REPAIR UMBILICAL ADULT;  Surgeon: Aviva Signs, MD;  Location: [facility];  Service: General;  Laterality: N/A;    Home Medications:  Allergies as of 04/26/2021      Reactions   Benzoin Rash   Blisters   Freederm Adhesive Remover [new Skin] Itching   Meloxicam Hives   Other    Oxycontin [oxycodone] Other (See Comments)   Headache      Medication  List       Accurate as of Apr 26, 2021  4:15 PM. If you have any questions, ask your nurse or doctor.        ALPRAZolam 0.5 MG tablet Commonly known as: XANAX Take 0.5 mg by mouth 3 (three) times daily as needed for anxiety or sleep.   cyclobenzaprine 10 MG tablet Commonly known as: FLEXERIL Take 10 mg by mouth every 8 (eight) hours as needed for muscle spasms.   diclofenac 75 MG EC tablet Commonly known as: VOLTAREN Take 75 mg by mouth 2 (two) times daily.   eletriptan 40 MG tablet Commonly known as: RELPAX Take 40 mg by mouth daily as needed for migraine.   gabapentin 300 MG capsule Commonly known as: NEURONTIN Take 1 capsule (300 mg total) by mouth 3 (three) times daily.   HYDROcodone-acetaminophen 10-325 MG tablet Commonly known as: Norco Take 1 tablet by mouth every 6 (six) hours as needed.   levocetirizine 5 MG tablet Commonly known as: XYZAL Take 5 mg by mouth daily.   mometasone 50 MCG/ACT nasal spray Commonly known as: NASONEX Place 2 sprays into the nose daily.   multivitamin tablet Take 1 tablet by mouth daily.   naproxen 500 MG tablet Commonly known as: NAPROSYN Take 500 mg by mouth daily as needed for migraine.   omeprazole 40  MG capsule Commonly known as: PRILOSEC Take 40 mg by mouth daily.   ondansetron 4 MG tablet Commonly known as: ZOFRAN Take 4 mg by mouth every 8 (eight) hours as needed for nausea or vomiting.   testosterone cypionate 200 MG/ML injection Commonly known as: DEPOTESTOSTERONE CYPIONATE Inject 400 mg into the muscle every 14 (fourteen) days.   valACYclovir 1000 MG tablet Commonly known as: VALTREX Take 2,000 mg by mouth 2 (two) times daily as needed (fever blisters).       Allergies:  Allergies  Allergen Reactions  . Benzoin Rash    Blisters  . Freederm Adhesive Remover [New Skin] Itching  . Meloxicam Hives  . Other   . Oxycontin [Oxycodone] Other (See Comments)    Headache    Family History: Family History   Problem Relation Age of Onset  . Hypotension Neg Hx   . Anesthesia problems Neg Hx   . Malignant hyperthermia Neg Hx   . Pseudochol deficiency Neg Hx     Social History:  reports that he has never smoked. He has quit using smokeless tobacco. He reports current alcohol use of about 1.0 standard drink of alcohol per week. He reports that he does not use drugs.  ROS: All other review of systems were reviewed and are negative except what is noted above in HPI  Physical Exam: BP (!) 176/80   Pulse 69   Temp 98.1 F (36.7 C)   Constitutional:  Alert and oriented, No acute distress. HEENT: [place] AT, moist mucus membranes.  Trachea midline, no masses. Cardiovascular: No clubbing, cyanosis, or edema. Respiratory: Normal respiratory effort, no increased work of breathing. GI: Abdomen is soft, nontender, nondistended, no abdominal masses GU: No CVA tenderness.  Lymph: No cervical or inguinal lymphadenopathy. Skin: No rashes, bruises or suspicious lesions. Neurologic: Grossly intact, no focal deficits, moving all 4 extremities. Psychiatric: Normal mood and affect.  Laboratory Data: Lab Results  Component Value Date   WBC 6.8 01/07/2021   HGB 15.6 01/07/2021   HCT 45.8 01/07/2021   MCV 87.2 01/07/2021   PLT 145 (L) 01/07/2021    Lab Results  Component Value Date   CREATININE 1.17 01/07/2021    No results found for: PSA  No results found for: TESTOSTERONE  No results found for: HGBA1C  Urinalysis    Component Value Date/Time   APPEARANCEUR Clear 04/19/2021 1404   GLUCOSEU Negative 04/19/2021 1404   BILIRUBINUR Negative 04/19/2021 1404   PROTEINUR Negative 04/19/2021 1404   NITRITE Negative 04/19/2021 1404   LEUKOCYTESUR Negative 04/19/2021 1404    Lab Results  Component Value Date   LABMICR Comment 04/19/2021    Pertinent Imaging:  No results found for this or any previous visit.  No results found for this or any previous visit.  No results found for this or  any previous visit.  No results found for this or any previous visit.  No results found for this or any previous visit.  No results found for this or any previous visit.  No results found for this or any previous visit.  No results found for this or any previous visit.  Cord block procedure  The patients right inguinal region was prepped with betadine. We then proceeded to inject 4ml of a half 0.5% bupivicaine and half 2% lidocaine on the floor of the right spermatic cord and then an additional 40ml of the same solution at the top of the cord. The patient tolerated the procedure well   Assessment & Plan:  1. Right inguinal/testis pain -Cord block performed today. Patient to call if the block works and we will then proceed with neurolysis    Return in about 2 weeks (around 05/10/2021) for symptom check.  [name], MD  [facility] [place]

## 2021-04-26 NOTE — Progress Notes (Signed)

## 2021-04-27 ENCOUNTER — Encounter: Payer: Self-pay | Admitting: Urology

## 2021-04-27 NOTE — Patient Instructions (Signed)
Orchitis  Orchitis is inflammation of a testicle. Testicles are the male organs that produce sperm. The testicles are held in a fleshy sac (scrotum) located behind the penis. Orchitis usually affects only one testicle, but it can affect both. Orchitis is caused by infection. Many kinds of bacteria and viruses can cause this infection. The condition can develop suddenly. What are the causes? This condition may be caused by:  Infection from viruses or bacteria.  Other organisms, such as fungi or parasites (rare). This is common in men who have a weak body defense system (immune system), such as men with HIV. Bacteria   Bacterial orchitis often occurs along with an infection of the tube that collects and stores sperm (epididymis).  In men who are not sexually active, this infection usually starts as a urinary tract infection and spreads to the testicle.  In sexually active men, sexually transmitted infections (STIs) are the most common cause of bacterial orchitis. These can include: ? Gonorrhea. ? Chlamydia. Viruses  Mumps is the most common cause of viral orchitis, though mumps is now rare in many areas because of vaccination.  Other viruses that can cause orchitis include: ? The chickenpox virus (varicella-zoster virus). ? The virus that causes mononucleosis (Epstein-Barr virus). What increases the risk? The following factors may make you more likely to develop this condition:  For viral orchitis: ? Not having been vaccinated against mumps.  For bacterial orchitis: ? Having had frequent urinary tract infections. ? Engaging in high-risk sexual behaviors, such as having multiple sexual partners or having sex without using a condom. ? Having a sexual partner with an STI. ? Having had urinary tract surgery. ? Using a tube that is passed through the penis to drain urine (Foley catheter). ? Having an enlarged prostate gland. What are the signs or symptoms? The most common symptoms of  orchitis are swelling and pain in the scrotum. Other signs and symptoms may include:  Feeling generally sick (malaise).  Fever and chills.  Painful urination.  Painful ejaculation.  Headache.  Fatigue.  Nausea.  Blood or discharge from the penis.  Swollen lymph nodes in the groin area (inguinal nodes). How is this diagnosed? This condition may be diagnosed based on:  Your symptoms. Your health care provider may suspect orchitis if you have a painful, swollen testicle along with other signs and symptoms of the condition.  A physical exam. You may also have other tests, including:  A blood test to check for signs of infection.  A urine test to check for a urinary tract infection or STI.  Using a swab to collect a fluid sample from the tip of the penis to test for STIs.  Taking an image of the testicle using sound waves and a computer (testicular ultrasound). How is this treated? Treatment for this condition depends on the cause.  For bacterial orchitis, your health care provider may prescribe antibiotic medicines. Bacterial infections usually clear up within a few days. For both viral infections and bacterial infections, you may be treated with:  Rest.  Anti-inflammatory medicines.  Pain medicines.  Raising (elevating) the scrotum with a towel or pillow underneath and applying ice. Follow these instructions at home:  Rest as directed by your health care provider.  Take over-the-counter and prescription medicines only as told by your health care provider.  If you were prescribed an antibiotic medicine, take it as told by your health care provider. Do not stop taking the antibiotic even if you start to feel better.  Do not have sex until your health care provider says it is okay to do so.  Elevate your scrotum and apply ice as directed: ? Put ice in a plastic bag. ? Place a small towel or pillow between your legs. ? Rest your scrotum on the pillow or  towel. ? Place another towel between your skin and the plastic bag. ? Leave the ice on for 20 minutes, 2-3 times a day.  Keep all follow-up visits as told by your health care provider. This is important. Contact a health care provider if:  You have a fever.  Pain and swelling have not gotten better after 3 days. Get help right away if:  Your pain is getting worse.  The swelling in your testicle gets worse. Summary  Orchitis is inflammation of a testicle. It is caused by an infection from bacteria or a virus.  The most common symptoms of orchitis are swelling and pain in the scrotum.  Treatment for this condition depends on the cause. It may include medicines to fight the infection, reduce inflammation, and relieve the pain.  Follow your health care provider's instructions about resting, icing, not having sex, and taking medicines. This information is not intended to replace advice given to you by your health care provider. Make sure you discuss any questions you have with your health care provider. Document Revised: 12/22/2017 Document Reviewed: 12/22/2017 [employer] Patient Education  2021 [employer]  

## 2021-04-29 ENCOUNTER — Telehealth: Payer: Self-pay

## 2021-04-29 NOTE — Telephone Encounter (Signed)
Patient made aware that surgery scheduler will call with surgery date, time, and instructions.

## 2021-04-29 NOTE — Telephone Encounter (Signed)
Left a Voice Message:  Wanting to know what he needs to do prior to his procedure.  Please advise.  Call back:  [phone]   Thanks, Helene Kelp

## 2021-05-04 ENCOUNTER — Encounter: Payer: Self-pay | Admitting: Urology

## 2021-05-17 IMAGING — CT CT PELVIS W/ CM
2 of 3 series · 17 of 46 positions shown, 19 images · IV contrast (APPLIED)
Comparison: None available.

CLINICAL DATA: Right groin and upper thigh pain. Also left groin
pain, prior left hernia repair.

EXAM:
CT PELVIS WITH CONTRAST
TECHNIQUE: Multidetector CT imaging of the pelvis was performed using the
standard protocol following the bolus administration of intravenous
contrast.
CONTRAST:  100mL OMNIPAQUE IOHEXOL 300 MG/ML  SOLN

[Series 2: axial st · axial · 0.98mm/px · z∈[+817,+1172]mm · 14 of 83 slices shown, 16 images]
[im 6/83  soft-tissue]
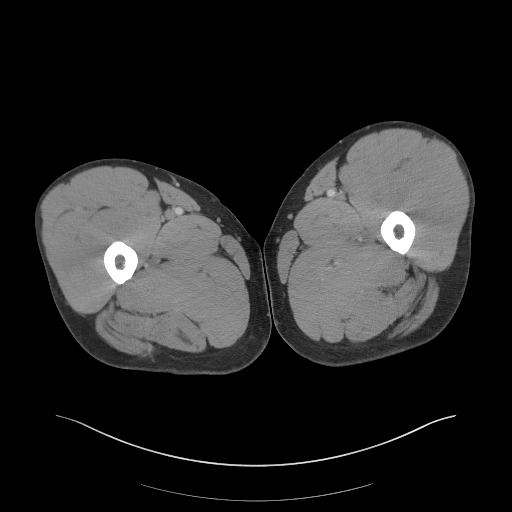
[im 6/83  bone]
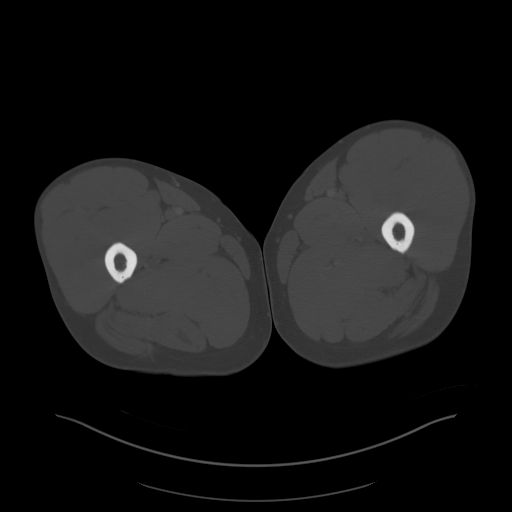
[im 11/83  soft-tissue]
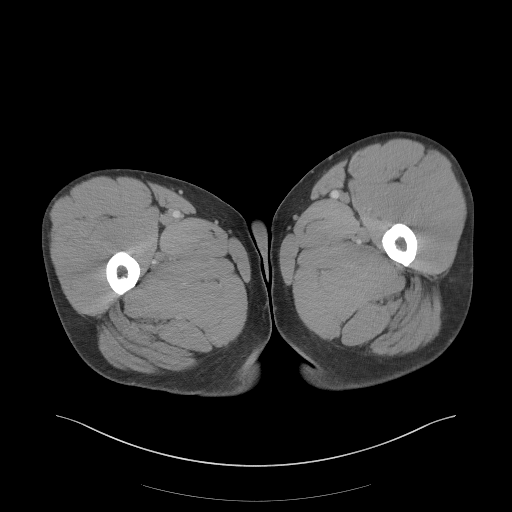
[im 16/83  soft-tissue]
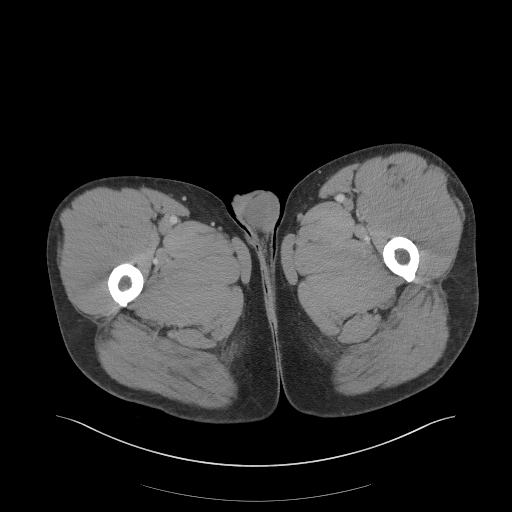
[im 22/83  soft-tissue]
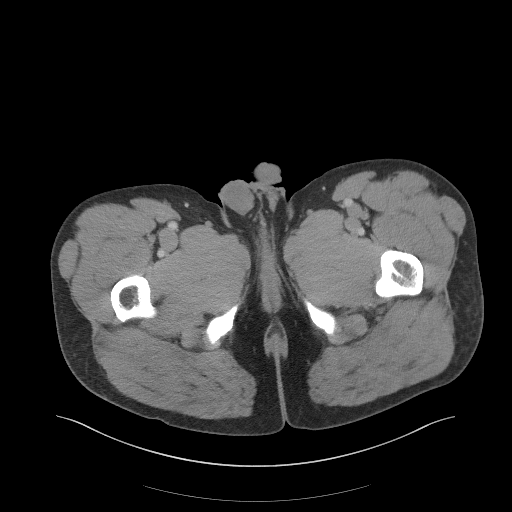
[im 27/83  soft-tissue]
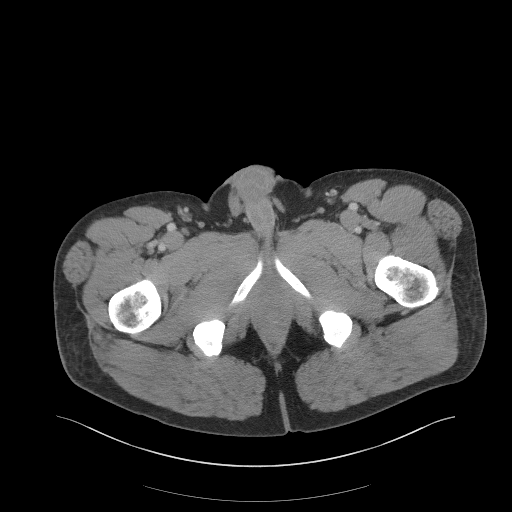
[im 32/83  soft-tissue]
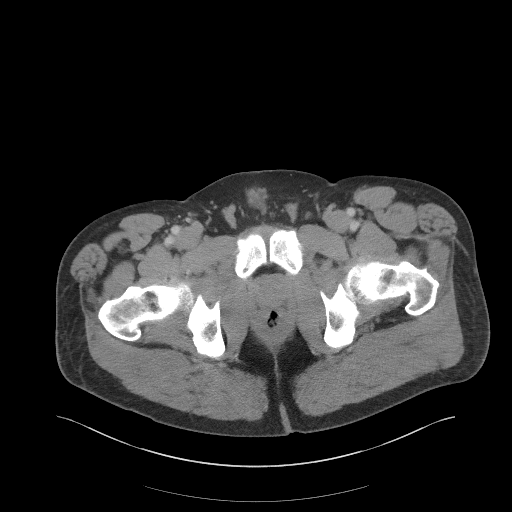
[im 38/83  soft-tissue]
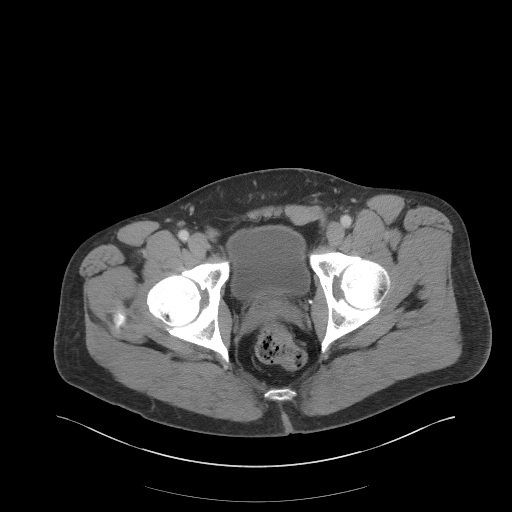
[im 45/83  soft-tissue]
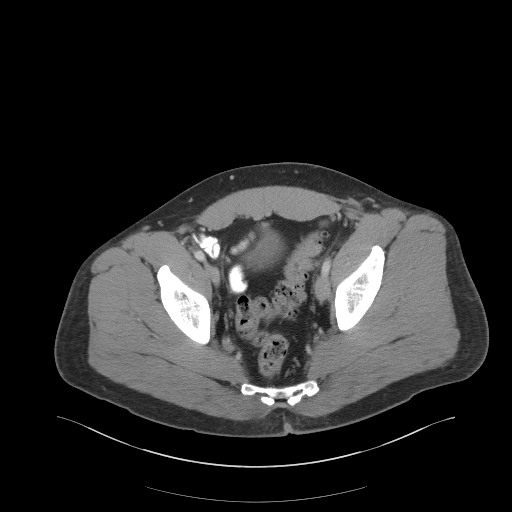
[im 51/83  soft-tissue]
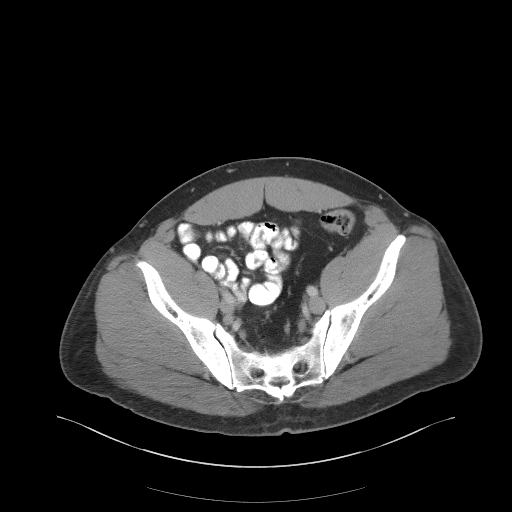
[im 51/83  bone]
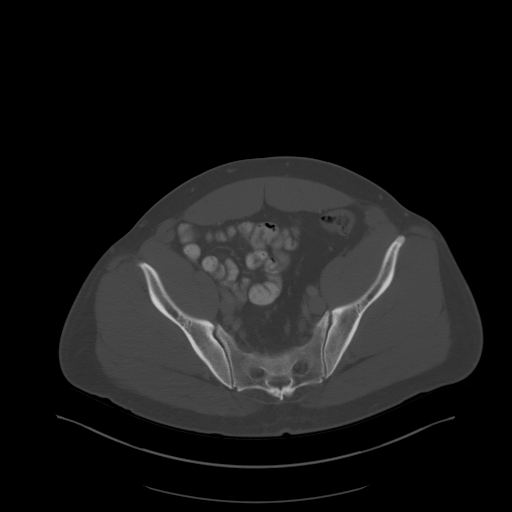
[im 56/83  soft-tissue]
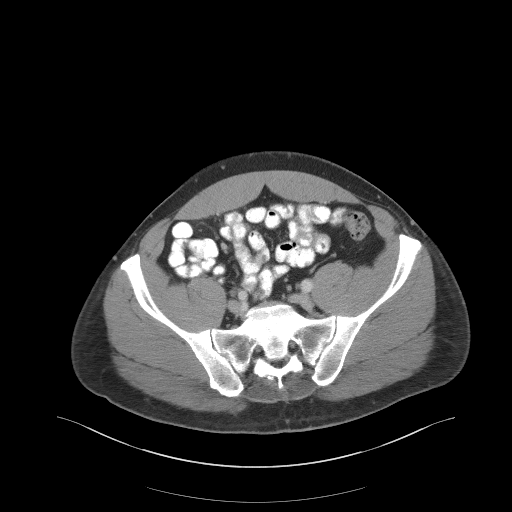
[im 61/83  soft-tissue]
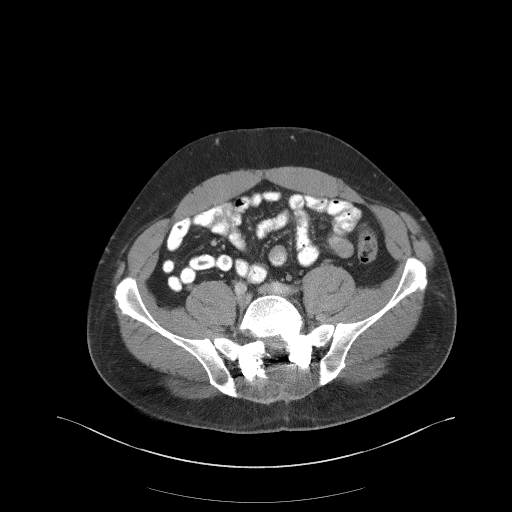
[im 67/83  soft-tissue]
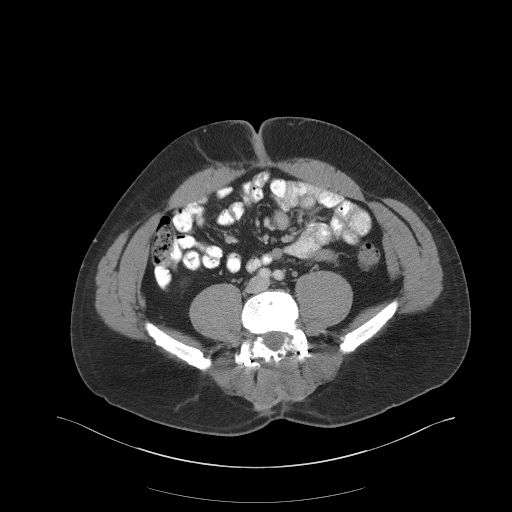
[im 72/83  soft-tissue]
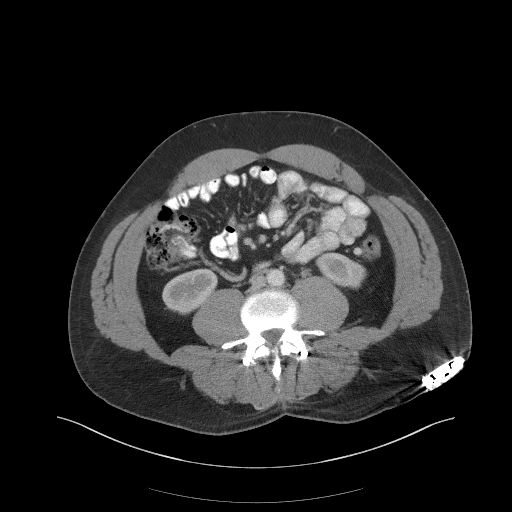
[im 77/83  soft-tissue]
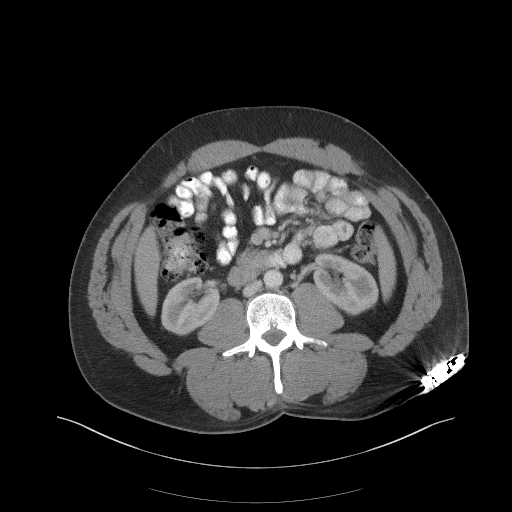

[Series 4: coronal st · coronal · 0.79mm/px · 3 of 112 slices shown]
[im 38/112  soft-tissue]
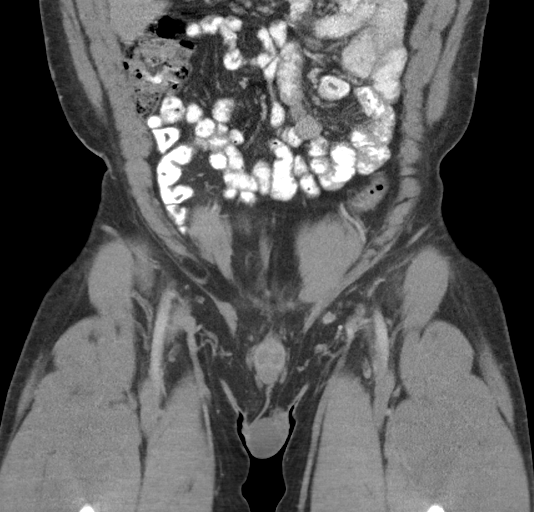
[im 50/112  soft-tissue]
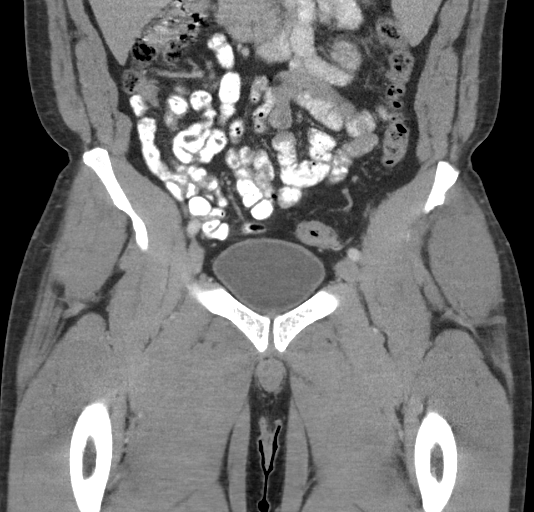
[im 62/112  soft-tissue]
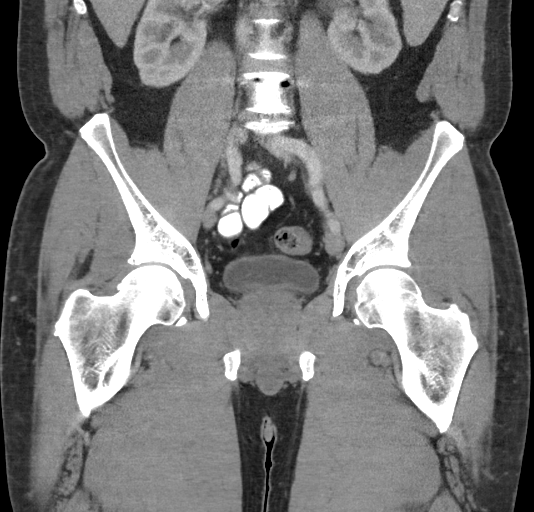

[17 of 46 positions shown; findings below may reference images not displayed]

FINDINGS: Urinary Tract: Probable extrarenal pelvis configuration of left
kidney. Both ureters are decompressed. Urinary bladder is
unremarkable.

Bowel: Descending and sigmoid colonic diverticulosis without
diverticulitis. Normal appendix. Pelvic small bowel loops are
unremarkable. No acute bowel abnormality.

Vascular/Lymphatic: Mild bi-iliac atherosclerosis. There is no
pelvic adenopathy.

Reproductive:  Unremarkable prostate gland.

Other: Small fat containing right inguinal hernia medial to the
inferior epigastric vessels. No stranding, inflammation or bowel
involvement. Prior left inguinal hernia repair without recurrent
hernia. There is no pelvic free fluid.

Musculoskeletal: Left gluteal battery pack from spinal stimulator,
partially included. Posterior lumbar fusion L4-S1. Mild degenerative
change of the right hip. There are no acute or suspicious osseous
abnormalities.
IMPRESSION: 1. Small fat containing direct right inguinal hernia. No bowel
involvement or inflammation.
2. Prior left inguinal hernia repair without recurrent hernia.
3. Colonic diverticulosis without diverticulitis.

Aortic Atherosclerosis ([id number].J).

## 2021-05-26 ENCOUNTER — Ambulatory Visit: Payer: BC Managed Care – PPO | Admitting: Urology

## 2021-06-07 NOTE — Patient Instructions (Signed)
CASHIS RILL  06/07/2021     @PREFPERIOPPHARMACY @   Your procedure is scheduled on  06/10/2021.   Report to [name] at  [place].M.  Call this number if you have problems the morning of surgery:  [phone]   Remember:  Do not eat or drink after midnight.      Take these medicines the morning of surgery with A SIP OF WATER    xanax (if needed), zyrtec, relpax( If needed), prilosec, zofran (if needed).     Please brush your teeth.  Do not wear jewelry, make-up or nail polish.  Do not wear lotions, powders, or perfumes, or deodorant.  Do not shave 48 hours prior to surgery.  Men may shave face and neck.  Do not bring valuables to the hospital.  [facility] is not responsible for any belongings or valuables.  Contacts, dentures or bridgework may not be worn into surgery.  Leave your suitcase in the car.  After surgery it may be brought to your room.  For patients admitted to the hospital, discharge time will be determined by your treatment team.  Patients discharged the day of surgery will not be allowed to drive home and must have someone with them for 24 hours.    Special instructions:   DO NOT smoke tobacco for 24 hours before your procedure.  Please read over the following fact sheets that you were given. Coughing and Deep Breathing, Surgical Site Infection Prevention, Anesthesia Post-op Instructions, and Care and Recovery After Surgery      Varicocelectomy, Care After This sheet gives you information about how to care for yourself after your procedure. Your health care provider may also give you more specific instructions. If you have problems or questions, contact your health careprovider. What can I expect after the procedure? After the procedure, it is common to have: Pain. Swelling. Bruising. A small amount of clear fluid or blood coming from your incision. Follow these instructions at home: Bathing Do not take baths, swim, or use a hot  tub until your health care provider approves. Ask your health care provider if you may take showers. You may only be allowed to take sponge baths. If you were told to wear an athletic support strap, take it off when you shower or take a bath. Incision care  Follow instructions from your health care provider about how to take care of your incision. Make sure you: Wash your hands with soap and water before and after you change your bandage (dressing). If soap and water are not available, use hand sanitizer. Change your dressing as told by your health care provider. Leave stitches (sutures) or adhesive strips in place. These skin closures may need to stay in place for 2 weeks or longer. If adhesive strip edges start to loosen and curl up, you may trim the loose edges. Do not remove adhesive strips completely unless your health care provider tells you to do that. Check your incision area every day for signs of infection. Check for: Redness. Warmth. More pain, swelling, or bruising. More fluid or blood. Pus or a bad smell.  Managing pain, stiffness, and swelling  If directed, raise (elevate) the scrotum on a towel while sitting or lying down. If directed, put ice on the injured area. To do this: Put ice in a plastic bag. Place a towel between your skin and the bag. Leave the ice on for 20 minutes, 2-3 times a day.  Driving Do not drive for 24 hours if you were given a sedative during your procedure. Ask your health care provider if the medicine prescribed to you requires you to avoid driving or using heavy machinery. Ask your health care provider when it is safe to drive. Activity  Rest as told by your health care provider. Avoid sitting for a long time without moving. Get up to take short walks every 1-2 hours. This is important to improve blood flow and breathing. Ask for help if you feel weak or unsteady. Do not have sexual intercourse until your health care provider approves. Avoid  having an erection or ejaculation. Do not lift anything that is heavier than 10 lb (4.5 kg), or the limit that you are told, until your health care provider says that it is safe. Do not do any activities that require a lot of strength and energy. Return to your normal activities as told by your health care provider. Ask your health care provider what activities are safe for you.  General instructions Take over-the-counter and prescription medicines only as told by your health care provider. Ask your health care provider if the medicine prescribed to you can cause constipation. You may need to take these actions to prevent or treat constipation: Drink enough fluid to keep your urine pale yellow. Take over-the-counter or prescription medicines. Eat foods that are high in fiber, such as beans, whole grains, and fresh fruits and vegetables. Limit foods that are high in fat and processed sugars, such as fried or sweet foods. If you were given an athletic support strap, wear it as told by your health care provider. Keep all follow-up visits as told by your health care provider. This is important. Contact a health care provider if: You have more redness, bruising, swelling, or pain around your incision. You have more fluid or blood coming from your incision. Your incision feels warm to the touch. You have pus or a bad smell coming from your incision. You have a fever. Get help right away if: You have severe pain. You have pain or swelling in your legs. You have difficulty breathing. Summary Follow instructions from your health care provider about how to take care of your incision. Check your incision area every day for signs of infection. Return to your normal activities as told by your health care provider. Ask your health care provider what activities are safe for you. If you were given an athletic support strap, wear it as told by your health care provider. Keep all follow-up visits as told  by your health care provider. This information is not intended to replace advice given to you by your health care provider. Make sure you discuss any questions you have with your healthcare provider. Document Revised: 02/22/2019 Document Reviewed: 02/22/2019 Elsevier Patient Education  [place] Anesthesia, Adult, Care After This sheet gives you information about how to care for yourself after your procedure. Your health care provider may also give you more specific instructions. If you have problems or questions, contact your health careprovider. What can I expect after the procedure? After the procedure, the following side effects are common: Pain or discomfort at the IV site. Nausea. Vomiting. Sore throat. Trouble concentrating. Feeling cold or chills. Feeling weak or tired. Sleepiness and fatigue. Soreness and body aches. These side effects can affect parts of the body that were not involved in surgery. Follow these instructions at home: For the time period you were told by your health care provider:  Rest. Do not participate in activities where you could fall or become injured. Do not drive or use machinery. Do not drink alcohol. Do not take sleeping pills or medicines that cause drowsiness. Do not make important decisions or sign legal documents. Do not take care of children on your own.  Eating and drinking Follow any instructions from your health care provider about eating or drinking restrictions. When you feel hungry, start by eating small amounts of foods that are soft and easy to digest (bland), such as toast. Gradually return to your regular diet. Drink enough fluid to keep your urine pale yellow. If you vomit, rehydrate by drinking water, juice, or clear broth. General instructions If you have sleep apnea, surgery and certain medicines can increase your risk for breathing problems. Follow instructions from your health care provider about wearing your  sleep device: Anytime you are sleeping, including during daytime naps. While taking prescription pain medicines, sleeping medicines, or medicines that make you drowsy. Have a responsible adult stay with you for the time you are told. It is important to have someone help care for you until you are awake and alert. Return to your normal activities as told by your health care provider. Ask your health care provider what activities are safe for you. Take over-the-counter and prescription medicines only as told by your health care provider. If you smoke, do not smoke without supervision. Keep all follow-up visits as told by your health care provider. This is important. Contact a health care provider if: You have nausea or vomiting that does not get better with medicine. You cannot eat or drink without vomiting. You have pain that does not get better with medicine. You are unable to pass urine. You develop a skin rash. You have a fever. You have redness around your IV site that gets worse. Get help right away if: You have difficulty breathing. You have chest pain. You have blood in your urine or stool, or you vomit blood. Summary After the procedure, it is common to have a sore throat or nausea. It is also common to feel tired. Have a responsible adult stay with you for the time you are told. It is important to have someone help care for you until you are awake and alert. When you feel hungry, start by eating small amounts of foods that are soft and easy to digest (bland), such as toast. Gradually return to your regular diet. Drink enough fluid to keep your urine pale yellow. Return to your normal activities as told by your health care provider. Ask your health care provider what activities are safe for you. This information is not intended to replace advice given to you by your health care provider. Make sure you discuss any questions you have with your healthcare provider. Document Revised:  08/20/2020 Document Reviewed: 03/19/2020 Elsevier Patient Education  2022 [employer]. How to Use Chlorhexidine for Bathing Chlorhexidine gluconate (CHG) is a germ-killing (antiseptic) solution that is used to clean the skin. It can get rid of the bacteria that normally live on the skin and can keep them away for about 24 hours. To clean your skin with CHG, you may be given: A CHG solution to use in the shower or as part of a sponge bath. A prepackaged cloth that contains CHG. Cleaning your skin with CHG may help lower the risk for infection: While you are staying in the intensive care unit of the hospital. If you have a vascular access, such as a central  line, to provide short-term or long-term access to your veins. If you have a catheter to drain urine from your bladder. If you are on a ventilator. A ventilator is a machine that helps you breathe by moving air in and out of your lungs. After surgery. What are the risks? Risks of using CHG include: A skin reaction. Hearing loss, if CHG gets in your ears. Eye injury, if CHG gets in your eyes and is not rinsed out. The CHG product catching fire. Make sure that you avoid smoking and flames after applying CHG to your skin. Do not use CHG: If you have a chlorhexidine allergy or have previously reacted to chlorhexidine. On babies younger than 42 months of age. How to use CHG solution Use CHG only as told by your health care provider, and follow the instructions on the label. Use the full amount of CHG as directed. Usually, this is one bottle. During a shower Follow these steps when using CHG solution during a shower (unless your health care provider gives you different instructions): Start the shower. Use your normal soap and shampoo to wash your face and hair. Turn off the shower or move out of the shower stream. Pour the CHG onto a clean washcloth. Do not use any type of brush or rough-edged sponge. Starting at your neck, lather your  body down to your toes. Make sure you follow these instructions: If you will be having surgery, pay special attention to the part of your body where you will be having surgery. Scrub this area for at least 1 minute. Do not use CHG on your head or face. If the solution gets into your ears or eyes, rinse them well with water. Avoid your genital area. Avoid any areas of skin that have broken skin, cuts, or scrapes. Scrub your back and under your arms. Make sure to wash skin folds. Let the lather sit on your skin for 1-2 minutes or as long as told by your health care provider. Thoroughly rinse your entire body in the shower. Make sure that all body creases and crevices are rinsed well. Dry off with a clean towel. Do not put any substances on your body afterward--such as powder, lotion, or perfume--unless you are told to do so by your health care provider. Only use lotions that are recommended by the manufacturer. Put on clean clothes or pajamas. If it is the night before your surgery, sleep in clean sheets.  During a sponge bath Follow these steps when using CHG solution during a sponge bath (unless your health care provider gives you different instructions): Use your normal soap and shampoo to wash your face and hair. Pour the CHG onto a clean washcloth. Starting at your neck, lather your body down to your toes. Make sure you follow these instructions: If you will be having surgery, pay special attention to the part of your body where you will be having surgery. Scrub this area for at least 1 minute. Do not use CHG on your head or face. If the solution gets into your ears or eyes, rinse them well with water. Avoid your genital area. Avoid any areas of skin that have broken skin, cuts, or scrapes. Scrub your back and under your arms. Make sure to wash skin folds. Let the lather sit on your skin for 1-2 minutes or as long as told by your health care provider. Using a different clean, wet washcloth,  thoroughly rinse your entire body. Make sure that all body creases and  crevices are rinsed well. Dry off with a clean towel. Do not put any substances on your body afterward--such as powder, lotion, or perfume--unless you are told to do so by your health care provider. Only use lotions that are recommended by the manufacturer. Put on clean clothes or pajamas. If it is the night before your surgery, sleep in clean sheets. How to use CHG prepackaged cloths Only use CHG cloths as told by your health care provider, and follow the instructions on the label. Use the CHG cloth on clean, dry skin. Do not use the CHG cloth on your head or face unless your health care provider tells you to. When washing with the CHG cloth: Avoid your genital area. Avoid any areas of skin that have broken skin, cuts, or scrapes. Before surgery Follow these steps when using a CHG cloth to clean before surgery (unless your health care provider gives you different instructions): Using the CHG cloth, vigorously scrub the part of your body where you will be having surgery. Scrub using a back-and-forth motion for 3 minutes. The area on your body should be completely wet with CHG when you are done scrubbing. Do not rinse. Discard the cloth and let the area air-dry. Do not put any substances on the area afterward, such as powder, lotion, or perfume. Put on clean clothes or pajamas. If it is the night before your surgery, sleep in clean sheets.  For general bathing Follow these steps when using CHG cloths for general bathing (unless your health care provider gives you different instructions). Use a separate CHG cloth for each area of your body. Make sure you wash between any folds of skin and between your fingers and toes. Wash your body in the following order, switching to a new cloth after each step: The front of your neck, shoulders, and chest. Both of your arms, under your arms, and your hands. Your stomach and groin area,  avoiding the genitals. Your right leg and foot. Your left leg and foot. The back of your neck, your back, and your buttocks. Do not rinse. Discard the cloth and let the area air-dry. Do not put any substances on your body afterward--such as powder, lotion, or perfume--unless you are told to do so by your health care provider. Only use lotions that are recommended by the manufacturer. Put on clean clothes or pajamas. Contact a health care provider if: Your skin gets irritated after scrubbing. You have questions about using your solution or cloth. Get help right away if: Your eyes become very red or swollen. Your eyes itch badly. Your skin itches badly and is red or swollen. Your hearing changes. You have trouble seeing. You have swelling or tingling in your mouth or throat. You have trouble breathing. You swallow any chlorhexidine. Summary Chlorhexidine gluconate (CHG) is a germ-killing (antiseptic) solution that is used to clean the skin. Cleaning your skin with CHG may help to lower your risk for infection. You may be given CHG to use for bathing. It may be in a bottle or in a prepackaged cloth to use on your skin. Carefully follow your health care provider's instructions and the instructions on the product label. Do not use CHG if you have a chlorhexidine allergy. Contact your health care provider if your skin gets irritated after scrubbing. This information is not intended to replace advice given to you by your health care provider. Make sure you discuss any questions you have with your healthcare provider. Document Revised: 04/17/2020 Document Reviewed: 05/22/2020  Elsevier Patient Education  2022 Elsevier Inc.  

## 2021-06-09 ENCOUNTER — Other Ambulatory Visit (HOSPITAL_COMMUNITY): Payer: BC Managed Care – PPO | Attending: Urology

## 2021-06-09 ENCOUNTER — Other Ambulatory Visit: Payer: Self-pay

## 2021-06-09 ENCOUNTER — Encounter (HOSPITAL_COMMUNITY): Payer: Self-pay

## 2021-06-09 ENCOUNTER — Encounter (HOSPITAL_COMMUNITY)
Admission: RE | Admit: 2021-06-09 | Discharge: 2021-06-09 | Disposition: A | Discharge status: Home / Self Care | Payer: BC Managed Care – PPO | Source: Ambulatory Visit | Attending: Urology | Admitting: Urology

## 2021-06-10 ENCOUNTER — Ambulatory Visit (HOSPITAL_COMMUNITY): Payer: BC Managed Care – PPO | Admitting: Anesthesiology

## 2021-06-10 ENCOUNTER — Encounter (HOSPITAL_COMMUNITY): Payer: Self-pay | Admitting: Urology

## 2021-06-10 ENCOUNTER — Ambulatory Visit (HOSPITAL_COMMUNITY)
Admission: RE | Admit: 2021-06-10 | Discharge: 2021-06-10 | Disposition: A | Discharge status: Home / Self Care | Payer: BC Managed Care – PPO | Attending: Urology | Admitting: Urology

## 2021-06-10 ENCOUNTER — Encounter (HOSPITAL_COMMUNITY)
Admission: RE | Disposition: A | Discharge status: Home / Self Care | Payer: Self-pay | Source: Home / Self Care | Attending: Urology

## 2021-06-10 DIAGNOSIS — Z79899 Other long term (current) drug therapy: Secondary | ICD-10-CM | POA: Insufficient documentation

## 2021-06-10 DIAGNOSIS — Z886 Allergy status to analgesic agent status: Secondary | ICD-10-CM | POA: Diagnosis not present

## 2021-06-10 DIAGNOSIS — N50811 Right testicular pain: Secondary | ICD-10-CM | POA: Insufficient documentation

## 2021-06-10 DIAGNOSIS — Z885 Allergy status to narcotic agent status: Secondary | ICD-10-CM | POA: Insufficient documentation

## 2021-06-10 DIAGNOSIS — Z91048 Other nonmedicinal substance allergy status: Secondary | ICD-10-CM | POA: Insufficient documentation

## 2021-06-10 DIAGNOSIS — Z9889 Other specified postprocedural states: Secondary | ICD-10-CM | POA: Diagnosis not present

## 2021-06-10 DIAGNOSIS — I861 Scrotal varices: Secondary | ICD-10-CM | POA: Diagnosis not present

## 2021-06-10 HISTORY — PX: VARICOCELECTOMY: SHX1084

## 2021-06-10 SURGERY — EXCISION, VARICOCELE
Anesthesia: General | Laterality: Right

## 2021-06-10 MED ORDER — CHLORHEXIDINE GLUCONATE 0.12 % MT SOLN
15.0000 mL | Freq: Once | OROMUCOSAL | Status: AC
  Administered 2021-06-10: 15 mL via OROMUCOSAL

## 2021-06-10 MED ORDER — HYDROCODONE-ACETAMINOPHEN 5-325 MG PO TABS
1.0000 | ORAL_TABLET | Freq: Once | ORAL | Status: AC
  Administered 2021-06-10: 1 via ORAL

## 2021-06-10 MED ORDER — LIDOCAINE 2% (20 MG/ML) 5 ML SYRINGE
INTRAMUSCULAR | Status: DC | PRN
  Administered 2021-06-10: 100 mg via INTRAVENOUS

## 2021-06-10 MED ORDER — BUPIVACAINE HCL (PF) 0.25 % IJ SOLN
INTRAMUSCULAR | Status: DC | PRN
  Administered 2021-06-10: 10 mL

## 2021-06-10 MED ORDER — HYDROCODONE-ACETAMINOPHEN 5-325 MG PO TABS: 1.0000 | ORAL_TABLET | Freq: Four times a day (QID) | ORAL | 0 refills | Status: DC | PRN

## 2021-06-10 MED ORDER — FENTANYL CITRATE (PF) 100 MCG/2ML IJ SOLN
INTRAMUSCULAR | Status: AC
  Filled 2021-06-10: qty 2

## 2021-06-10 MED ORDER — DIPHENHYDRAMINE HCL 25 MG PO CAPS: 25.0000 mg | ORAL_CAPSULE | Freq: Four times a day (QID) | ORAL | Status: DC | PRN

## 2021-06-10 MED ORDER — ORAL CARE MOUTH RINSE: 15.0000 mL | Freq: Once | OROMUCOSAL | Status: AC

## 2021-06-10 MED ORDER — BUPIVACAINE HCL (PF) 0.25 % IJ SOLN
INTRAMUSCULAR | Status: AC
  Filled 2021-06-10: qty 30

## 2021-06-10 MED ORDER — PROPOFOL 10 MG/ML IV BOLUS
INTRAVENOUS | Status: DC | PRN
  Administered 2021-06-10: 250 mg via INTRAVENOUS

## 2021-06-10 MED ORDER — PROMETHAZINE HCL 25 MG/ML IJ SOLN: 6.2500 mg | INTRAMUSCULAR | Status: DC | PRN

## 2021-06-10 MED ORDER — SODIUM CHLORIDE 0.9 % IR SOLN
Status: DC | PRN
  Administered 2021-06-10: 1000 mL

## 2021-06-10 MED ORDER — DIPHENHYDRAMINE HCL 50 MG/ML IJ SOLN
INTRAMUSCULAR | Status: AC
  Filled 2021-06-10: qty 1

## 2021-06-10 MED ORDER — SODIUM CHLORIDE (PF) 0.9 % IJ SOLN
INTRAMUSCULAR | Status: AC
  Filled 2021-06-10: qty 10

## 2021-06-10 MED ORDER — HYDROCODONE-ACETAMINOPHEN 5-325 MG PO TABS
ORAL_TABLET | ORAL | Status: AC
  Filled 2021-06-10: qty 1

## 2021-06-10 MED ORDER — HYDROMORPHONE HCL 1 MG/ML IJ SOLN
0.2500 mg | INTRAMUSCULAR | Status: DC | PRN
  Administered 2021-06-10 (×4): 0.5 mg via INTRAVENOUS
  Filled 2021-06-10 (×4): qty 0.5

## 2021-06-10 MED ORDER — SCOPOLAMINE 1 MG/3DAYS TD PT72
1.0000 | MEDICATED_PATCH | Freq: Once | TRANSDERMAL | Status: DC
  Administered 2021-06-10: 1.5 mg via TRANSDERMAL

## 2021-06-10 MED ORDER — LIDOCAINE HCL (PF) 2 % IJ SOLN
INTRAMUSCULAR | Status: AC
  Filled 2021-06-10: qty 5

## 2021-06-10 MED ORDER — MEPERIDINE HCL 50 MG/ML IJ SOLN: 6.2500 mg | INTRAMUSCULAR | Status: DC | PRN

## 2021-06-10 MED ORDER — HEMOSTATIC AGENTS (NO CHARGE) OPTIME
TOPICAL | Status: DC | PRN
  Administered 2021-06-10: 1 via TOPICAL

## 2021-06-10 MED ORDER — DEXAMETHASONE SODIUM PHOSPHATE 4 MG/ML IJ SOLN
INTRAMUSCULAR | Status: DC | PRN
  Administered 2021-06-10: 10 mg via INTRAVENOUS

## 2021-06-10 MED ORDER — DIPHENHYDRAMINE HCL 50 MG/ML IJ SOLN: 25.0000 mg | Freq: Once | INTRAMUSCULAR | Status: DC

## 2021-06-10 MED ORDER — ONDANSETRON HCL 4 MG/2ML IJ SOLN
INTRAMUSCULAR | Status: AC
  Filled 2021-06-10: qty 2

## 2021-06-10 MED ORDER — ONDANSETRON HCL 4 MG/2ML IJ SOLN
INTRAMUSCULAR | Status: DC | PRN
  Administered 2021-06-10: 4 mg via INTRAVENOUS

## 2021-06-10 MED ORDER — MIDAZOLAM HCL 5 MG/5ML IJ SOLN
INTRAMUSCULAR | Status: DC | PRN
  Administered 2021-06-10: 2 mg via INTRAVENOUS

## 2021-06-10 MED ORDER — LACTATED RINGERS IV SOLN: INTRAVENOUS | Status: DC

## 2021-06-10 MED ORDER — MIDAZOLAM HCL 2 MG/2ML IJ SOLN
INTRAMUSCULAR | Status: AC
  Filled 2021-06-10: qty 2

## 2021-06-10 MED ORDER — CEFAZOLIN SODIUM-DEXTROSE 2-4 GM/100ML-% IV SOLN
2.0000 g | INTRAVENOUS | Status: AC
  Administered 2021-06-10: 2 g via INTRAVENOUS

## 2021-06-10 MED ORDER — SEVOFLURANE IN SOLN
RESPIRATORY_TRACT | Status: AC
  Filled 2021-06-10: qty 250

## 2021-06-10 MED ORDER — CEFAZOLIN SODIUM-DEXTROSE 2-4 GM/100ML-% IV SOLN
INTRAVENOUS | Status: AC
  Filled 2021-06-10: qty 100

## 2021-06-10 MED ORDER — DEXAMETHASONE SODIUM PHOSPHATE 10 MG/ML IJ SOLN
INTRAMUSCULAR | Status: AC
  Filled 2021-06-10: qty 1

## 2021-06-10 MED ORDER — SCOPOLAMINE 1 MG/3DAYS TD PT72
MEDICATED_PATCH | TRANSDERMAL | Status: AC
  Filled 2021-06-10: qty 1

## 2021-06-10 MED ORDER — FENTANYL CITRATE (PF) 100 MCG/2ML IJ SOLN
INTRAMUSCULAR | Status: DC | PRN
  Administered 2021-06-10: 100 ug via INTRAVENOUS
  Administered 2021-06-10 (×2): 25 ug via INTRAVENOUS
  Administered 2021-06-10: 50 ug via INTRAVENOUS

## 2021-06-10 MED ORDER — PROPOFOL 10 MG/ML IV BOLUS
INTRAVENOUS | Status: AC
  Filled 2021-06-10: qty 20

## 2021-06-10 SURGICAL SUPPLY — 29 items
ADH SKN CLS APL DERMABOND .7 (GAUZE/BANDAGES/DRESSINGS) ×1
COVER LIGHT HANDLE STERIS (MISCELLANEOUS) ×4 IMPLANT
COVER WAND RF STERILE (DRAPES) ×2 IMPLANT
DECANTER SPIKE VIAL GLASS SM (MISCELLANEOUS) ×2 IMPLANT
DERMABOND ADVANCED (GAUZE/BANDAGES/DRESSINGS) ×1
DERMABOND ADVANCED .7 DNX12 (GAUZE/BANDAGES/DRESSINGS) ×1 IMPLANT
DRAIN PENROSE 12X.25 LTX STRL (MISCELLANEOUS) ×2 IMPLANT
ELECT NEEDLE TIP 2.8 STRL (NEEDLE) ×2 IMPLANT
ELECT REM PT RETURN 9FT ADLT (ELECTROSURGICAL) ×2
ELECTRODE REM PT RTRN 9FT ADLT (ELECTROSURGICAL) ×1 IMPLANT
GLOVE BIO SURGEON STRL SZ8 (GLOVE) ×2 IMPLANT
GLOVE SURG UNDER POLY LF SZ7 (GLOVE) ×4 IMPLANT
GOWN STRL REUS W/TWL LRG LVL3 (GOWN DISPOSABLE) ×4 IMPLANT
HEMOSTAT ARISTA ABSORB 1G (HEMOSTASIS) ×2 IMPLANT
INST SET MINOR GENERAL (KITS) ×2 IMPLANT
KIT TURNOVER KIT A (KITS) ×2 IMPLANT
LOOP VESSEL MAXI BLUE (MISCELLANEOUS) ×2 IMPLANT
MANIFOLD NEPTUNE II (INSTRUMENTS) ×2 IMPLANT
NEEDLE HYPO 25X1 1.5 SAFETY (NEEDLE) ×2 IMPLANT
NS IRRIG 500ML POUR BTL (IV SOLUTION) IMPLANT
PACK MINOR (CUSTOM PROCEDURE TRAY) ×2 IMPLANT
PAD ARMBOARD 7.5X6 YLW CONV (MISCELLANEOUS) ×2 IMPLANT
PENCIL SMOKE EVACUATOR (MISCELLANEOUS) ×2 IMPLANT
SET BASIN LINEN APH (SET/KITS/TRAYS/PACK) ×2 IMPLANT
SOL PREP PROV IODINE SCRUB 4OZ (MISCELLANEOUS) ×2 IMPLANT
SUPPORT SCROTAL XLRG NO STRP (MISCELLANEOUS) ×2 IMPLANT
SUT MNCRL AB 4-0 PS2 18 (SUTURE) ×2 IMPLANT
SUT VIC AB 2-0 CT2 27 (SUTURE) ×4 IMPLANT
SYR CONTROL 10ML LL (SYRINGE) ×2 IMPLANT

## 2021-06-10 NOTE — Anesthesia Postprocedure Evaluation (Signed)
Anesthesia Post Note  Patient: Chase Wyatt  Procedure(s) Performed: right, neurolysis-subinguial (Right)  Patient location during evaluation: PACU Anesthesia Type: General Level of consciousness: awake and alert and oriented Pain management: pain level controlled Respiratory status: spontaneous breathing and respiratory function stable Cardiovascular status: blood pressure returned to baseline and stable Postop Assessment: no apparent nausea or vomiting Anesthetic complications: no   No notable events documented.   Last Vitals:  Vitals:   06/10/21 1300 06/10/21 1315  BP:  136/69  Pulse:  64  Resp: 15 14  Temp:    SpO2: 97% 95%    Last Pain:  Vitals:   06/10/21 1322  TempSrc:   PainSc: 5                  Daviel Allegretto C Ritisha Deitrick

## 2021-06-10 NOTE — Transfer of Care (Signed)
Immediate Anesthesia Transfer of Care Note  Patient: Chase Wyatt  Procedure(s) Performed: right, neurolysis-subinguial (Right)  Patient Location: PACU  Anesthesia Type:General  Level of Consciousness: drowsy  Airway & Oxygen Therapy: Patient Spontanous Breathing and Patient connected to face mask oxygen  Post-op Assessment: Report given to RN and Post -op Vital signs reviewed and stable  Post vital signs: Reviewed and stable  Last Vitals:  Vitals Value Taken Time  BP 130/67 06/10/21 1231  Temp    Pulse 62 06/10/21 1232  Resp 8 06/10/21 1232  SpO2 100 % 06/10/21 1232  Vitals shown include unvalidated device data.  Last Pain:  Vitals:   06/10/21 1008  TempSrc: Oral  PainSc: 4       Patients Stated Pain Goal: 6 (76/22/63 3354)  Complications: No notable events documented.

## 2021-06-10 NOTE — Anesthesia Procedure Notes (Signed)
Procedure Name: LMA Insertion Date/Time: 06/10/2021 11:15 AM Performed by: Tacy Learn, CRNA Pre-anesthesia Checklist: Patient identified, Emergency Drugs available, Suction available, Patient being monitored and Timeout performed Patient Re-evaluated:Patient Re-evaluated prior to induction Oxygen Delivery Method: Circle system utilized Preoxygenation: Pre-oxygenation with 100% oxygen Induction Type: IV induction LMA: LMA inserted LMA Size: 5.0 Placement Confirmation: positive ETCO2, CO2 detector and breath sounds checked- equal and bilateral Tube secured with: Tape Dental Injury: Teeth and Oropharynx as per pre-operative assessment

## 2021-06-10 NOTE — Anesthesia Preprocedure Evaluation (Addendum)
Anesthesia Evaluation  Patient identified by MRN, date of birth, ID band Patient awake    Reviewed: Allergy & Precautions, NPO status , Patient's Chart, lab work & pertinent test results  History of Anesthesia Complications (+) PONV, PROLONGED EMERGENCE and history of anesthetic complications  Airway Mallampati: III  TM Distance: >3 FB Neck ROM: Full    Dental  (+) Dental Advisory Given, Missing   Pulmonary sleep apnea and Continuous Positive Airway Pressure Ventilation ,    Pulmonary exam normal breath sounds clear to auscultation       Cardiovascular Exercise Tolerance: Good hypertension, Pt. on medications + Valvular Problems/Murmurs  Rhythm:Regular Rate:Normal     Neuro/Psych Anxiety  Neuromuscular disease    GI/Hepatic GERD  Medicated and Controlled,(+)     substance abuse  alcohol use,   Endo/Other  negative endocrine ROS  Renal/GU negative Renal ROS     Musculoskeletal  (+) Arthritis  (thoracic laminectomy, spinal cord stimulator), thoracic laminectomy, spinal cord stimulato   Abdominal   Peds  Hematology   Anesthesia Other Findings   Reproductive/Obstetrics                             Anesthesia Physical  Anesthesia Plan  ASA: 3  Anesthesia Plan: General   Post-op Pain Management:    Induction: Intravenous  PONV Risk Score and Plan: 4 or greater and Treatment may vary due to age or medical condition, Ondansetron, Dexamethasone, Midazolam and Scopolamine patch - Pre-op  Airway Management Planned: LMA  Additional Equipment:   Intra-op Plan:   Post-operative Plan: Extubation in OR  Informed Consent: I have reviewed the patients History and Physical, chart, labs and discussed the procedure including the risks, benefits and alternatives for the proposed anesthesia with the patient or authorized representative who has indicated his/her understanding and acceptance.      Dental advisory given  Plan Discussed with: CRNA and Surgeon  Anesthesia Plan Comments:        Anesthesia Quick Evaluation

## 2021-06-10 NOTE — Op Note (Signed)
Preoperative diagnosis: right orchalgia  Postoperative diagnosis: Same  Procedure: Right neurolysis and right varicocelectomy  Attending: [name], MD  Anesthesia: General  History of blood loss: Minimal  Antibiotics: ancef  Drains: none  Specimens: none  Findings: numerous tortuous veins in spermatic cord. Genitofemoral and ilioinguinal branch in spermatic cord identified and ligated. Testicular artery identified and preserved using doppler.  Indications: Patient is a 50 year old male with a history of right testicular pain which worsened after inguinal hernia repair. He underwent spermatic cord block which alleviated the pain.  After discussing treatment options he decided to proceed with right neurolysis   Procedure in detail: Prior to procedure consent was obtained.  Patient was brought to the operating room and a brief timeout was done to ensure correct patient, correct procedure, correct site.  General anesthesia was administered and patient was placed in supine position.  His genitalia and abdomen was then prepped and draped in usual sterile fashion.  A 4 cm incision was made in the subinguinal region.  We dissected down through the subcutaneous tissue to until we reached the spermatic cord.  The spermatic cord was identified and a Penrose drain was placed under the cord.  We then opened the cord with sharp dissection. We proceeded to identify the testicular artery with the doppler. Once the artery was identified and loop was placed around it and it was excluded from the remainder of the spermatic cord. We then proceeded to ligate the veins in the cord with 3-0 silk ties.. Once all the veins were ligated we once again check for flow in the testicular artery and we noted good flow. We then inspected the operative field and no residual bleeding was noted. We identified the ilioinguinal and genitofemoral nerves which were then ligated. We then closed the subcutaneous tissues in 2  layers with 2-0 Vicryl in a running fashion.  We then closed the skin with 4-0 Monocryl in a running fashion.  Skin glue was then placed over the incision. We then placed a scrotal fluff and this then concluded the procedure which was well tolerated by the patient.  Complications: None  Condition: Stable, extubated, transferred to PACU.  Plan: Patient is to be discharged home.  He is to follow up in 2 weeks for wound check.

## 2021-06-10 NOTE — H&P (Signed)
Urology Admission H&P  Chief Complaint: right inguinal pain  History of Present Illness: [name] is a 50yo with right inguinal pain which worsened after right inguinal hernia repair. He underwent spermatic cord block which improved his pain. 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   BACK SURGERY     multiple   CARDIAC CATHETERIZATION  2013   [place], no stents   CHOLECYSTECTOMY  10/28/2011   Procedure: LAPAROSCOPIC CHOLECYSTECTOMY;  Surgeon: Jamesetta So;  Location: [facility];  Service: General;  Laterality: N/A;   INGUINAL HERNIA REPAIR Left 08/07/2020   Procedure: HERNIA REPAIR INGUINAL ADULT using MESH MARLEX PLUG MEDIUM;  Surgeon: Aviva Signs, MD;  Location: [facility];  Service: General;  Laterality: Left;   INGUINAL HERNIA REPAIR Right 01/11/2021   Procedure: HERNIA REPAIR INGUINAL ADULT;  Surgeon: Aviva Signs, MD;  Location: [facility];  Service: General;  Laterality: Right;   LAMINECTOMY THORACIC SPINE W/ PLACEMENT SPINAL CORD STIMULATOR     SHOULDER OPEN ROTATOR CUFF REPAIR Left    [place]   UMBILICAL HERNIA REPAIR N/A 08/07/2020   Procedure: HERNIA REPAIR UMBILICAL ADULT;  Surgeon: Aviva Signs, MD;  Location: [facility];  Service: General;  Laterality: N/A;    Home Medications:  Current Facility-Administered Medications  Medication Dose Route Frequency Provider Last Rate Last Admin   ceFAZolin (ANCEF) 2-4 GM/100ML-% IVPB            Allergies:  Allergies  Allergen Reactions   Benzoin Rash    Blisters   Freederm Adhesive Remover [New Skin] Itching    Meloxicam Hives   Oxycontin [Oxycodone] Other (See Comments)    Headache    Family History  Problem Relation Age of Onset   Hypotension Neg Hx    Anesthesia problems Neg Hx    Malignant hyperthermia Neg Hx    Pseudochol deficiency Neg Hx    Social History:  reports that he has never smoked. He has quit using smokeless tobacco. He reports current alcohol use of about 1.0 standard drink of alcohol per week. He reports that he does not use drugs.  Review of Systems  Genitourinary:  Positive for testicular pain.  All other systems reviewed and are negative.  Physical Exam:  Vital signs in last 24 hours: Temp:  [98 F (36.7 C)] 98 F (36.7 C) (06/23 1008) Pulse Rate:  [65] 65 (06/23 1008) Resp:  [17] 17 (06/23 1008) BP: (145)/(86) 145/86 (06/23 1008) SpO2:  [97 %] 97 % (06/23 1008) Weight:  [120.2 kg] 120.2 kg (06/23 1008) Physical Exam Vitals reviewed.  Constitutional:      Appearance: Normal appearance.  HENT:     Head: Normocephalic and atraumatic.     Nose: Nose normal. No congestion.     Mouth/Throat:     Mouth: Mucous membranes are dry.  Eyes:     Extraocular Movements: Extraocular movements intact.     Pupils: Pupils are equal, round, and reactive to light.  Cardiovascular:     Rate and Rhythm: Normal rate and regular rhythm.  Pulmonary:     Effort: Pulmonary effort is normal. No respiratory distress.  Abdominal:     General: Abdomen is flat. There is no distension.  Musculoskeletal:        General: No swelling. Normal range of motion.     Cervical back: Normal range of motion and neck supple.  Skin:    General: Skin is warm and dry.  Neurological:     General: No focal deficit present.     Mental Status: He is alert and oriented to person, place, and time.  Psychiatric:        Mood and Affect: Mood normal.        Behavior: Behavior normal.        Thought Content: Thought content normal.        Judgment: Judgment normal.    Laboratory Data:  No results  found for this or any previous visit (from the past 24 hour(s)). No results found for this or any previous visit (from the past 240 hour(s)). Creatinine: No results for input(s): CREATININE in the last 168 hours. Baseline Creatinine: unknwon  Impression/Assessment:  50yo with right orchalgia  Plan:  The risks/benefits/alternatives to right subinguinal neurolysis was explained to the patient and he understands and wishes to proceed with surgery  Chase Wyatt 06/10/2021, 10:22 AM

## 2021-06-11 ENCOUNTER — Encounter (HOSPITAL_COMMUNITY): Payer: Self-pay | Admitting: Urology

## 2021-06-28 ENCOUNTER — Telehealth: Payer: Self-pay

## 2021-06-30 ENCOUNTER — Ambulatory Visit: Payer: BC Managed Care – PPO | Admitting: Urology

## 2021-07-02 ENCOUNTER — Other Ambulatory Visit: Payer: Self-pay

## 2021-07-02 ENCOUNTER — Ambulatory Visit (INDEPENDENT_AMBULATORY_CARE_PROVIDER_SITE_OTHER): Payer: BC Managed Care – PPO | Admitting: Urology

## 2021-07-02 VITALS — BP 172/75 | HR 79

## 2021-07-02 DIAGNOSIS — R1031 Right lower quadrant pain: Secondary | ICD-10-CM

## 2021-07-02 LAB — URINALYSIS, ROUTINE W REFLEX MICROSCOPIC
Bilirubin, UA: NEGATIVE
Glucose, UA: NEGATIVE
Ketones, UA: NEGATIVE
Leukocytes,UA: NEGATIVE
Nitrite, UA: NEGATIVE
Protein,UA: NEGATIVE
Specific Gravity, UA: 1.01 (ref 1.005–1.030)
Urobilinogen, Ur: 0.2 mg/dL (ref 0.2–1.0)
pH, UA: 6.5 (ref 5.0–7.5)

## 2021-07-02 NOTE — Progress Notes (Signed)
07/02/2021 3:05 PM   Chase Wyatt November 23, 1971 [id number]  Referring provider: [name], MD [place],  [place]  Right testicular pain   HPI: [name] is a 50yo here for followup for right testis pain. Since surgery his abdominal pain has improved but his right testis pain is un improved. 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   BACK SURGERY     multiple   CARDIAC CATHETERIZATION  2013   [place], no stents   CHOLECYSTECTOMY  10/28/2011   Procedure: LAPAROSCOPIC CHOLECYSTECTOMY;  Surgeon: Chase Wyatt;  Location: [facility];  Service: General;  Laterality: N/A;   INGUINAL HERNIA REPAIR Left 08/07/2020   Procedure: HERNIA REPAIR INGUINAL ADULT using MESH MARLEX PLUG MEDIUM;  Surgeon: Chase Signs, MD;  Location: [facility];  Service: General;  Laterality: Left;   INGUINAL HERNIA REPAIR Right 01/11/2021   Procedure: HERNIA REPAIR INGUINAL ADULT;  Surgeon: Chase Signs, MD;  Location: [facility];  Service: General;  Laterality: Right;   LAMINECTOMY THORACIC SPINE W/ PLACEMENT SPINAL CORD STIMULATOR     SHOULDER OPEN ROTATOR CUFF REPAIR Left    [place]   UMBILICAL HERNIA REPAIR N/A 08/07/2020   Procedure: HERNIA REPAIR UMBILICAL ADULT;  Surgeon: Chase Signs, MD;  Location: [facility];  Service: General;  Laterality: N/A;   VARICOCELECTOMY Right 06/10/2021   Procedure: right, neurolysis-subinguial;  Surgeon: Chase Gustin, MD;  Location: [facility];  Service: Urology;  Laterality:  Right;    Home Medications:  Allergies as of 07/02/2021       Reactions   Benzoin Rash   Blisters   Freederm Adhesive Remover [new Skin] Itching   Meloxicam Hives   Oxycontin [oxycodone] Other (See Comments)   Headache        Medication List        Accurate as of July 02, 2021  3:05 PM. If you have any questions, ask your nurse or doctor.          ALPRAZolam 0.5 MG tablet Commonly known as: XANAX Take 0.5 mg by mouth 4 (four) times daily as needed for anxiety.   cetirizine 10 MG tablet Commonly known as: ZYRTEC Take 10 mg by mouth daily.   dexamethasone 4 MG tablet Commonly known as: DECADRON Take 4 mg by mouth every morning.   eletriptan 40 MG tablet Commonly known as: RELPAX Take 40 mg by mouth daily as needed for migraine.   fluticasone 50 MCG/ACT nasal spray Commonly known as: FLONASE Place 1 spray into both nostrils daily.   HYDROcodone-acetaminophen 5-325 MG tablet Commonly known as: Norco Take 1 tablet by mouth every 6 (six) hours as needed for moderate pain.   multivitamin tablet Take 1 tablet by mouth daily.   naproxen 500 MG tablet Commonly known as: NAPROSYN Take 500 mg by mouth daily as needed for migraine.   omeprazole 40 MG capsule  Commonly known as: PRILOSEC Take 40 mg by mouth daily.   ondansetron 4 MG tablet Commonly known as: ZOFRAN Take 4 mg by mouth every 8 (eight) hours as needed (MIgraine).   testosterone cypionate 200 MG/ML injection Commonly known as: DEPOTESTOSTERONE CYPIONATE Inject 600 mg into the muscle every 14 (fourteen) days.   valACYclovir 1000 MG tablet Commonly known as: VALTREX Take 2,000 mg by mouth 2 (two) times daily as needed (fever blisters).        Allergies:  Allergies  Allergen Reactions   Benzoin Rash    Blisters   Freederm Adhesive Remover [New Skin] Itching   Meloxicam Hives   Oxycontin [Oxycodone] Other (See Comments)    Headache    Family History: Family History  Problem Relation  Age of Onset   Hypotension Neg Hx    Anesthesia problems Neg Hx    Malignant hyperthermia Neg Hx    Pseudochol deficiency Neg Hx     Social History:  reports that he has never smoked. He has quit using smokeless tobacco. He reports current alcohol use of about 1.0 standard drink of alcohol per week. He reports that he does not use drugs.  ROS: All other review of systems were reviewed and are negative except what is noted above in HPI  Physical Exam: BP (!) 172/75   Pulse 79   Constitutional:  Alert and oriented, No acute distress. HEENT: [place] AT, moist mucus membranes.  Trachea midline, no masses. Cardiovascular: No clubbing, cyanosis, or edema. Respiratory: Normal respiratory effort, no increased work of breathing. GI: Abdomen is soft, nontender, nondistended, no abdominal masses GU: No CVA tenderness. Circumcised phallus. No masses/lesions on penis, testis, scrotum. Prostate 40g smooth no nodules no induration.  Lymph: No cervical or inguinal lymphadenopathy. Skin: No rashes, bruises or suspicious lesions. Neurologic: Grossly intact, no focal deficits, moving all 4 extremities. Psychiatric: Normal mood and affect.  Laboratory Data: Lab Results  Component Value Date   WBC 6.8 01/07/2021   HGB 15.6 01/07/2021   HCT 45.8 01/07/2021   MCV 87.2 01/07/2021   PLT 145 (L) 01/07/2021    Lab Results  Component Value Date   CREATININE 1.17 01/07/2021    No results found for: PSA  No results found for: TESTOSTERONE  No results found for: HGBA1C  Urinalysis    Component Value Date/Time   APPEARANCEUR Clear 04/19/2021 1404   GLUCOSEU Negative 04/19/2021 1404   BILIRUBINUR Negative 04/19/2021 1404   PROTEINUR Negative 04/19/2021 1404   NITRITE Negative 04/19/2021 1404   LEUKOCYTESUR Negative 04/19/2021 1404    Lab Results  Component Value Date   LABMICR Comment 04/19/2021    Pertinent Imaging:  No results found for this or any previous visit.  No results found for  this or any previous visit.  No results found for this or any previous visit.  No results found for this or any previous visit.  No results found for this or any previous visit.  No results found for this or any previous visit.  No results found for this or any previous visit.  No results found for this or any previous visit.   Assessment & Plan:    1. Right inguinal pain -RTC 2 weeks for cord block - Urinalysis, Routine w reflex microscopic   No follow-ups on file.  [name], MD  [facility] [place]

## 2021-07-12 NOTE — Telephone Encounter (Signed)
See last OV note 

## 2021-07-16 ENCOUNTER — Ambulatory Visit (INDEPENDENT_AMBULATORY_CARE_PROVIDER_SITE_OTHER): Payer: BC Managed Care – PPO | Admitting: Urology

## 2021-07-16 ENCOUNTER — Other Ambulatory Visit: Payer: Self-pay

## 2021-07-16 VITALS — BP 173/111 | HR 76 | Temp 98.0°F

## 2021-07-16 DIAGNOSIS — R1031 Right lower quadrant pain: Secondary | ICD-10-CM

## 2021-07-16 NOTE — Progress Notes (Signed)
Urological Symptom Review  Patient is experiencing the following symptoms: Penile pain (male only)    Review of Systems  Gastrointestinal (upper)  : Negative for upper GI symptoms  Gastrointestinal (lower) : Negative for lower GI symptoms  Constitutional : Negative for symptoms  Skin: Negative for skin symptoms  Eyes: Negative for eye symptoms  Ear/Nose/Throat : Negative for Ear/Nose/Throat symptoms  Hematologic/Lymphatic: Negative for Hematologic/Lymphatic symptoms  Cardiovascular : Negative for cardiovascular symptoms  Respiratory : Negative for respiratory symptoms  Endocrine: Negative for endocrine symptoms  Musculoskeletal: Back pain  Neurological: Negative for neurological symptoms  Psychologic: Negative for psychiatric symptoms

## 2021-07-19 ENCOUNTER — Telehealth: Payer: Self-pay

## 2021-07-19 NOTE — Telephone Encounter (Signed)
Patient wanting to schedule surgery with Dr. Alyson Ingles.  Please advise.  Call back:  [phone]  Thanks, Helene Kelp

## 2021-07-19 NOTE — Telephone Encounter (Signed)
I need posting sheet for surgery

## 2021-07-19 NOTE — Telephone Encounter (Signed)
Need posting sheet

## 2021-07-27 ENCOUNTER — Other Ambulatory Visit: Payer: Self-pay

## 2021-07-27 ENCOUNTER — Telehealth (INDEPENDENT_AMBULATORY_CARE_PROVIDER_SITE_OTHER): Payer: BC Managed Care – PPO | Admitting: Urology

## 2021-07-27 DIAGNOSIS — R1031 Right lower quadrant pain: Secondary | ICD-10-CM

## 2021-07-27 NOTE — Progress Notes (Signed)
07/27/2021 4:06 PM   Chase Wyatt 10/02/1971 [id number]  Referring provider: [name], MD [place],  [place]  Patient location: home Physician location: office I connected with  Chase Wyatt on 07/27/21 by a video enabled telemedicine application and verified that I am speaking with the correct person using two identifiers.   I discussed the limitations of evaluation and management by telemedicine. The patient expressed understanding and agreed to proceed.    Followup testis pain   HPI: Chase Wyatt is a 51yo here for followup for right testis/inguinal pain. He noted relief of his right inguinal pain with the cord block. He is scheduled for surgery 9/12. He continues to have sharp, severe, intermittent nonraditing right inguinal pain which is worse with activity.    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   BACK SURGERY     multiple   CARDIAC CATHETERIZATION  2013   [place], no stents   CHOLECYSTECTOMY  10/28/2011   Procedure: LAPAROSCOPIC CHOLECYSTECTOMY;  Surgeon: Jamesetta So;  Location: [facility];  Service: General;  Laterality: N/A;   INGUINAL HERNIA REPAIR Left 08/07/2020   Procedure: HERNIA REPAIR INGUINAL ADULT using MESH MARLEX PLUG MEDIUM;  Surgeon: Aviva Signs, MD;  Location: [facility];  Service: General;  Laterality: Left;   INGUINAL HERNIA REPAIR Right 01/11/2021   Procedure: HERNIA REPAIR INGUINAL ADULT;  Surgeon: Aviva Signs, MD;  Location: [facility];  Service: General;  Laterality: Right;   LAMINECTOMY  THORACIC SPINE W/ PLACEMENT SPINAL CORD STIMULATOR     SHOULDER OPEN ROTATOR CUFF REPAIR Left    [place]   UMBILICAL HERNIA REPAIR N/A 08/07/2020   Procedure: HERNIA REPAIR UMBILICAL ADULT;  Surgeon: Aviva Signs, MD;  Location: [facility];  Service: General;  Laterality: N/A;   VARICOCELECTOMY Right 06/10/2021   Procedure: right, neurolysis-subinguial;  Surgeon: Cleon Gustin, MD;  Location: [facility];  Service: Urology;  Laterality: Right;    Home Medications:  Allergies as of 07/27/2021       Reactions   Benzoin Rash   Blisters   Freederm Adhesive Remover [new Skin] Itching   Meloxicam Hives   Oxycontin [oxycodone] Other (See Comments)   Headache        Medication List        Accurate as of July 27, 2021  4:06 PM. If you have any questions, ask your nurse or doctor.          ALPRAZolam 0.5 MG tablet Commonly known as: XANAX Take 0.5 mg by mouth 4 (four) times daily as needed for anxiety.   cetirizine 10 MG tablet Commonly known as: ZYRTEC Take 10 mg by mouth daily.   dexamethasone 4 MG tablet Commonly known as: DECADRON Take 4 mg by mouth every morning.   eletriptan 40 MG tablet Commonly known as: RELPAX Take 40 mg by mouth daily as needed for migraine.   fluticasone 50 MCG/ACT nasal spray Commonly known as: FLONASE  Place 1 spray into both nostrils daily.   multivitamin tablet Take 1 tablet by mouth daily.   naproxen 500 MG tablet Commonly known as: NAPROSYN Take 500 mg by mouth daily as needed for migraine.   omeprazole 40 MG capsule Commonly known as: PRILOSEC Take 40 mg by mouth daily.   ondansetron 4 MG tablet Commonly known as: ZOFRAN Take 4 mg by mouth every 8 (eight) hours as needed (MIgraine).   testosterone cypionate 200 MG/ML injection Commonly known as: DEPOTESTOSTERONE CYPIONATE Inject 600 mg into the muscle every 14 (fourteen) days.   valACYclovir 1000 MG tablet Commonly known as: VALTREX Take 2,000 mg by mouth 2 (two) times daily  as needed (fever blisters).        Allergies:  Allergies  Allergen Reactions   Benzoin Rash    Blisters   Freederm Adhesive Remover [New Skin] Itching   Meloxicam Hives   Oxycontin [Oxycodone] Other (See Comments)    Headache    Family History: Family History  Problem Relation Age of Onset   Hypotension Neg Hx    Anesthesia problems Neg Hx    Malignant hyperthermia Neg Hx    Pseudochol deficiency Neg Hx     Social History:  reports that he has never smoked. He has quit using smokeless tobacco. He reports current alcohol use of about 1.0 standard drink of alcohol per week. He reports that he does not use drugs.  ROS: All other review of systems were reviewed and are negative except what is noted above in HPI   Laboratory Data: Lab Results  Component Value Date   WBC 6.8 01/07/2021   HGB 15.6 01/07/2021   HCT 45.8 01/07/2021   MCV 87.2 01/07/2021   PLT 145 (L) 01/07/2021    Lab Results  Component Value Date   CREATININE 1.17 01/07/2021    No results found for: PSA  No results found for: TESTOSTERONE  No results found for: HGBA1C  Urinalysis    Component Value Date/Time   APPEARANCEUR Clear 07/02/2021 1507   GLUCOSEU Negative 07/02/2021 1507   BILIRUBINUR Negative 07/02/2021 1507   PROTEINUR Negative 07/02/2021 1507   NITRITE Negative 07/02/2021 1507   LEUKOCYTESUR Negative 07/02/2021 1507    Lab Results  Component Value Date   LABMICR Comment 07/02/2021    Pertinent Imaging:  No results found for this or any previous visit.  No results found for this or any previous visit.  No results found for this or any previous visit.  No results found for this or any previous visit.  No results found for this or any previous visit.  No results found for this or any previous visit.  No results found for this or any previous visit.  No results found for this or any previous visit.   Assessment & Plan:    1. Right inguinal pain -We will  proceed with neurolysis 9/12. Risks/benefits/alternatives discussed   No follow-ups on file.  [name], MD  [facility] [place]

## 2021-07-29 ENCOUNTER — Encounter: Payer: Self-pay | Admitting: Urology

## 2021-07-29 NOTE — Progress Notes (Signed)
07/16/2021 9:08 AM   Chase Wyatt Aug 24, 1971 [id number]  Referring provider: [name], MD [place],  [place]  Right inguinal pain   HPI: Chase Wyatt is a 50yo here for followup for right inguinal pain. His pain has not improved since last visit. 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   BACK SURGERY     multiple   CARDIAC CATHETERIZATION  2013   [place], no stents   CHOLECYSTECTOMY  10/28/2011   Procedure: LAPAROSCOPIC CHOLECYSTECTOMY;  Surgeon: Jamesetta So;  Location: [facility];  Service: General;  Laterality: N/A;   INGUINAL HERNIA REPAIR Left 08/07/2020   Procedure: HERNIA REPAIR INGUINAL ADULT using MESH MARLEX PLUG MEDIUM;  Surgeon: Aviva Signs, MD;  Location: [facility];  Service: General;  Laterality: Left;   INGUINAL HERNIA REPAIR Right 01/11/2021   Procedure: HERNIA REPAIR INGUINAL ADULT;  Surgeon: Aviva Signs, MD;  Location: [facility];  Service: General;  Laterality: Right;   LAMINECTOMY THORACIC SPINE W/ PLACEMENT SPINAL CORD STIMULATOR     SHOULDER OPEN ROTATOR CUFF REPAIR Left    [place]   UMBILICAL HERNIA REPAIR N/A 08/07/2020   Procedure: HERNIA REPAIR UMBILICAL ADULT;  Surgeon: Aviva Signs, MD;  Location: [facility];  Service: General;  Laterality: N/A;   VARICOCELECTOMY Right 06/10/2021   Procedure: right, neurolysis-subinguial;  Surgeon: Cleon Gustin, MD;  Location: [facility];  Service: Urology;  Laterality: Right;    Home  Medications:  Allergies as of 07/16/2021       Reactions   Benzoin Rash   Blisters   Freederm Adhesive Remover [new Skin] Itching   Meloxicam Hives   Oxycontin [oxycodone] Other (See Comments)   Headache        Medication List        Accurate as of July 16, 2021 11:59 PM. If you have any questions, ask your nurse or doctor.          STOP taking these medications    HYDROcodone-acetaminophen 5-325 MG tablet Commonly known as: Norco Stopped by: [name], MD       TAKE these medications    ALPRAZolam 0.5 MG tablet Commonly known as: XANAX Take 0.5 mg by mouth 4 (four) times daily as needed for anxiety.   cetirizine 10 MG tablet Commonly known as: ZYRTEC Take 10 mg by mouth daily.   dexamethasone 4 MG tablet Commonly known as: DECADRON Take 4 mg by mouth every morning.   eletriptan 40 MG tablet Commonly known as: RELPAX Take 40 mg by mouth daily as needed for migraine.   fluticasone 50 MCG/ACT nasal spray Commonly known as: FLONASE Place 1 spray into both nostrils daily.   multivitamin tablet Take 1 tablet by mouth daily.   naproxen 500 MG tablet Commonly known as: NAPROSYN Take 500 mg by mouth daily as needed for migraine.   omeprazole  40 MG capsule Commonly known as: PRILOSEC Take 40 mg by mouth daily.   ondansetron 4 MG tablet Commonly known as: ZOFRAN Take 4 mg by mouth every 8 (eight) hours as needed (MIgraine).   testosterone cypionate 200 MG/ML injection Commonly known as: DEPOTESTOSTERONE CYPIONATE Inject 600 mg into the muscle every 14 (fourteen) days.   valACYclovir 1000 MG tablet Commonly known as: VALTREX Take 2,000 mg by mouth 2 (two) times daily as needed (fever blisters).        Allergies:  Allergies  Allergen Reactions   Benzoin Rash    Blisters   Freederm Adhesive Remover [New Skin] Itching   Meloxicam Hives   Oxycontin [Oxycodone] Other (See Comments)    Headache    Family History: Family History   Problem Relation Age of Onset   Hypotension Neg Hx    Anesthesia problems Neg Hx    Malignant hyperthermia Neg Hx    Pseudochol deficiency Neg Hx     Social History:  reports that he has never smoked. He has quit using smokeless tobacco. He reports current alcohol use of about 1.0 standard drink per week. He reports that he does not use drugs.  ROS: All other review of systems were reviewed and are negative except what is noted above in HPI  Physical Exam: BP (!) 173/111   Pulse 76   Temp 98 F (36.7 C)   Constitutional:  Alert and oriented, No acute distress. HEENT: [place] AT, moist mucus membranes.  Trachea midline, no masses. Cardiovascular: No clubbing, cyanosis, or edema. Respiratory: Normal respiratory effort, no increased work of breathing. GI: Abdomen is soft, nontender, nondistended, no abdominal masses GU: No CVA tenderness.  Lymph: No cervical or inguinal lymphadenopathy. Skin: No rashes, bruises or suspicious lesions. Neurologic: Grossly intact, no focal deficits, moving all 4 extremities. Psychiatric: Normal mood and affect.  Laboratory Data: Lab Results  Component Value Date   WBC 6.8 01/07/2021   HGB 15.6 01/07/2021   HCT 45.8 01/07/2021   MCV 87.2 01/07/2021   PLT 145 (L) 01/07/2021    Lab Results  Component Value Date   CREATININE 1.17 01/07/2021    No results found for: PSA  No results found for: TESTOSTERONE  No results found for: HGBA1C  Urinalysis    Component Value Date/Time   APPEARANCEUR Clear 07/02/2021 1507   GLUCOSEU Negative 07/02/2021 1507   BILIRUBINUR Negative 07/02/2021 1507   PROTEINUR Negative 07/02/2021 1507   NITRITE Negative 07/02/2021 1507   LEUKOCYTESUR Negative 07/02/2021 1507    Lab Results  Component Value Date   LABMICR Comment 07/02/2021    Pertinent Imaging:  No results found for this or any previous visit.  No results found for this or any previous visit.  No results found for this or any previous  visit.  No results found for this or any previous visit.  No results found for this or any previous visit.  No results found for this or any previous visit.  No results found for this or any previous visit.  No results found for this or any previous visit.  Cord block procedure   The patients right inguinal region was prepped with betadine. We then proceeded to inject 34m of a half 0.5% bupivicaine and half 2% lidocaine on the floor of the right spermatic cord and then an additional 571mof the same solution at the top of the cord. The patient tolerated the procedure well   Assessment & Plan:    1. Right inguinal  pain Cord block performed today. Patient will call with his response to therapy.   No follow-ups on file.  [name], MD  [facility] [place]

## 2021-08-15 ENCOUNTER — Encounter: Payer: Self-pay | Admitting: Urology

## 2021-08-15 NOTE — Patient Instructions (Signed)
Varicocele  A varicocele is a swelling of veins in the scrotum. The scrotum is the sac that contains the testicles. Varicoceles can occur on either side of the scrotum, but they are more common on the left side. They occur most often in teenageboys and young men. In most cases, varicoceles are not a serious problem. They are usually small and painless and do not require treatment. Tests may be done to confirm the diagnosis. Treatment may be needed if: A varicocele is large, causes a lot of pain, or causes pain when exercising. Varicoceles are found on both sides of the scrotum. A varicocele causes a decrease in the size of the testicle in a growing adolescent. The person has fertility problems. What are the causes? This condition is the result of valves in the veins not working properly. Valves in the veins help to return blood from the scrotum and testicles to the heart. If these valves do not work well, blood flows backward and backs up into the veins, which causes the veins to swell. This is similar to what happenswhen varicose veins form in the leg. What are the signs or symptoms? Most varicoceles do not cause any symptoms. If symptoms do occur, they may include: Swelling on one side of the scrotum. The swelling may be more obvious when you are standing up. A lumpy feeling in the scrotum. A heavy feeling on one side of the scrotum. A dull ache in the scrotum, especially after exercise or prolonged standing or sitting. Slower growth or reduced size of the testicle on the side of the varicocele (in young males). Problems with fathering a child (fertility). This can occur if the testicle does not grow normally or if the condition causes problems with the sperm, such as a low sperm count or sperm that are not able to reach the egg (poor motility). How is this diagnosed? This condition is diagnosed based on: Your medical history. A physical exam. Your health care provider may inspect and feel  (palpate) the scrotal area to check for swollen or enlarged veins. An ultrasound. This may be done to confirm the diagnosis and to help rule out other causes of the swelling. How is this treated? Treatment is usually not needed for this condition. If you have any pain, your health care provider may prescribe or recommend medicine to help relieve it. You may need regular exams so your health care provider can monitor thevaricocele to ensure that it does not cause problems. When further treatment is needed, it may involve one of these options: Varicocelectomy. This is a surgery in which the swollen veins are tied off so that the flow of blood goes to other veins instead. Embolization. In this procedure, a small, thin tube (catheter) is used to place metal coils or other blocking items in the veins. This cuts off the blood flow to the swollen veins. Follow these instructions at home: Take over-the-counter and prescription medicines only as told by your health care provider. Wear supportive underwear. Use an athletic supporter when participating in sports activities. Keep all follow-up visits as told by your health care provider. This is important. Contact a health care provider if: Your pain is increasing. You have redness in the affected area. Your testicle becomes enlarged, swollen, or painful. You have swelling that does not decrease when you are lying down. One of your testicles is smaller than the other. Get help right away if: You develop swelling in your legs. You have difficulty breathing. Summary Varicocele is  a condition in which the veins in the scrotum are swollen or enlarged. In most cases, varicoceles do not require treatment. Treatment may be needed if you have pain, have problems with infertility, or have a smaller testicle associated with the varicocele. In some cases, the condition may be treated with a procedure to cut off the flow of blood to the swollen veins. This  information is not intended to replace advice given to you by your health care provider. Make sure you discuss any questions you have with your healthcare provider. Document Revised: 02/22/2019 Document Reviewed: 02/22/2019 Elsevier Patient Education  [place].

## 2021-08-27 NOTE — Patient Instructions (Signed)
Chase Wyatt  08/27/2021     '@PREFPERIOPPHARMACY'$ @   Your procedure is scheduled on  09/02/2021.   Report to [name] at  0730 A.M.   Call this number if you have problems the morning of surgery:  [phone]   Remember:  Do not eat or drink after midnight.      Take these medicines the morning of surgery with A SIP OF WATER       xanax(if needed), zyrtec, decadron, relpax, prilosec, zofran (if needed).     Do not wear jewelry, make-up or nail polish.  Do not wear lotions, powders, or perfumes, or deodorant.  Do not shave 48 hours prior to surgery.  Men may shave face and neck.  Do not bring valuables to the hospital.  [facility] is not responsible for any belongings or valuables.  Contacts, dentures or bridgework may not be worn into surgery.  Leave your suitcase in the car.  After surgery it may be brought to your room.  For patients admitted to the hospital, discharge time will be determined by your treatment team.  Patients discharged the day of surgery will not be allowed to drive home and must have someone with them for 24 hours.     Special instructions:   DO NOT smoke tobacco or vape for 24 hours before your procedure.  Please read over the following fact sheets that you were given. Coughing and Deep Breathing, Surgical Site Infection Prevention, Anesthesia Post-op Instructions, and Care and Recovery After Surgery      Vasectomy, Care After This sheet gives you information about how to care for yourself after your procedure. Your health care provider may also give you more specific instructions. If you have problems or questions, contact your health care provider. What can I expect after the procedure? After the procedure, it is common to have: Mild pain, swelling, or discomfort in your scrotum or redness on your scrotum. Some blood coming from your incisions or puncture sites for 1 or 2 days. Blood in your semen. Follow these instructions  at home: Medicines Take over-the-counter and prescription medicines only as told by your health care provider. Avoid taking any medicines that contain aspirin or NSAIDs, such as ibuprofen. These medicines can make bleeding worse. Activity For the first 2 days after surgery, avoid physical activity and exercise that requires a lot of energy. Ask your health care provider what activities are safe for you. Do not take part in sports or perform heavy physical labor until your pain has improved, or until your health care provider says it is okay. You may have limits on the amount of weight you can lift as told by your health care provider. Do not ejaculate for at least 1 week after the procedure, or for as long as you are told. You may resume sexual activity 7-10 days after your procedure, or when your health care provider approves. Use a different method of birth control (contraception) until you have had test results that confirm that there is no sperm in your semen. Scrotal support Use scrotal support, such as a jockstrap or underwear with a supportive pouch, as needed for 1 week after your procedure. If you feel discomfort in your scrotum, you may remove the scrotal support to see if the discomfort is relieved. Sometimes scrotal support can press on the scrotum and cause or worsen discomfort. If your skin gets irritated, you may add some germ-free (sterile), fluffed bandages  or a clean washcloth to the scrotal support. Managing pain and swelling If directed, put ice on the affected area. To do this: Put ice in a plastic bag. Place a towel between your skin and the bag. Leave the ice on for 20 minutes, 2-3 times a day. Remove the ice if your skin turns bright red. This is very important. If you cannot feel pain, heat, or cold, you have a greater risk of damage to the area.  General instructions  Check your incisions or puncture sites every day for signs of infection. Check for: Redness,  swelling, or more pain. Fluid or blood. Warmth. Pus or a bad smell. Leave stitches (sutures) in place. The sutures will dissolve on their own and do not need to be removed. Keep all follow-up visits. This is important because you will need a test to confirm that there is no sperm in your semen. Multiple ejaculations are needed to clear out sperm that were beyond the vasectomy site. You will need one test result showing that there is no sperm in your semen before you can resume unprotected sex. This may take 2-4 months after your procedure. If you were given a sedative during the procedure, it can affect you for several hours. Do not drive or operate machinery until your health care provider says that it is safe. Contact a health care provider if: You have redness, swelling, or more pain around an incision or puncture site, or in your scrotum area. You have bleeding from an incision or puncture site. You have pus or a bad smell coming from an incision or puncture site. You have a fever. An incision or puncture site opens up. Get help right away if: You develop a rash. You have trouble breathing. Summary After your procedure, it is common to have mild pain, swelling, redness, or discomfort in your scrotum. For the first 2 days after surgery, avoid physical activity and exercise that requires a lot of energy. Put ice on the affected area. Leave the ice on for 20 minutes, 2-3 times a day. If you were given a sedative during the procedure, it can affect you for several hours. Do not drive or operate machinery until your health care provider says that it is safe. This information is not intended to replace advice given to you by your health care provider. Make sure you discuss any questions you have with your health care provider. Document Revised: 04/23/2020 Document Reviewed: 04/23/2020 Elsevier Patient Education  [place] Anesthesia, Adult, Care After This sheet gives you  information about how to care for yourself after your procedure. Your health care provider may also give you more specific instructions. If you have problems or questions, contact your health care provider. What can I expect after the procedure? After the procedure, the following side effects are common: Pain or discomfort at the IV site. Nausea. Vomiting. Sore throat. Trouble concentrating. Feeling cold or chills. Feeling weak or tired. Sleepiness and fatigue. Soreness and body aches. These side effects can affect parts of the body that were not involved in surgery. Follow these instructions at home: For the time period you were told by your health care provider:  Rest. Do not participate in activities where you could fall or become injured. Do not drive or use machinery. Do not drink alcohol. Do not take sleeping pills or medicines that cause drowsiness. Do not make important decisions or sign legal documents. Do not take care of children on your own. Eating and  drinking Follow any instructions from your health care provider about eating or drinking restrictions. When you feel hungry, start by eating small amounts of foods that are soft and easy to digest (bland), such as toast. Gradually return to your regular diet. Drink enough fluid to keep your urine pale yellow. If you vomit, rehydrate by drinking water, juice, or clear broth. General instructions If you have sleep apnea, surgery and certain medicines can increase your risk for breathing problems. Follow instructions from your health care provider about wearing your sleep device: Anytime you are sleeping, including during daytime naps. While taking prescription pain medicines, sleeping medicines, or medicines that make you drowsy. Have a responsible adult stay with you for the time you are told. It is important to have someone help care for you until you are awake and alert. Return to your normal activities as told by your  health care provider. Ask your health care provider what activities are safe for you. Take over-the-counter and prescription medicines only as told by your health care provider. If you smoke, do not smoke without supervision. Keep all follow-up visits as told by your health care provider. This is important. Contact a health care provider if: You have nausea or vomiting that does not get better with medicine. You cannot eat or drink without vomiting. You have pain that does not get better with medicine. You are unable to pass urine. You develop a skin rash. You have a fever. You have redness around your IV site that gets worse. Get help right away if: You have difficulty breathing. You have chest pain. You have blood in your urine or stool, or you vomit blood. Summary After the procedure, it is common to have a sore throat or nausea. It is also common to feel tired. Have a responsible adult stay with you for the time you are told. It is important to have someone help care for you until you are awake and alert. When you feel hungry, start by eating small amounts of foods that are soft and easy to digest (bland), such as toast. Gradually return to your regular diet. Drink enough fluid to keep your urine pale yellow. Return to your normal activities as told by your health care provider. Ask your health care provider what activities are safe for you. This information is not intended to replace advice given to you by your health care provider. Make sure you discuss any questions you have with your health care provider. Document Revised: 08/20/2020 Document Reviewed: 03/19/2020 Elsevier Patient Education  2022 [place]. How to Use Chlorhexidine for Bathing Chlorhexidine gluconate (CHG) is a germ-killing (antiseptic) solution that is used to clean the skin. It can get rid of the bacteria that normally live on the skin and can keep them away for about 24 hours. To clean your skin with CHG, you  may be given: A CHG solution to use in the shower or as part of a sponge bath. A prepackaged cloth that contains CHG. Cleaning your skin with CHG may help lower the risk for infection: While you are staying in the intensive care unit of the hospital. If you have a vascular access, such as a central line, to provide short-term or long-term access to your veins. If you have a catheter to drain urine from your bladder. If you are on a ventilator. A ventilator is a machine that helps you breathe by moving air in and out of your lungs. After surgery. What are the risks? Risks of  using CHG include: A skin reaction. Hearing loss, if CHG gets in your ears and you have a perforated eardrum. Eye injury, if CHG gets in your eyes and is not rinsed out. The CHG product catching fire. Make sure that you avoid smoking and flames after applying CHG to your skin. Do not use CHG: If you have a chlorhexidine allergy or have previously reacted to chlorhexidine. On babies younger than 19 months of age. How to use CHG solution Use CHG only as told by your health care provider, and follow the instructions on the label. Use the full amount of CHG as directed. Usually, this is one bottle. During a shower Follow these steps when using CHG solution during a shower (unless your health care provider gives you different instructions): Start the shower. Use your normal soap and shampoo to wash your face and hair. Turn off the shower or move out of the shower stream. Pour the CHG onto a clean washcloth. Do not use any type of brush or rough-edged sponge. Starting at your neck, lather your body down to your toes. Make sure you follow these instructions: If you will be having surgery, pay special attention to the part of your body where you will be having surgery. Scrub this area for at least 1 minute. Do not use CHG on your head or face. If the solution gets into your ears or eyes, rinse them well with water. Avoid  your genital area. Avoid any areas of skin that have broken skin, cuts, or scrapes. Scrub your back and under your arms. Make sure to wash skin folds. Let the lather sit on your skin for 1-2 minutes or as long as told by your health care provider. Thoroughly rinse your entire body in the shower. Make sure that all body creases and crevices are rinsed well. Dry off with a clean towel. Do not put any substances on your body afterward--such as powder, lotion, or perfume--unless you are told to do so by your health care provider. Only use lotions that are recommended by the manufacturer. Put on clean clothes or pajamas. If it is the night before your surgery, sleep in clean sheets.  During a sponge bath Follow these steps when using CHG solution during a sponge bath (unless your health care provider gives you different instructions): Use your normal soap and shampoo to wash your face and hair. Pour the CHG onto a clean washcloth. Starting at your neck, lather your body down to your toes. Make sure you follow these instructions: If you will be having surgery, pay special attention to the part of your body where you will be having surgery. Scrub this area for at least 1 minute. Do not use CHG on your head or face. If the solution gets into your ears or eyes, rinse them well with water. Avoid your genital area. Avoid any areas of skin that have broken skin, cuts, or scrapes. Scrub your back and under your arms. Make sure to wash skin folds. Let the lather sit on your skin for 1-2 minutes or as long as told by your health care provider. Using a different clean, wet washcloth, thoroughly rinse your entire body. Make sure that all body creases and crevices are rinsed well. Dry off with a clean towel. Do not put any substances on your body afterward--such as powder, lotion, or perfume--unless you are told to do so by your health care provider. Only use lotions that are recommended by the manufacturer. Put  on clean  clothes or pajamas. If it is the night before your surgery, sleep in clean sheets. How to use CHG prepackaged cloths Only use CHG cloths as told by your health care provider, and follow the instructions on the label. Use the CHG cloth on clean, dry skin. Do not use the CHG cloth on your head or face unless your health care provider tells you to. When washing with the CHG cloth: Avoid your genital area. Avoid any areas of skin that have broken skin, cuts, or scrapes. Before surgery Follow these steps when using a CHG cloth to clean before surgery (unless your health care provider gives you different instructions): Using the CHG cloth, vigorously scrub the part of your body where you will be having surgery. Scrub using a back-and-forth motion for 3 minutes. The area on your body should be completely wet with CHG when you are done scrubbing. Do not rinse. Discard the cloth and let the area air-dry. Do not put any substances on the area afterward, such as powder, lotion, or perfume. Put on clean clothes or pajamas. If it is the night before your surgery, sleep in clean sheets.  For general bathing Follow these steps when using CHG cloths for general bathing (unless your health care provider gives you different instructions). Use a separate CHG cloth for each area of your body. Make sure you wash between any folds of skin and between your fingers and toes. Wash your body in the following order, switching to a new cloth after each step: The front of your neck, shoulders, and chest. Both of your arms, under your arms, and your hands. Your stomach and groin area, avoiding the genitals. Your right leg and foot. Your left leg and foot. The back of your neck, your back, and your buttocks. Do not rinse. Discard the cloth and let the area air-dry. Do not put any substances on your body afterward--such as powder, lotion, or perfume--unless you are told to do so by your health care provider. Only  use lotions that are recommended by the manufacturer. Put on clean clothes or pajamas. Contact a health care provider if: Your skin gets irritated after scrubbing. You have questions about using your solution or cloth. You swallow any chlorhexidine. Call your local poison control center (1-(973)534-5407 in the [place] Get help right away if: Your eyes itch badly, or they become very red or swollen. Your skin itches badly and is red or swollen. Your hearing changes. You have trouble seeing. You have swelling or tingling in your mouth or throat. You have trouble breathing. These symptoms may represent a serious problem that is an emergency. Do not wait to see if the symptoms will go away. Get medical help right away. Call your local emergency services (911 in the [place] Do not drive yourself to the hospital. Summary Chlorhexidine gluconate (CHG) is a germ-killing (antiseptic) solution that is used to clean the skin. Cleaning your skin with CHG may help to lower your risk for infection. You may be given CHG to use for bathing. It may be in a bottle or in a prepackaged cloth to use on your skin. Carefully follow your health care provider's instructions and the instructions on the product label. Do not use CHG if you have a chlorhexidine allergy. Contact your health care provider if your skin gets irritated after scrubbing. This information is not intended to replace advice given to you by your health care provider. Make sure you discuss any questions you have with your health care  provider. Document Revised: 02/15/2021 Document Reviewed: 02/15/2021 Elsevier Patient Education  2022 [employer].

## 2021-08-31 ENCOUNTER — Other Ambulatory Visit: Payer: Self-pay

## 2021-08-31 ENCOUNTER — Encounter (HOSPITAL_COMMUNITY)
Admission: RE | Admit: 2021-08-31 | Discharge: 2021-08-31 | Disposition: A | Discharge status: Home / Self Care | Payer: BC Managed Care – PPO | Source: Ambulatory Visit | Attending: Urology | Admitting: Urology

## 2021-08-31 DIAGNOSIS — G4730 Sleep apnea, unspecified: Secondary | ICD-10-CM | POA: Diagnosis not present

## 2021-08-31 DIAGNOSIS — Z885 Allergy status to narcotic agent status: Secondary | ICD-10-CM | POA: Diagnosis not present

## 2021-08-31 DIAGNOSIS — I10 Essential (primary) hypertension: Secondary | ICD-10-CM | POA: Diagnosis not present

## 2021-08-31 DIAGNOSIS — N50811 Right testicular pain: Secondary | ICD-10-CM | POA: Diagnosis not present

## 2021-08-31 DIAGNOSIS — Z888 Allergy status to other drugs, medicaments and biological substances status: Secondary | ICD-10-CM | POA: Diagnosis not present

## 2021-08-31 DIAGNOSIS — Z8719 Personal history of other diseases of the digestive system: Secondary | ICD-10-CM | POA: Diagnosis not present

## 2021-08-31 DIAGNOSIS — Z01818 Encounter for other preprocedural examination: Secondary | ICD-10-CM | POA: Insufficient documentation

## 2021-08-31 DIAGNOSIS — Z9049 Acquired absence of other specified parts of digestive tract: Secondary | ICD-10-CM | POA: Diagnosis not present

## 2021-08-31 LAB — CBC WITH DIFFERENTIAL/PLATELET
Abs Immature Granulocytes: 0.03 10*3/uL (ref 0.00–0.07)
Basophils Absolute: 0.1 10*3/uL (ref 0.0–0.1)
Basophils Relative: 1 %
Eosinophils Absolute: 0.3 10*3/uL (ref 0.0–0.5)
Eosinophils Relative: 5 %
HCT: 47.9 % (ref 39.0–52.0)
Hemoglobin: 16 g/dL (ref 13.0–17.0)
Immature Granulocytes: 1 %
Lymphocytes Relative: 20 %
Lymphs Abs: 1.3 10*3/uL (ref 0.7–4.0)
MCH: 29.8 pg (ref 26.0–34.0)
MCHC: 33.4 g/dL (ref 30.0–36.0)
MCV: 89.2 fL (ref 80.0–100.0)
Monocytes Absolute: 0.6 10*3/uL (ref 0.1–1.0)
Monocytes Relative: 9 %
Neutro Abs: 4 10*3/uL (ref 1.7–7.7)
Neutrophils Relative %: 64 %
Platelets: 150 10*3/uL (ref 150–400)
RBC: 5.37 MIL/uL (ref 4.22–5.81)
RDW: 12.7 % (ref 11.5–15.5)
WBC: 6.3 10*3/uL (ref 4.0–10.5)
nRBC: 0 % (ref 0.0–0.2)

## 2021-08-31 LAB — BASIC METABOLIC PANEL
Anion gap: 9 (ref 5–15)
BUN: 18 mg/dL (ref 6–20)
CO2: 27 mmol/L (ref 22–32)
Calcium: 9 mg/dL (ref 8.9–10.3)
Chloride: 104 mmol/L (ref 98–111)
Creatinine, Ser: 1.32 mg/dL — ABNORMAL HIGH (ref 0.61–1.24)
GFR, Estimated: >60 mL/min (ref >60)
Glucose, Bld: 104 mg/dL — ABNORMAL HIGH (ref 70–99)
Potassium: 3.6 mmol/L (ref 3.5–5.1)
Sodium: 140 mmol/L (ref 135–145)

## 2021-09-02 ENCOUNTER — Encounter (HOSPITAL_COMMUNITY)
Admission: RE | Disposition: A | Discharge status: Home / Self Care | Payer: Self-pay | Source: Home / Self Care | Attending: Urology

## 2021-09-02 ENCOUNTER — Ambulatory Visit (HOSPITAL_COMMUNITY): Payer: BC Managed Care – PPO | Admitting: Anesthesiology

## 2021-09-02 ENCOUNTER — Other Ambulatory Visit: Payer: Self-pay

## 2021-09-02 ENCOUNTER — Ambulatory Visit (HOSPITAL_COMMUNITY)
Admission: RE | Admit: 2021-09-02 | Discharge: 2021-09-02 | Disposition: A | Discharge status: Home / Self Care | Payer: BC Managed Care – PPO | Attending: Urology | Admitting: Urology

## 2021-09-02 ENCOUNTER — Encounter (HOSPITAL_COMMUNITY): Payer: Self-pay | Admitting: Urology

## 2021-09-02 DIAGNOSIS — Z888 Allergy status to other drugs, medicaments and biological substances status: Secondary | ICD-10-CM | POA: Insufficient documentation

## 2021-09-02 DIAGNOSIS — I10 Essential (primary) hypertension: Secondary | ICD-10-CM | POA: Insufficient documentation

## 2021-09-02 DIAGNOSIS — Z9049 Acquired absence of other specified parts of digestive tract: Secondary | ICD-10-CM | POA: Insufficient documentation

## 2021-09-02 DIAGNOSIS — Z8719 Personal history of other diseases of the digestive system: Secondary | ICD-10-CM | POA: Insufficient documentation

## 2021-09-02 DIAGNOSIS — G4730 Sleep apnea, unspecified: Secondary | ICD-10-CM | POA: Insufficient documentation

## 2021-09-02 DIAGNOSIS — Z885 Allergy status to narcotic agent status: Secondary | ICD-10-CM | POA: Insufficient documentation

## 2021-09-02 DIAGNOSIS — N50811 Right testicular pain: Secondary | ICD-10-CM | POA: Diagnosis not present

## 2021-09-02 HISTORY — PX: VASECTOMY: SHX75

## 2021-09-02 HISTORY — PX: VARICOCELECTOMY: SHX1084

## 2021-09-02 SURGERY — VASECTOMY
Anesthesia: General | Laterality: Right

## 2021-09-02 MED ORDER — LIDOCAINE HCL (PF) 2 % IJ SOLN
INTRAMUSCULAR | Status: AC
  Filled 2021-09-02: qty 5

## 2021-09-02 MED ORDER — MEPERIDINE HCL 50 MG/ML IJ SOLN: 6.2500 mg | INTRAMUSCULAR | Status: DC | PRN

## 2021-09-02 MED ORDER — PROPOFOL 10 MG/ML IV BOLUS
INTRAVENOUS | Status: AC
  Filled 2021-09-02: qty 40

## 2021-09-02 MED ORDER — 0.9 % SODIUM CHLORIDE (POUR BTL) OPTIME
TOPICAL | Status: DC | PRN
  Administered 2021-09-02: 1000 mL

## 2021-09-02 MED ORDER — DEXMEDETOMIDINE (PRECEDEX) IN NS 20 MCG/5ML (4 MCG/ML) IV SYRINGE
PREFILLED_SYRINGE | INTRAVENOUS | Status: AC
  Filled 2021-09-02: qty 5

## 2021-09-02 MED ORDER — PROPOFOL 10 MG/ML IV BOLUS
INTRAVENOUS | Status: DC | PRN
  Administered 2021-09-02: 40 mg via INTRAVENOUS
  Administered 2021-09-02: 260 mg via INTRAVENOUS

## 2021-09-02 MED ORDER — GLYCOPYRROLATE PF 0.2 MG/ML IJ SOSY
PREFILLED_SYRINGE | INTRAMUSCULAR | Status: AC
  Filled 2021-09-02: qty 1

## 2021-09-02 MED ORDER — ORAL CARE MOUTH RINSE: 15.0000 mL | Freq: Once | OROMUCOSAL | Status: AC

## 2021-09-02 MED ORDER — CEFAZOLIN SODIUM-DEXTROSE 2-4 GM/100ML-% IV SOLN: 2.0000 g | INTRAVENOUS | Status: DC

## 2021-09-02 MED ORDER — ONDANSETRON HCL 4 MG/2ML IJ SOLN
INTRAMUSCULAR | Status: DC | PRN
  Administered 2021-09-02: 4 mg via INTRAVENOUS

## 2021-09-02 MED ORDER — KETOROLAC TROMETHAMINE 30 MG/ML IJ SOLN
INTRAMUSCULAR | Status: AC
  Filled 2021-09-02: qty 1

## 2021-09-02 MED ORDER — FENTANYL CITRATE PF 50 MCG/ML IJ SOSY
50.0000 ug | PREFILLED_SYRINGE | INTRAMUSCULAR | Status: AC | PRN
  Administered 2021-09-02 (×2): 50 ug via INTRAVENOUS
  Filled 2021-09-02: qty 1

## 2021-09-02 MED ORDER — SEVOFLURANE IN SOLN
RESPIRATORY_TRACT | Status: AC
  Filled 2021-09-02: qty 250

## 2021-09-02 MED ORDER — DEXMEDETOMIDINE (PRECEDEX) IN NS 20 MCG/5ML (4 MCG/ML) IV SYRINGE
PREFILLED_SYRINGE | INTRAVENOUS | Status: DC | PRN
  Administered 2021-09-02: 10 ug via INTRAVENOUS

## 2021-09-02 MED ORDER — ONDANSETRON HCL 4 MG/2ML IJ SOLN
INTRAMUSCULAR | Status: AC
  Filled 2021-09-02: qty 2

## 2021-09-02 MED ORDER — FENTANYL CITRATE PF 50 MCG/ML IJ SOSY
PREFILLED_SYRINGE | INTRAMUSCULAR | Status: AC
  Filled 2021-09-02: qty 1

## 2021-09-02 MED ORDER — FENTANYL CITRATE (PF) 100 MCG/2ML IJ SOLN
INTRAMUSCULAR | Status: AC
  Filled 2021-09-02: qty 2

## 2021-09-02 MED ORDER — DEXAMETHASONE SODIUM PHOSPHATE 4 MG/ML IJ SOLN
INTRAMUSCULAR | Status: DC | PRN
  Administered 2021-09-02: 4 mg via INTRAVENOUS

## 2021-09-02 MED ORDER — GLYCOPYRROLATE PF 0.2 MG/ML IJ SOSY
PREFILLED_SYRINGE | INTRAMUSCULAR | Status: DC | PRN
  Administered 2021-09-02 (×2): .1 mg via INTRAVENOUS

## 2021-09-02 MED ORDER — BUPIVACAINE HCL (PF) 0.25 % IJ SOLN
INTRAMUSCULAR | Status: AC
  Filled 2021-09-02: qty 30

## 2021-09-02 MED ORDER — HYDROCODONE-ACETAMINOPHEN 5-325 MG PO TABS: 1.0000 | ORAL_TABLET | Freq: Four times a day (QID) | ORAL | 0 refills | Status: DC | PRN

## 2021-09-02 MED ORDER — DEXAMETHASONE SODIUM PHOSPHATE 10 MG/ML IJ SOLN
INTRAMUSCULAR | Status: AC
  Filled 2021-09-02: qty 1

## 2021-09-02 MED ORDER — CHLORHEXIDINE GLUCONATE 0.12 % MT SOLN
15.0000 mL | Freq: Once | OROMUCOSAL | Status: AC
  Administered 2021-09-02: 15 mL via OROMUCOSAL
  Filled 2021-09-02: qty 15

## 2021-09-02 MED ORDER — FENTANYL CITRATE (PF) 100 MCG/2ML IJ SOLN
INTRAMUSCULAR | Status: DC | PRN
  Administered 2021-09-02: 50 ug via INTRAVENOUS

## 2021-09-02 MED ORDER — HYDROMORPHONE HCL 1 MG/ML IJ SOLN
0.2500 mg | INTRAMUSCULAR | Status: DC | PRN
  Administered 2021-09-02 (×3): 0.5 mg via INTRAVENOUS
  Filled 2021-09-02 (×3): qty 0.5

## 2021-09-02 MED ORDER — CEFAZOLIN IN SODIUM CHLORIDE 3-0.9 GM/100ML-% IV SOLN
3.0000 g | INTRAVENOUS | Status: AC
  Administered 2021-09-02: 3 g via INTRAVENOUS
  Filled 2021-09-02: qty 100

## 2021-09-02 MED ORDER — LIDOCAINE 2% (20 MG/ML) 5 ML SYRINGE
INTRAMUSCULAR | Status: DC | PRN
  Administered 2021-09-02: 100 mg via INTRAVENOUS

## 2021-09-02 MED ORDER — HYDROCODONE-ACETAMINOPHEN 5-325 MG PO TABS
ORAL_TABLET | ORAL | Status: AC
  Filled 2021-09-02: qty 1

## 2021-09-02 MED ORDER — BUPIVACAINE HCL (PF) 0.25 % IJ SOLN
INTRAMUSCULAR | Status: DC | PRN
  Administered 2021-09-02: 10 mL

## 2021-09-02 MED ORDER — LACTATED RINGERS IV SOLN: INTRAVENOUS | Status: DC

## 2021-09-02 MED ORDER — MIDAZOLAM HCL 5 MG/5ML IJ SOLN
INTRAMUSCULAR | Status: DC | PRN
  Administered 2021-09-02: 2 mg via INTRAVENOUS

## 2021-09-02 MED ORDER — MIDAZOLAM HCL 2 MG/2ML IJ SOLN
INTRAMUSCULAR | Status: AC
  Filled 2021-09-02: qty 2

## 2021-09-02 MED ORDER — ONDANSETRON HCL 4 MG/2ML IJ SOLN
4.0000 mg | Freq: Once | INTRAMUSCULAR | Status: AC | PRN
  Administered 2021-09-02: 4 mg via INTRAVENOUS
  Filled 2021-09-02: qty 2

## 2021-09-02 MED ORDER — HYDROCODONE-ACETAMINOPHEN 5-325 MG PO TABS
1.0000 | ORAL_TABLET | Freq: Once | ORAL | Status: AC
  Administered 2021-09-02: 1 via ORAL

## 2021-09-02 SURGICAL SUPPLY — 44 items
ADH SKN CLS APL DERMABOND .7 (GAUZE/BANDAGES/DRESSINGS) ×2
BANDAGE GAUZE ELAST BULKY 4 IN (GAUZE/BANDAGES/DRESSINGS) ×3 IMPLANT
BLADE SURG 15 STRL LF DISP TIS (BLADE) ×2 IMPLANT
BLADE SURG 15 STRL SS (BLADE) ×3
BNDG GAUZE ELAST 4 BULKY (GAUZE/BANDAGES/DRESSINGS) ×3 IMPLANT
CLOTH BEACON ORANGE TIMEOUT ST (SAFETY) ×3 IMPLANT
COVER LIGHT HANDLE STERIS (MISCELLANEOUS) ×6 IMPLANT
COVER PROBE W GEL 5X96 (DRAPES) ×3 IMPLANT
DECANTER SPIKE VIAL GLASS SM (MISCELLANEOUS) ×3 IMPLANT
DERMABOND ADVANCED (GAUZE/BANDAGES/DRESSINGS) ×1
DERMABOND ADVANCED .7 DNX12 (GAUZE/BANDAGES/DRESSINGS) ×2 IMPLANT
DRAIN PENROSE 12X.25 LTX STRL (MISCELLANEOUS) ×3 IMPLANT
ELECT NEEDLE BLADE 2-5/6 (NEEDLE) ×3 IMPLANT
ELECT NEEDLE TIP 2.8 STRL (NEEDLE) ×3 IMPLANT
ELECT REM PT RETURN 9FT ADLT (ELECTROSURGICAL) ×3
ELECTRODE REM PT RTRN 9FT ADLT (ELECTROSURGICAL) ×2 IMPLANT
GAUZE 4X4 16PLY ~~LOC~~+RFID DBL (SPONGE) ×3 IMPLANT
GAUZE SPONGE 4X4 12PLY STRL (GAUZE/BANDAGES/DRESSINGS) ×3 IMPLANT
GLOVE SURG POLYISO LF SZ8 (GLOVE) ×3 IMPLANT
GLOVE SURG UNDER POLY LF SZ7 (GLOVE) ×6 IMPLANT
GOWN STRL REUS W/TWL LRG LVL3 (GOWN DISPOSABLE) ×6 IMPLANT
GOWN STRL REUS W/TWL XL LVL3 (GOWN DISPOSABLE) ×3 IMPLANT
INST SET MINOR GENERAL (KITS) ×3 IMPLANT
KIT TURNOVER KIT A (KITS) ×3 IMPLANT
LOOP VESSEL MAXI BLUE (MISCELLANEOUS) ×3 IMPLANT
MANIFOLD NEPTUNE II (INSTRUMENTS) ×3 IMPLANT
NEEDLE HYPO 18GX1.5 BLUNT FILL (NEEDLE) ×3 IMPLANT
NEEDLE HYPO 25X1 1.5 SAFETY (NEEDLE) ×3 IMPLANT
NS IRRIG 500ML POUR BTL (IV SOLUTION) ×3 IMPLANT
PACK MINOR (CUSTOM PROCEDURE TRAY) ×3 IMPLANT
PAD ARMBOARD 7.5X6 YLW CONV (MISCELLANEOUS) ×3 IMPLANT
PENCIL SMOKE EVACUATOR (MISCELLANEOUS) ×3 IMPLANT
SET BASIN LINEN APH (SET/KITS/TRAYS/PACK) ×3 IMPLANT
SOL PREP PROV IODINE SCRUB 4OZ (MISCELLANEOUS) ×3 IMPLANT
SUPPORT SCROTAL LG STRP (MISCELLANEOUS) ×3 IMPLANT
SURGILUBE 2OZ TUBE FLIPTOP (MISCELLANEOUS) ×3 IMPLANT
SUT CHROMIC 3 0 PS 2 (SUTURE) ×3 IMPLANT
SUT MNCRL AB 4-0 PS2 18 (SUTURE) ×3 IMPLANT
SUT SILK 2 0 (SUTURE) ×3
SUT SILK 2-0 18XBRD TIE 12 (SUTURE) ×2 IMPLANT
SUT VIC AB 3-0 SH 27 (SUTURE) ×3
SUT VIC AB 3-0 SH 27X BRD (SUTURE) ×2 IMPLANT
SYR 30ML LL (SYRINGE) ×3 IMPLANT
SYR CONTROL 10ML LL (SYRINGE) ×3 IMPLANT

## 2021-09-02 NOTE — Transfer of Care (Signed)
Immediate Anesthesia Transfer of Care Note  Patient: AMOUS BOATENG  Procedure(s) Performed: VASECTOMY VARICOCELECTOMY-right subingunial neurolysis (Right)  Patient Location: PACU  Anesthesia Type:General  Level of Consciousness: drowsy  Airway & Oxygen Therapy: Patient Spontanous Breathing and Patient connected to face mask oxygen  Post-op Assessment: Report given to RN and Post -op Vital signs reviewed and stable  Post vital signs: Reviewed and stable  Last Vitals:  Vitals Value Taken Time  BP    Temp    Pulse 70 09/02/21 1012  Resp 13 09/02/21 1012  SpO2 99 % 09/02/21 1012  Vitals shown include unvalidated device data.  Last Pain:  Vitals:   09/02/21 0803  TempSrc: Oral  PainSc: 7       Patients Stated Pain Goal: 9 ([id number])  Complications: No notable events documented.

## 2021-09-02 NOTE — H&P (Signed)
Urology Admission H&P  Chief Complaint: right testicular pain  History of Present Illness: Chase Wyatt is a 50yo with a history of right testicular pain after inguinal hernia repair. 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   BACK SURGERY     multiple   CARDIAC CATHETERIZATION  2013   [place], no stents   CHOLECYSTECTOMY  10/28/2011   Procedure: LAPAROSCOPIC CHOLECYSTECTOMY;  Surgeon: Jamesetta So;  Location: [facility];  Service: General;  Laterality: N/A;   INGUINAL HERNIA REPAIR Left 08/07/2020   Procedure: HERNIA REPAIR INGUINAL ADULT using MESH MARLEX PLUG MEDIUM;  Surgeon: Aviva Signs, MD;  Location: [facility];  Service: General;  Laterality: Left;   INGUINAL HERNIA REPAIR Right 01/11/2021   Procedure: HERNIA REPAIR INGUINAL ADULT;  Surgeon: Aviva Signs, MD;  Location: [facility];  Service: General;  Laterality: Right;   LAMINECTOMY THORACIC SPINE W/ PLACEMENT SPINAL CORD STIMULATOR     SHOULDER OPEN ROTATOR CUFF REPAIR Left    [place]   UMBILICAL HERNIA REPAIR N/A 08/07/2020   Procedure: HERNIA REPAIR UMBILICAL ADULT;  Surgeon: Aviva Signs, MD;  Location: [facility];  Service: General;  Laterality: N/A;   VARICOCELECTOMY Right 06/10/2021   Procedure: right, neurolysis-subinguial;  Surgeon: Cleon Gustin, MD;  Location: [facility];  Service: Urology;  Laterality: Right;    Home Medications:  Current Facility-Administered Medications  Medication Dose Route Frequency Provider Last Rate Last Admin   ceFAZolin (ANCEF) IVPB  3g/100 mL premix  3 g Intravenous 30 min Pre-Op Oleda Borski, Candee Furbish, MD       chlorhexidine (PERIDEX) 0.12 % solution 15 mL  15 mL Mouth/Throat Once Battula, Alisa Graff, MD       Or   MEDLINE mouth rinse  15 mL Mouth Rinse Once Battula, Alisa Graff, MD       lactated ringers infusion   Intravenous Continuous Denese Killings, MD       Allergies:  Allergies  Allergen Reactions   Benzoin Rash    Blisters   Freederm Adhesive Remover [New Skin] Itching   Meloxicam Hives   Oxycontin [Oxycodone] Other (See Comments)    Headache    Family History  Problem Relation Age of Onset   Hypotension Neg Hx    Anesthesia problems Neg Hx    Malignant hyperthermia Neg Hx    Pseudochol deficiency Neg Hx    Social History:  reports that he has never smoked. He has quit using smokeless tobacco. He reports current alcohol use of about 1.0 standard drink per week. He reports that he does not use drugs.  Review of Systems  Genitourinary:  Positive for testicular pain.  All other systems reviewed and are negative.  Physical Exam:  Vital signs in last 24 hours: Temp:  [97.9 F (36.6 C)] 97.9 F (36.6 C) (09/15 0803) Pulse Rate:  [61] 61 (09/15 0803) Resp:  [16] 16 (09/15 0803) BP: (144)/(89)  144/89 (09/15 0803) SpO2:  [99 %] 99 % (09/15 0803) Weight:  [124.7 kg] 124.7 kg (09/15 0803) Physical Exam Vitals reviewed.  Constitutional:      Appearance: Normal appearance.  HENT:     Head: Normocephalic and atraumatic.  Eyes:     Extraocular Movements: Extraocular movements intact.     Pupils: Pupils are equal, round, and reactive to light.  Cardiovascular:     Rate and Rhythm: Normal rate and regular rhythm.  Pulmonary:     Effort: Pulmonary effort is normal. No respiratory distress.  Abdominal:     General: Abdomen is flat. There is no distension.  Musculoskeletal:        General: No swelling. Normal range of motion.     Cervical back: Normal range of motion and neck supple.  Skin:     General: Skin is warm and dry.  Neurological:     General: No focal deficit present.     Mental Status: He is alert and oriented to person, place, and time.  Psychiatric:        Mood and Affect: Mood normal.        Behavior: Behavior normal.        Thought Content: Thought content normal.        Judgment: Judgment normal.    Laboratory Data:  No results found for this or any previous visit (from the past 24 hour(s)). No results found for this or any previous visit (from the past 240 hour(s)). Creatinine: Recent Labs    08/31/21 1537  CREATININE 1.32*   Baseline Creatinine: 1.3  Impression/Assessment:  50yo with right testis pain  Plan:  The risks/benefits/alternatives to right neurolysis and right vasectomy was explained to the patient and he understands and wishes to proceed with surgery  Chase Wyatt 09/02/2021, 8:14 AM

## 2021-09-02 NOTE — Anesthesia Postprocedure Evaluation (Signed)
Anesthesia Post Note  Patient: SHADDIX BOUTTE  Procedure(s) Performed: VASECTOMY VARICOCELECTOMY-right subingunial neurolysis (Right)  Patient location during evaluation: PACU Anesthesia Type: General Level of consciousness: awake and alert and oriented Pain management: pain level controlled Vital Signs Assessment: post-procedure vital signs reviewed and stable Respiratory status: spontaneous breathing and respiratory function stable Cardiovascular status: blood pressure returned to baseline and stable Postop Assessment: no apparent nausea or vomiting Anesthetic complications: no   No notable events documented.   Last Vitals:  Vitals:   09/02/21 1115 09/02/21 1134  BP: (!) 130/104 (!) 159/90  Pulse: 66 (!) 59  Resp: 15 18  Temp:  36.5 C  SpO2: 94% 96%    Last Pain:  Vitals:   09/02/21 1134  TempSrc: Axillary  PainSc: 6                  Aariyana Manz C Creasie Lacosse

## 2021-09-02 NOTE — Progress Notes (Signed)
Patient has spinal cord stimulator. Pt stated that is was turned off prior to coming to the hospital.  Anesthesiology notified.   Patient advised to test stimulator prior to being discharged.

## 2021-09-02 NOTE — Anesthesia Procedure Notes (Signed)
Procedure Name: LMA Insertion Date/Time: 09/02/2021 8:42 AM Performed by: Orlie Dakin, CRNA Pre-anesthesia Checklist: Patient identified, Emergency Drugs available, Suction available and Patient being monitored Patient Re-evaluated:Patient Re-evaluated prior to induction Oxygen Delivery Method: Circle system utilized Preoxygenation: Pre-oxygenation with 100% oxygen Induction Type: IV induction Ventilation: Mask ventilation without difficulty LMA: LMA inserted LMA Size: 5.0 Tube type: Oral Number of attempts: 1 Placement Confirmation: positive ETCO2 Tube secured with: Tape Dental Injury: Teeth and Oropharynx as per pre-operative assessment

## 2021-09-02 NOTE — Op Note (Signed)
Preoperative diagnosis: right orchalgia  Postoperative diagnosis: Same  Procedure: 1. Right varicocelectomy 2. Right subinguinal neurolysis  3. vasectomy  Attending: [name], MD  Anesthesia: General  History of blood loss: Minimal  Antibiotics: ancef  Drains: none  Specimens: rigth vas defernes cross section  Findings: numerous tortuous veins in spermatic cord. Testicular artery identified and preserved using doppler.  Indications: Patient is a 50 year old male with a history of right testicular pain after inguinal hernia repair who failed inguinal neurolysis .  After discussing treatment options he decided to proceed with varicocelectomy, neurolysis and vasectomy.   Procedure in detail: Prior to procedure consent was obtained.  Patient was brought to the operating room and a brief timeout was done to ensure correct patient, correct procedure, correct site.  General anesthesia was administered and patient was placed in supine position.  His genitalia and abdomen was then prepped and draped in usual sterile fashion.  A 4 cm incision was made in the subinguinal region.  We dissected down through the subcutaneous tissue to until we reached the spermatic cord.  The spermatic cord was identified and a Penrose drain was placed under the cord.  We then opened the cord with sharp dissection. We proceeded to identify the testicular artery with the doppler. Once the artery was identified and loop was placed around it and it was excluded from the remainder of the spermatic cord. We then proceeded to ligate the veins in the cord with 3-0 silk ties. We also ligated the vas deferens with 0 silk tie. Once all the veins were ligated we once again check for flow in the testicular artery and we noted good flow. We then inspected the operative field and no residual bleeding was noted. We then closed the subcutaneous tissues in 2 layers with 2-0 Vicryl in a running fashion.  We then closed the skin with  4-0 Monocryl in a running fashion.  Skin glue was then placed over the incision. We then placed a scrotal fluff and this then concluded the procedure which was well tolerated by the patient.  Complications: None  Condition: Stable, extubated, transferred to PACU.  Plan: Patient is to be discharged home.  He is to follow up in 2 weeks for wound check.

## 2021-09-02 NOTE — Anesthesia Preprocedure Evaluation (Signed)
Anesthesia Evaluation  Patient identified by MRN, date of birth, ID band Patient awake    Reviewed: Allergy & Precautions, NPO status , Patient's Chart, lab work & pertinent test results  History of Anesthesia Complications (+) PONV, PROLONGED EMERGENCE and history of anesthetic complications  Airway Mallampati: III  TM Distance: >3 FB Neck ROM: Full    Dental  (+) Dental Advisory Given, Missing   Pulmonary sleep apnea and Continuous Positive Airway Pressure Ventilation ,    Pulmonary exam normal breath sounds clear to auscultation       Cardiovascular Exercise Tolerance: Good hypertension, Pt. on medications Normal cardiovascular exam+ Valvular Problems/Murmurs  Rhythm:Regular Rate:Normal     Neuro/Psych Anxiety  Neuromuscular disease    GI/Hepatic GERD  Medicated and Controlled,(+)     substance abuse  alcohol use,   Endo/Other  negative endocrine ROS  Renal/GU Renal InsufficiencyRenal disease     Musculoskeletal  (+) Arthritis  (thoracic laminectomy, spinal cord stimulator), thoracic laminectomy, spinal cord stimulato   Abdominal   Peds  Hematology   Anesthesia Other Findings   Reproductive/Obstetrics                            Anesthesia Physical  Anesthesia Plan  ASA: 3  Anesthesia Plan: General   Post-op Pain Management:    Induction: Intravenous  PONV Risk Score and Plan: 4 or greater and Treatment may vary due to age or medical condition, Ondansetron, Dexamethasone, Midazolam and Scopolamine patch - Pre-op  Airway Management Planned: LMA and Oral ETT  Additional Equipment:   Intra-op Plan:   Post-operative Plan: Extubation in OR  Informed Consent: I have reviewed the patients History and Physical, chart, labs and discussed the procedure including the risks, benefits and alternatives for the proposed anesthesia with the patient or authorized representative who has  indicated his/her understanding and acceptance.     Dental advisory given  Plan Discussed with: CRNA and Surgeon  Anesthesia Plan Comments:        Anesthesia Quick Evaluation

## 2021-09-03 ENCOUNTER — Encounter (HOSPITAL_COMMUNITY): Payer: Self-pay | Admitting: Urology

## 2021-09-03 LAB — SURGICAL PATHOLOGY

## 2021-09-15 ENCOUNTER — Ambulatory Visit (INDEPENDENT_AMBULATORY_CARE_PROVIDER_SITE_OTHER): Payer: BC Managed Care – PPO | Admitting: Urology

## 2021-09-15 ENCOUNTER — Encounter: Payer: Self-pay | Admitting: Urology

## 2021-09-15 ENCOUNTER — Other Ambulatory Visit: Payer: Self-pay

## 2021-09-15 VITALS — BP 167/94 | HR 73 | Temp 98.5°F | Wt 286.8 lb

## 2021-09-15 DIAGNOSIS — R1031 Right lower quadrant pain: Secondary | ICD-10-CM

## 2021-09-15 NOTE — Progress Notes (Signed)
09/15/2021 11:26 AM   Chase Wyatt 01/01/1971 [id number]  Referring provider: [name], MD [place],  [place]  Right inguinal pain   HPI: Chase Wyatt is a 50yo here for followup after right neurolysis for right inguinal/testis pain. The testicular pain has resolved and his scrotum/testis is numb. He has mild burning on his right medial thigh. No drainage, erythema, induration from incision. 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   BACK SURGERY     multiple   CARDIAC CATHETERIZATION  2013   [place], no stents   CHOLECYSTECTOMY  10/28/2011   Procedure: LAPAROSCOPIC CHOLECYSTECTOMY;  Surgeon: Jamesetta So;  Location: [facility];  Service: General;  Laterality: N/A;   INGUINAL HERNIA REPAIR Left 08/07/2020   Procedure: HERNIA REPAIR INGUINAL ADULT using MESH MARLEX PLUG MEDIUM;  Surgeon: Aviva Signs, MD;  Location: [facility];  Service: General;  Laterality: Left;   INGUINAL HERNIA REPAIR Right 01/11/2021   Procedure: HERNIA REPAIR INGUINAL ADULT;  Surgeon: Aviva Signs, MD;  Location: [facility];  Service: General;  Laterality: Right;   LAMINECTOMY THORACIC SPINE W/ PLACEMENT SPINAL CORD STIMULATOR     SHOULDER OPEN ROTATOR CUFF REPAIR Left    [place]   UMBILICAL HERNIA REPAIR N/A 08/07/2020   Procedure: HERNIA REPAIR UMBILICAL ADULT;  Surgeon: Aviva Signs, MD;  Location: [facility];  Service: General;  Laterality: N/A;   VARICOCELECTOMY Right 06/10/2021   Procedure: right, neurolysis-subinguial;  Surgeon:  Cleon Gustin, MD;  Location: [facility];  Service: Urology;  Laterality: Right;   VARICOCELECTOMY Right 09/02/2021   Procedure: VARICOCELECTOMY-right subingunial neurolysis;  Surgeon: Cleon Gustin, MD;  Location: [facility];  Service: Urology;  Laterality: Right;   VASECTOMY N/A 09/02/2021   Procedure: VASECTOMY;  Surgeon: Cleon Gustin, MD;  Location: [facility];  Service: Urology;  Laterality: N/A;    Home Medications:  Allergies as of 09/15/2021       Reactions   Benzoin Rash   Blisters   Freederm Adhesive Remover [new Skin] Itching   Meloxicam Hives   Oxycontin [oxycodone] Other (See Comments)   Headache        Medication List        Accurate as of September 15, 2021 11:26 AM. If you have any questions, ask your nurse or doctor.          ALOE VERA EX Apply 1 application topically daily as needed (sun burn).   ALPRAZolam 0.5 MG tablet Commonly known as: XANAX Take 0.5 mg by mouth daily as needed for anxiety.   aspirin EC 81 MG tablet Take 81 mg by mouth 3 (three) times a week. Swallow whole.   cetirizine 10 MG tablet Commonly known as: ZYRTEC Take 10 mg by mouth daily.   eletriptan 40 MG tablet Commonly known as: RELPAX Take 40 mg by mouth daily as needed for migraine.   fluticasone 50 MCG/ACT nasal spray  Commonly known as: FLONASE Place 1 spray into both nostrils daily.   HYDROcodone-acetaminophen 5-325 MG tablet Commonly known as: Norco Take 1 tablet by mouth every 6 (six) hours as needed for moderate pain.   ibuprofen 200 MG tablet Commonly known as: ADVIL Take 600 mg by mouth every 6 (six) hours as needed for headache.   multivitamin tablet Take 1 tablet by mouth daily.   naproxen 500 MG tablet Commonly known as: NAPROSYN Take 500 mg by mouth daily as needed for migraine.   omeprazole 40 MG capsule Commonly known as: PRILOSEC Take 40 mg by mouth daily.   ondansetron 4 MG tablet Commonly known as: ZOFRAN Take 4 mg by mouth every 8  (eight) hours as needed (migraine related nausea).   testosterone cypionate 200 MG/ML injection Commonly known as: DEPOTESTOSTERONE CYPIONATE Inject 600 mg into the muscle every 14 (fourteen) days.   valACYclovir 1000 MG tablet Commonly known as: VALTREX Take 1,000 mg by mouth daily as needed (fever blisters).        Allergies:  Allergies  Allergen Reactions   Benzoin Rash    Blisters   Freederm Adhesive Remover [New Skin] Itching   Meloxicam Hives   Oxycontin [Oxycodone] Other (See Comments)    Headache    Family History: Family History  Problem Relation Age of Onset   Hypotension Neg Hx    Anesthesia problems Neg Hx    Malignant hyperthermia Neg Hx    Pseudochol deficiency Neg Hx     Social History:  reports that he has never smoked. He has quit using smokeless tobacco. He reports current alcohol use of about 1.0 standard drink per week. He reports that he does not use drugs.  ROS: All other review of systems were reviewed and are negative except what is noted above in HPI  Physical Exam: BP (!) 167/94 (BP Location: Left Arm, Patient Position: Sitting, Cuff Size: Normal)   Pulse 73   Temp 98.5 F (36.9 C)   Wt 286 lb 12.8 oz (130.1 kg)   BMI 34.91 kg/m   Constitutional:  Alert and oriented, No acute distress. HEENT: [place] AT, moist mucus membranes.  Trachea midline, no masses. Cardiovascular: No clubbing, cyanosis, or edema. Respiratory: Normal respiratory effort, no increased work of breathing. GI: Abdomen is soft, nontender, nondistended, no abdominal masses GU: No CVA tenderness.  Lymph: No cervical or inguinal lymphadenopathy. Skin: No rashes, bruises or suspicious lesions. Neurologic: Grossly intact, no focal deficits, moving all 4 extremities. Psychiatric: Normal mood and affect.  Laboratory Data: Lab Results  Component Value Date   WBC 6.3 08/31/2021   HGB 16.0 08/31/2021   HCT 47.9 08/31/2021   MCV 89.2 08/31/2021   PLT 150 08/31/2021    Lab  Results  Component Value Date   CREATININE 1.32 (H) 08/31/2021    No results found for: PSA  No results found for: TESTOSTERONE  No results found for: HGBA1C  Urinalysis    Component Value Date/Time   APPEARANCEUR Clear 07/02/2021 1507   GLUCOSEU Negative 07/02/2021 1507   BILIRUBINUR Negative 07/02/2021 1507   PROTEINUR Negative 07/02/2021 1507   NITRITE Negative 07/02/2021 1507   LEUKOCYTESUR Negative 07/02/2021 1507    Lab Results  Component Value Date   LABMICR Comment 07/02/2021    Pertinent Imaging:  No results found for this or any previous visit.  No results found for this or any previous visit.  No results found for this or any previous visit.  No results found for this  or any previous visit.  No results found for this or any previous visit.  No results found for this or any previous visit.  No results found for this or any previous visit.  No results found for this or any previous visit.   Assessment & Plan:    1. Right inguinal pain -improved after neurolysis. RTC 8-10 weeks  - Urinalysis, Routine w reflex microscopic   Return in about 8 weeks (around 11/10/2021).  [name], MD  [facility] [place]

## 2021-09-21 ENCOUNTER — Encounter: Payer: Self-pay | Admitting: Urology

## 2021-11-10 ENCOUNTER — Ambulatory Visit: Payer: BC Managed Care – PPO | Admitting: Urology

## 2021-12-21 ENCOUNTER — Ambulatory Visit: Payer: BC Managed Care – PPO | Admitting: Urology

## 2021-12-22 ENCOUNTER — Ambulatory Visit: Payer: BC Managed Care – PPO | Admitting: Urology

## 2021-12-22 DIAGNOSIS — R1031 Right lower quadrant pain: Secondary | ICD-10-CM

## 2022-02-02 ENCOUNTER — Other Ambulatory Visit: Payer: Self-pay

## 2022-02-02 ENCOUNTER — Ambulatory Visit: Payer: BC Managed Care – PPO | Admitting: Urology

## 2022-02-02 ENCOUNTER — Encounter: Payer: Self-pay | Admitting: Urology

## 2022-02-02 VITALS — BP 179/94 | HR 77

## 2022-02-02 DIAGNOSIS — R1032 Left lower quadrant pain: Secondary | ICD-10-CM

## 2022-02-02 NOTE — Progress Notes (Signed)
02/02/2022 2:31 PM   Chase Wyatt 31-Dec-1970 [id number]  Referring provider: [name], MD [place],  [place]  Left testis pain   HPI: [name] is a 51yo here for evaluation of left testis pain. He has a hx of right testis pain after right inguinal hernia repair requiring neurolysis. His right testis pain has resolved. He left testis pain has been gradually worsening over the past 2-3 months. The pain is sharp, intermittent, mild to moderate and nonraditing. The pain is worse with activity. No significant LUTS. No recent trauma to the left testis. No other associated symptoms. No other exacerbating/alleviaitng events.    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   BACK SURGERY     multiple   CARDIAC CATHETERIZATION  2013   [place], no stents   CHOLECYSTECTOMY  10/28/2011   Procedure: LAPAROSCOPIC CHOLECYSTECTOMY;  Surgeon: Jamesetta So;  Location: [facility];  Service: General;  Laterality: N/A;   INGUINAL HERNIA REPAIR Left 08/07/2020   Procedure: HERNIA REPAIR INGUINAL ADULT using MESH MARLEX PLUG MEDIUM;  Surgeon: Aviva Signs, MD;  Location: [facility];  Service: General;  Laterality: Left;   INGUINAL HERNIA REPAIR Right 01/11/2021   Procedure: HERNIA REPAIR INGUINAL ADULT;  Surgeon: Aviva Signs, MD;  Location: [facility];  Service: General;  Laterality: Right;   LAMINECTOMY THORACIC SPINE W/ PLACEMENT SPINAL CORD STIMULATOR     SHOULDER OPEN ROTATOR CUFF REPAIR Left    [place]   UMBILICAL HERNIA REPAIR N/A 08/07/2020   Procedure:  HERNIA REPAIR UMBILICAL ADULT;  Surgeon: Aviva Signs, MD;  Location: [facility];  Service: General;  Laterality: N/A;   VARICOCELECTOMY Right 06/10/2021   Procedure: right, neurolysis-subinguial;  Surgeon: Cleon Gustin, MD;  Location: [facility];  Service: Urology;  Laterality: Right;   VARICOCELECTOMY Right 09/02/2021   Procedure: VARICOCELECTOMY-right subingunial neurolysis;  Surgeon: Cleon Gustin, MD;  Location: [facility];  Service: Urology;  Laterality: Right;   VASECTOMY N/A 09/02/2021   Procedure: VASECTOMY;  Surgeon: Cleon Gustin, MD;  Location: [facility];  Service: Urology;  Laterality: N/A;    Home Medications:  Allergies as of 02/02/2022       Reactions   Benzoin Rash   Blisters   Freederm Adhesive Remover [new Skin] Itching   Meloxicam Hives   Oxycontin [oxycodone] Other (See Comments)   Headache        Medication List        Accurate as of February 02, 2022  2:31 PM. If you have any questions, ask your nurse or doctor.          ALOE VERA EX Apply 1 application topically daily as needed (sun burn).   ALPRAZolam 0.5 MG tablet Commonly known as: XANAX Take 0.5 mg by mouth daily as needed for anxiety.   aspirin EC 81 MG tablet Take 81 mg by mouth 3 (three) times a week. Swallow whole.   cetirizine 10 MG  tablet Commonly known as: ZYRTEC Take 10 mg by mouth daily.   eletriptan 40 MG tablet Commonly known as: RELPAX Take 40 mg by mouth daily as needed for migraine.   fluticasone 50 MCG/ACT nasal spray Commonly known as: FLONASE Place 1 spray into both nostrils daily.   HYDROcodone-acetaminophen 5-325 MG tablet Commonly known as: Norco Take 1 tablet by mouth every 6 (six) hours as needed for moderate pain.   ibuprofen 200 MG tablet Commonly known as: ADVIL Take 600 mg by mouth every 6 (six) hours as needed for headache.   multivitamin tablet Take 1 tablet by mouth daily.   naproxen 500 MG tablet Commonly known as: NAPROSYN Take 500 mg by  mouth daily as needed for migraine.   omeprazole 40 MG capsule Commonly known as: PRILOSEC Take 40 mg by mouth daily.   ondansetron 4 MG tablet Commonly known as: ZOFRAN Take 4 mg by mouth every 8 (eight) hours as needed (migraine related nausea).   testosterone cypionate 200 MG/ML injection Commonly known as: DEPOTESTOSTERONE CYPIONATE Inject 600 mg into the muscle every 14 (fourteen) days.   valACYclovir 1000 MG tablet Commonly known as: VALTREX Take 1,000 mg by mouth daily as needed (fever blisters).        Allergies:  Allergies  Allergen Reactions   Benzoin Rash    Blisters   Freederm Adhesive Remover [New Skin] Itching   Meloxicam Hives   Oxycontin [Oxycodone] Other (See Comments)    Headache    Family History: Family History  Problem Relation Age of Onset   Hypotension Neg Hx    Anesthesia problems Neg Hx    Malignant hyperthermia Neg Hx    Pseudochol deficiency Neg Hx     Social History:  reports that he has never smoked. He has quit using smokeless tobacco. He reports current alcohol use of about 1.0 standard drink per week. He reports that he does not use drugs.  ROS: All other review of systems were reviewed and are negative except what is noted above in HPI  Physical Exam: BP (!) 179/94    Pulse 77   Constitutional:  Alert and oriented, No acute distress. HEENT: [place] AT, moist mucus membranes.  Trachea midline, no masses. Cardiovascular: No clubbing, cyanosis, or edema. Respiratory: Normal respiratory effort, no increased work of breathing. GI: Abdomen is soft, nontender, nondistended, no abdominal masses GU: No CVA tenderness. Circumcised phallus. No masses/lesions on penis, testis, scrotum. Left testis nontender. Left spermatic cord mildly tender to palpation of left inguinal canal. Lymph: No cervical or inguinal lymphadenopathy. Skin: No rashes, bruises or suspicious lesions. Neurologic: Grossly intact, no focal deficits, moving all 4  extremities. Psychiatric: Normal mood and affect.  Laboratory Data: Lab Results  Component Value Date   WBC 6.3 08/31/2021   HGB 16.0 08/31/2021   HCT 47.9 08/31/2021   MCV 89.2 08/31/2021   PLT 150 08/31/2021    Lab Results  Component Value Date   CREATININE 1.32 (H) 08/31/2021    No results found for: PSA  No results found for: TESTOSTERONE  No results found for: HGBA1C  Urinalysis    Component Value Date/Time   APPEARANCEUR Clear 07/02/2021 1507   GLUCOSEU Negative 07/02/2021 1507   BILIRUBINUR Negative 07/02/2021 1507   PROTEINUR Negative 07/02/2021 1507   NITRITE Negative 07/02/2021 1507   LEUKOCYTESUR Negative 07/02/2021 1507    Lab Results  Component Value Date   LABMICR Comment 07/02/2021    Pertinent Imaging:  No results found for this or  any previous visit.  No results found for this or any previous visit.  No results found for this or any previous visit.  No results found for this or any previous visit.  No results found for this or any previous visit.  No results found for this or any previous visit.  No results found for this or any previous visit.  No results found for this or any previous visit.   Assessment & Plan:    1. Left inguinal pain -Scrotal US - Urinalysis, Routine w reflex microscopic   Return in about 4 weeks (around 03/02/2022) for scrotal US.  [name], MD  [facility] [place]

## 2022-02-14 ENCOUNTER — Other Ambulatory Visit: Payer: Self-pay

## 2022-02-14 ENCOUNTER — Ambulatory Visit (HOSPITAL_COMMUNITY)
Admission: RE | Admit: 2022-02-14 | Discharge: 2022-02-14 | Disposition: A | Discharge status: Home / Self Care | Payer: BC Managed Care – PPO | Source: Ambulatory Visit | Attending: Urology | Admitting: Urology

## 2022-02-14 DIAGNOSIS — R1032 Left lower quadrant pain: Secondary | ICD-10-CM | POA: Diagnosis not present

## 2022-03-07 ENCOUNTER — Ambulatory Visit (INDEPENDENT_AMBULATORY_CARE_PROVIDER_SITE_OTHER): Payer: BC Managed Care – PPO | Admitting: Urology

## 2022-03-07 ENCOUNTER — Other Ambulatory Visit: Payer: Self-pay

## 2022-03-07 VITALS — BP 159/96 | HR 64

## 2022-03-07 DIAGNOSIS — R1032 Left lower quadrant pain: Secondary | ICD-10-CM | POA: Diagnosis not present

## 2022-03-07 DIAGNOSIS — N50812 Left testicular pain: Secondary | ICD-10-CM | POA: Diagnosis not present

## 2022-03-07 LAB — URINALYSIS, ROUTINE W REFLEX MICROSCOPIC
Bilirubin, UA: NEGATIVE
Glucose, UA: NEGATIVE
Ketones, UA: NEGATIVE
Leukocytes,UA: NEGATIVE
Nitrite, UA: NEGATIVE
Protein,UA: NEGATIVE
RBC, UA: NEGATIVE
Specific Gravity, UA: >1.03 — ABNORMAL HIGH (ref 1.005–1.030)
Urobilinogen, Ur: 0.2 mg/dL (ref 0.2–1.0)
pH, UA: 7 (ref 5.0–7.5)

## 2022-03-07 MED ORDER — LIDOCAINE HCL 2 % IJ SOLN
5.0000 mL | Freq: Once | INTRAMUSCULAR | Status: AC
  Administered 2022-03-07: 100 mg

## 2022-03-07 MED ORDER — BUPIVACAINE HCL (PF) 0.5 % IJ SOLN
5.0000 mL | Freq: Once | INTRAMUSCULAR | Status: AC
  Administered 2022-03-07: 5 mL

## 2022-03-07 NOTE — Progress Notes (Signed)
? ?03/07/2022 ?9:38 AM  ? ?[name] ?[birth year] ?[id number] ? ?Referring provider: [name], MD ?[place] ?[place],  [place] ? ?Followup left orchalgia ? ? ?HPI: ?Chase Wyatt is a 51yo here for followup for left testis pain. Scrotal US showed no left testicular abnormalities. He continues to have sharp, intermittent, moderate left testis pain which is worse with activity. No other ttoday. He is requesting a cord block today ? ? ?PMH: ?Past Medical History:  ?Diagnosis Date  ? Anxiety   ? Arthritis   ? GERD (gastroesophageal reflux disease)   ? Heart murmur   ? Hypertension   ? Joint pain   ? Neuromuscular disorder ([place])   ? nerve damage right leg  ? PONV (postoperative nausea and vomiting)   ? Sleep apnea   ? ? ?Surgical History: ?Past Surgical History:  ?Procedure Laterality Date  ? ANKLE DEBRIDEMENT  3 yrs ago -[place]  ? left  ? ANKLE SURGERY  2005  ? 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 BACK SURGERY    ? multiple  ? CARDIAC CATHETERIZATION  2013  ? [place], no stents  ? CHOLECYSTECTOMY  10/28/2011  ? Procedure: LAPAROSCOPIC CHOLECYSTECTOMY;  Surgeon: Jamesetta Wyatt;  [facility]: [facility];  Service: General;  Laterality: N/A;  ? INGUINAL HERNIA REPAIR Left 08/07/2020  ? Procedure: HERNIA REPAIR INGUINAL ADULT using MESH MARLEX PLUG MEDIUM;  Surgeon: Chase Signs, MD;  [facility]: [facility];  Service: General;  Laterality: Left;  ? INGUINAL HERNIA REPAIR Right 01/11/2021  ? Procedure: HERNIA REPAIR INGUINAL ADULT;  Surgeon: Chase Signs, MD;  Location: [facility];  Service: General;  Laterality: Right;  ? LAMINECTOMY THORACIC SPINE W/ PLACEMENT SPINAL CORD STIMULATOR    ? SHOULDER OPEN ROTATOR CUFF REPAIR Left   ? [place]  ? UMBILICAL HERNIA REPAIR N/A 08/07/2020  ? Procedure: HERNIA REPAIR UMBILICAL ADULT;  Surgeon: Chase Signs, MD;  Location: [facility];  Service: General;  Laterality: N/A;  ? VARICOCELECTOMY Right 06/10/2021  ? Procedure: right, neurolysis-subinguial;  Surgeon: Chase Gustin, MD;  Location: [facility];  Service: Urology;  Laterality: Right;  ? VARICOCELECTOMY Right 09/02/2021  ? Procedure: VARICOCELECTOMY-right subingunial neurolysis;  Surgeon: Chase Gustin, MD;  Location: [facility];  Service: Urology;  Laterality: Right;  ? VASECTOMY N/A 09/02/2021  ? Procedure: VASECTOMY;  Surgeon: Chase Gustin, MD;  Location: [facility];  Service: Urology;  Laterality: N/A;  ? ? ?Home Medications:  ?Allergies as of 03/07/2022   ? ?   Reactions  ? Benzoin Rash  ? Blisters  ? Freederm Adhesive Remover [new Skin] Itching  ? Meloxicam Hives  ? Oxycontin [oxycodone] Other (See Comments)  ? Headache  ? ?  ? ?  ?Medication List  ?  ? ?  ? Accurate as of March 07, 2022  9:38 AM. If you have any questions, ask your nurse or doctor.  ?  ?  ? ?  ? ?ALOE VERA EX ?Apply 1 application topically daily as needed (sun burn). ?  ?ALPRAZolam 0.5 MG tablet ?Commonly known as: Duanne Moron ?Take 0.5 mg by mouth daily as needed for anxiety. ?  ?aspirin EC 81 MG tablet ?Take 81 mg by mouth 3 (three) times a week. Swallow whole. ?  ?cetirizine 10 MG tablet ?Commonly known as: ZYRTEC ?Take 10 mg by mouth daily. ?  ?eletriptan 40 MG tablet ?Commonly known as: RELPAX ?Take 40 mg by mouth daily as needed for migraine. ?  ?fluconazole 150 MG tablet ?  Commonly known as: DIFLUCAN ?Take 150 mg by mouth every other day. ?  ?fluticasone 50 MCG/ACT nasal spray ?Commonly known as: FLONASE ?Place 1 spray into both nostrils daily. ?  ?HYDROcodone-acetaminophen 5-325 MG tablet ?Commonly known as: Norco ?Take 1 tablet by mouth every 6 (six) hours as needed for moderate pain. ?  ?ibuprofen 200 MG tablet ?Commonly known as: ADVIL ?Take 600 mg by mouth every 6 (six) hours as needed for headache. ?  ?multivitamin tablet ?Take 1 tablet by mouth daily. ?  ?naproxen 500 MG tablet ?Commonly known as: NAPROSYN ?Take 500 mg by mouth daily as needed for migraine. ?  ?omeprazole 40 MG capsule ?Commonly known as: PRILOSEC ?Take 40 mg by mouth daily. ?   ?ondansetron 4 MG tablet ?Commonly known as: ZOFRAN ?Take 4 mg by mouth every 8 (eight) hours as needed (migraine related nausea). ?  ?tamsulosin 0.4 MG Caps capsule ?Commonly known as: FLOMAX ?Take 0.4 mg by mouth at bedtime. ?  ?testosterone cypionate 200 MG/ML injection ?Commonly known as: DEPOTESTOSTERONE CYPIONATE ?Inject 600 mg into the muscle every 14 (fourteen) days. ?  ?valACYclovir 1000 MG tablet ?Commonly known as: VALTREX ?Take 1,000 mg by mouth daily as needed (fever blisters). ?  ?zolpidem 10 MG tablet ?Commonly known as: AMBIEN ?Take 10 mg by mouth at bedtime. ?  ? ?  ? ? ?Allergies:  ?Allergies  ?Allergen Reactions  ? Benzoin Rash  ?  Blisters  ? Freederm Adhesive Remover [New Skin] Itching  ? Meloxicam Hives  ? Oxycontin [Oxycodone] Other (See Comments)  ?  Headache  ? ? ?Family History: ?Family History  ?Problem Relation Age of Onset  ? Hypotension Neg Hx   ? Anesthesia problems Neg Hx   ? Malignant hyperthermia Neg Hx   ? Pseudochol deficiency Neg Hx   ? ? ?Social History:  reports that he has never smoked. He has quit using smokeless tobacco. He reports current alcohol use of about 1.0 standard drink per week. He reports that he does not use drugs. ? ?ROS: ?All other review of systems were reviewed and are negative except what is noted above in HPI ? ?Physical Exam: ?BP (!) 159/96   Pulse 64   ?Constitutional:  Alert and oriented, No acute distress. ?HEENT: Chase Wyatt AT, moist mucus membranes.  Trachea midline, no masses. ?Cardiovascular: No clubbing, cyanosis, or edema. ?Respiratory: Normal respiratory effort, no increased work of breathing. ?GI: Abdomen is soft, nontender, nondistended, no abdominal masses ?GU: No CVA tenderness.  ?Lymph: No cervical or inguinal lymphadenopathy. ?Skin: No rashes, bruises or suspicious lesions. ?Neurologic: Grossly intact, no focal deficits, moving all 4 extremities. ?Psychiatric: Normal mood and affect. ? ?Laboratory Data: ?Lab Results  ?Component Value Date  ? WBC  6.3 08/31/2021  ? HGB 16.0 08/31/2021  ? HCT 47.9 08/31/2021  ? MCV 89.2 08/31/2021  ? PLT 150 08/31/2021  ? ? ?Lab Results  ?Component Value Date  ? CREATININE 1.32 (H) 08/31/2021  ? ? ?No results found for: PSA ? ?No results found for: TESTOSTERONE ? ?No results found for: HGBA1C ? ?Urinalysis ?   ?Component Value Date/Time  ? APPEARANCEUR Clear 07/02/2021 1507  ? GLUCOSEU Negative 07/02/2021 1507  ? BILIRUBINUR Negative 07/02/2021 1507  ? PROTEINUR Negative 07/02/2021 1507  ? NITRITE Negative 07/02/2021 1507  ? LEUKOCYTESUR Negative 07/02/2021 1507  ? ? ?Lab Results  ?Component Value Date  ? LABMICR Comment 07/02/2021  ? ? ?Pertinent Imaging: ?Scrotal US 02/14/2022: Images reviewed and discussed with the patient ?No results found  for this or any previous visit. ? ?No results found for this or any previous visit. ? ?No results found for this or any previous visit. ? ?No results found for this or any previous visit. ? ?No results found for this or any previous visit. ? ?No results found for this or any previous visit. ? ?No results found for this or any previous visit. ? ?No results found for this or any previous visit. ? ?Cord block procedure ?  ?The patients left inguinal region was prepped with betadine. We then proceeded to inject 17m of a half 0.5% bupivicaine and half 2% lidocaine on the floor of the right spermatic cord and then an additional 5104mof the same solution at the top of the cord. The patient tolerated the procedure well  ? ?Assessment & Plan:   ? ?1. Left inguinal pain ?-Cord block performed today. If this improves his pain we will proceed with left testicular neurolysis ?- Urinalysis, Routine w reflex microscopic ? ? ?No follow-ups on file. ? ?PaNicolette BangMD ? ?[facility] [place]  ?

## 2022-03-08 ENCOUNTER — Telehealth: Payer: Self-pay

## 2022-03-08 NOTE — Telephone Encounter (Signed)
Patient called office today and wishes to proceed with surgery.  ? ?Message sent to MD ?

## 2022-03-11 NOTE — Telephone Encounter (Signed)
Patient is asking if you will do the same procedure as you did on the right side. Patient wants to make sure you go lower on left like you did the right side.  ?

## 2022-03-14 ENCOUNTER — Encounter: Payer: Self-pay | Admitting: Urology

## 2022-03-14 NOTE — Patient Instructions (Signed)
Varicocele ?A varicocele is a swelling of veins in the scrotum. The scrotum is the sac that contains the testicles. Varicoceles can occur on either side of the scrotum, but they are more common on the left side. They occur most often in teenage boys and young men. ?In most cases, varicoceles are not a serious problem. They are usually small and painless and do not require treatment. Tests may be done to confirm the diagnosis. Treatment may be needed if: ?A varicocele is large, causes a lot of pain, or causes pain when exercising. ?Varicoceles are found on both sides of the scrotum. ?A varicocele causes a decrease in the size of the testicle in a growing adolescent. ?The person has fertility problems. ?What are the causes? ?This condition is the result of valves in the veins not working properly. Valves in the veins help to return blood from the scrotum and testicles to the heart. If these valves do not work well, blood flows backward and backs up into the veins, which causes the veins to swell. This is similar to what happens when varicose veins form in the leg. ?What are the signs or symptoms? ?Most varicoceles do not cause any symptoms. If symptoms do occur, they may include: ?Swelling on one side of the scrotum. The swelling may be more obvious when you are standing up. ?A lumpy feeling in the scrotum. ?A heavy feeling on one side of the scrotum. ?A dull ache in the scrotum, especially after exercise or prolonged standing or sitting. ?Slower growth or reduced size of the testicle on the side of the varicocele (in young males). ?Problems with fathering a child (fertility). This can occur if the testicle does not grow normally or if the condition causes problems with the sperm, such as a low sperm count or sperm that are not able to reach the egg (poor motility). ?How is this diagnosed? ?This condition is diagnosed based on: ?Your medical history. ?A physical exam. Your health care provider may inspect and feel  (palpate) the scrotal area to check for swollen or enlarged veins. ?An ultrasound. This may be done to confirm the diagnosis and to help rule out other causes of the swelling. ?How is this treated? ?Treatment is usually not needed for this condition. If you have any pain, your health care provider may prescribe or recommend medicine to help relieve it. You may need regular exams so your health care provider can monitor the varicocele to ensure that it does not cause problems. ?When further treatment is needed, it may involve one of these options: ?Varicocelectomy. This is a surgery in which the swollen veins are tied off so that the flow of blood goes to other veins instead. ?Embolization. In this procedure, a small, thin tube (catheter) is used to place metal coils or other blocking items in the veins. This cuts off the blood flow to the swollen veins. ?Follow these instructions at home: ?Take over-the-counter and prescription medicines only as told by your health care provider. ?Wear supportive underwear. ?Use an athletic supporter when participating in sports activities. ?Keep all follow-up visits as told by your health care provider. This is important. ?Contact a health care provider if: ?Your pain is increasing. ?You have redness in the affected area. ?Your testicle becomes enlarged, swollen, or painful. ?You have swelling that does not decrease when you are lying down. ?One of your testicles is smaller than the other. ?Get help right away if: ?You develop swelling in your legs. ?You have difficulty breathing. ?Summary ?  Varicocele is a condition in which the veins in the scrotum are swollen or enlarged. ?In most cases, varicoceles do not require treatment. ?Treatment may be needed if you have pain, have problems with infertility, or have a smaller testicle associated with the varicocele. ?In some cases, the condition may be treated with a procedure to cut off the flow of blood to the swollen veins. ?This  information is not intended to replace advice given to you by your health care provider. Make sure you discuss any questions you have with your health care provider. ?Document Revised: 02/22/2019 Document Reviewed: 02/22/2019 ?Elsevier Patient Education ? [place]. ? ?

## 2022-03-23 ENCOUNTER — Encounter: Payer: Self-pay | Admitting: Urology

## 2022-03-23 ENCOUNTER — Ambulatory Visit (INDEPENDENT_AMBULATORY_CARE_PROVIDER_SITE_OTHER): Payer: BC Managed Care – PPO | Admitting: Urology

## 2022-03-23 VITALS — BP 179/84 | HR 62

## 2022-03-23 DIAGNOSIS — R1032 Left lower quadrant pain: Secondary | ICD-10-CM | POA: Diagnosis not present

## 2022-03-23 DIAGNOSIS — N410 Acute prostatitis: Secondary | ICD-10-CM

## 2022-03-23 LAB — URINALYSIS, ROUTINE W REFLEX MICROSCOPIC
Bilirubin, UA: NEGATIVE
Glucose, UA: NEGATIVE
Ketones, UA: NEGATIVE
Leukocytes,UA: NEGATIVE
Nitrite, UA: NEGATIVE
Protein,UA: NEGATIVE
RBC, UA: NEGATIVE
Specific Gravity, UA: 1.015 (ref 1.005–1.030)
Urobilinogen, Ur: 0.2 mg/dL (ref 0.2–1.0)
pH, UA: 7 (ref 5.0–7.5)

## 2022-03-23 NOTE — H&P (View-Only) (Signed)
? ?03/23/2022 ?8:51 AM  ? ?Chase Wyatt ?[birth year] ?[id number] ? ?Referring provider: [name], MD ?[place] ?[place],  [place] ? ?Followup prostatitis ? ? ?HPI: ?[name] is a 51yo here for followup after prostatitis. He developed urinary frequency, urgency and dysuria. He was diagnosed with prostatitis and started on cipro. His symptoms resolved. He is currently on flomax which has improved his LUTS. No other complaints today.  ? ? ?PMH: ?Past Medical History:  ?Diagnosis Date  ? Anxiety   ? Arthritis   ? GERD (gastroesophageal reflux disease)   ? Heart murmur   ? Hypertension   ? Joint pain   ? Neuromuscular disorder ([place])   ? nerve damage right leg  ? PONV (postoperative nausea and vomiting)   ? Sleep apnea   ? ? ?Surgical History: ?Past Surgical History:  ?Procedure Laterality Date  ? ANKLE DEBRIDEMENT  3 yrs ago -[place]  ? left  ? ANKLE SURGERY  2005  ? 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 BACK SURGERY    ? multiple  ? CARDIAC CATHETERIZATION  2013  ? [place], no stents  ? CHOLECYSTECTOMY  10/28/2011  ? Procedure: LAPAROSCOPIC CHOLECYSTECTOMY;  Surgeon: Chase Wyatt;  Location: [facility];  Service: General;  Laterality: N/A;  ? INGUINAL HERNIA REPAIR Left 08/07/2020  ? Procedure: HERNIA REPAIR INGUINAL ADULT using MESH MARLEX PLUG MEDIUM;  Surgeon: Chase Signs, MD;  Location: [facility];  Service: General;  Laterality: Left;  ? INGUINAL HERNIA REPAIR Right 01/11/2021  ? Procedure: HERNIA REPAIR INGUINAL ADULT;  Surgeon: Chase Signs, MD;  Location: [facility];  Service: General;  Laterality: Right;  ? LAMINECTOMY THORACIC SPINE W/ PLACEMENT SPINAL CORD STIMULATOR    ? SHOULDER OPEN ROTATOR CUFF REPAIR Left   ? [place]  ? UMBILICAL HERNIA REPAIR N/A 08/07/2020  ? Procedure: HERNIA REPAIR UMBILICAL ADULT;  Surgeon: Chase Signs, MD;  Location: [facility];  Service: General;  Laterality: N/A;  ? VARICOCELECTOMY Right 06/10/2021  ? Procedure: right, neurolysis-subinguial;  Surgeon: Chase Gustin, MD;  Location: [facility];  Service: Urology;  Laterality: Right;  ? VARICOCELECTOMY Right 09/02/2021  ? Procedure: VARICOCELECTOMY-right subingunial neurolysis;  Surgeon: Chase Gustin, MD;  [facility]: [facility];  Service: Urology;  Laterality: Right;  ? VASECTOMY N/A 09/02/2021  ? Procedure: VASECTOMY;  Surgeon: Chase Gustin, MD;  Location: [facility];  Service: Urology;  Laterality: N/A;  ? ? ?Home Medications:  ?Allergies as of 03/23/2022   ? ?   Reactions  ? Benzoin Rash  ? Blisters  ? Freederm Adhesive Remover [new Skin] Itching  ? Meloxicam Hives  ? Oxycontin [oxycodone] Other (See Comments)  ? Headache  ? ?  ? ?  ?Medication List  ?  ? ?  ? Accurate as of March 23, 2022  8:51 AM. If you have any questions, ask your nurse or doctor.  ?  ?  ? ?  ? ?ALOE VERA EX ?Apply 1 application topically daily as needed (sun burn). ?  ?ALPRAZolam 0.5 MG tablet ?Commonly known as: Chase Wyatt ?Take 0.5 mg by mouth daily as needed for anxiety. ?  ?aspirin EC 81 MG tablet ?Take 81 mg by mouth 3 (three) times a week. Swallow whole. ?  ?cetirizine 10 MG tablet ?Commonly known as: ZYRTEC ?Take 10 mg by mouth daily. ?  ?eletriptan 40 MG tablet ?Commonly known as: RELPAX ?Take 40 mg by mouth daily as needed for migraine. ?  ?fluconazole 150 MG tablet ?Commonly  known as: DIFLUCAN ?Take 150 mg by mouth every other day. ?  ?fluticasone 50 MCG/ACT nasal spray ?Commonly known as: FLONASE ?Place 1 spray into both nostrils daily. ?  ?HYDROcodone-acetaminophen 5-325 MG tablet ?Commonly known as: Norco ?Take 1 tablet by mouth every 6 (six) hours as needed for moderate pain. ?  ?ibuprofen 200 MG tablet ?Commonly known as: ADVIL ?Take 600 mg by mouth every 6 (six) hours as needed for headache. ?  ?multivitamin tablet ?Take 1 tablet by mouth daily. ?  ?naproxen 500 MG tablet ?Commonly known as: NAPROSYN ?Take 500 mg by mouth daily as needed for migraine. ?  ?omeprazole 40 MG capsule ?Commonly known as: PRILOSEC ?Take 40 mg by mouth daily. ?   ?ondansetron 4 MG tablet ?Commonly known as: ZOFRAN ?Take 4 mg by mouth every 8 (eight) hours as needed (migraine related nausea). ?  ?tamsulosin 0.4 MG Caps capsule ?Commonly known as: FLOMAX ?Take 0.4 mg by mouth at bedtime. ?  ?testosterone cypionate 200 MG/ML injection ?Commonly known as: DEPOTESTOSTERONE CYPIONATE ?Inject 600 mg into the muscle every 14 (fourteen) days. ?  ?valACYclovir 1000 MG tablet ?Commonly known as: VALTREX ?Take 1,000 mg by mouth daily as needed (fever blisters). ?  ?zolpidem 10 MG tablet ?Commonly known as: AMBIEN ?Take 10 mg by mouth at bedtime. ?  ? ?  ? ? ?Allergies:  ?Allergies  ?Allergen Reactions  ? Benzoin Rash  ?  Blisters  ? Freederm Adhesive Remover [New Skin] Itching  ? Meloxicam Hives  ? Oxycontin [Oxycodone] Other (See Comments)  ?  Headache  ? ? ?Family History: ?Family History  ?Problem Relation Age of Onset  ? Hypotension Neg Hx   ? Anesthesia problems Neg Hx   ? Malignant hyperthermia Neg Hx   ? Pseudochol deficiency Neg Hx   ? ? ?Social History:  reports that he has never smoked. He has quit using smokeless tobacco. He reports current alcohol use of about 1.0 standard drink per week. He reports that he does not use drugs. ? ?ROS: ?All other review of systems were reviewed and are negative except what is noted above in HPI ? ?Physical Exam: ?BP (!) 179/84   Pulse 62   ?Constitutional:  Alert and oriented, No acute distress. ?HEENT: [place] AT, moist mucus membranes.  Trachea midline, no masses. ?Cardiovascular: No clubbing, cyanosis, or edema. ?Respiratory: Normal respiratory effort, no increased work of breathing. ?GI: Abdomen is soft, nontender, nondistended, no abdominal masses ?GU: No CVA tenderness.  ?Lymph: No cervical or inguinal lymphadenopathy. ?Skin: No rashes, bruises or suspicious lesions. ?Neurologic: Grossly intact, no focal deficits, moving all 4 extremities. ?Psychiatric: Normal mood and affect. ? ?Laboratory Data: ?Lab Results  ?Component Value Date  ? WBC  6.3 08/31/2021  ? HGB 16.0 08/31/2021  ? HCT 47.9 08/31/2021  ? MCV 89.2 08/31/2021  ? PLT 150 08/31/2021  ? ? ?Lab Results  ?Component Value Date  ? CREATININE 1.32 (H) 08/31/2021  ? ? ?No results found for: PSA ? ?No results found for: TESTOSTERONE ? ?No results found for: HGBA1C ? ?Urinalysis ?   ?Component Value Date/Time  ? APPEARANCEUR Clear 03/07/2022 0938  ? GLUCOSEU Negative 03/07/2022 0938  ? BILIRUBINUR Negative 03/07/2022 0938  ? PROTEINUR Negative 03/07/2022 0938  ? NITRITE Negative 03/07/2022 0938  ? LEUKOCYTESUR Negative 03/07/2022 1245  ? ? ?Lab Results  ?Component Value Date  ? LABMICR Comment 03/07/2022  ? ? ?Pertinent Imaging: ? ?No results found for this or any previous visit. ? ?No results found  for this or any previous visit. ? ?No results found for this or any previous visit. ? ?No results found for this or any previous visit. ? ?No results found for this or any previous visit. ? ?No results found for this or any previous visit. ? ?No results found for this or any previous visit. ? ?No results found for this or any previous visit. ? ? ?Assessment & Plan:   ? ?1. Prostatitis ?-Symtpoms resolved and patient is urinating well currently. He is to continue flomax for the next 30 days.  ?- Urinalysis, Routine w reflex microscopic ? ? ?No follow-ups on file. ? ?[name], MD ? ?[facility] ?  ?

## 2022-03-23 NOTE — Patient Instructions (Signed)

## 2022-03-23 NOTE — Progress Notes (Signed)
? ?03/23/2022 ?8:51 AM  ? ?Chase Wyatt ?16-Aug-1971 ?[id number] ? ?Referring provider: [name], MD ?[place] ?[place],  [place] ? ?Followup prostatitis ? ? ?HPI: ?[name] is a 51yo here for followup after prostatitis. He developed urinary frequency, urgency and dysuria. He was diagnosed with prostatitis and started on cipro. His symptoms resolved. He is currently on flomax which has improved his LUTS. No other complaints today.  ? ? ?PMH: ?Past Medical History:  ?Diagnosis Date  ? Anxiety   ? Arthritis   ? GERD (gastroesophageal reflux disease)   ? Heart murmur   ? Hypertension   ? Joint pain   ? Neuromuscular disorder ([place])   ? nerve damage right leg  ? PONV (postoperative nausea and vomiting)   ? Sleep apnea   ? ? ?Surgical History: ?Past Surgical History:  ?Procedure Laterality Date  ? ANKLE DEBRIDEMENT  3 yrs ago -[place]  ? left  ? ANKLE SURGERY  2005  ? 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 BACK SURGERY    ? multiple  ? CARDIAC CATHETERIZATION  2013  ? [place], no stents  ? CHOLECYSTECTOMY  10/28/2011  ? Procedure: LAPAROSCOPIC CHOLECYSTECTOMY;  Surgeon: Jamesetta So;  Location: [facility];  Service: General;  Laterality: N/A;  ? INGUINAL HERNIA REPAIR Left 08/07/2020  ? Procedure: HERNIA REPAIR INGUINAL ADULT using MESH MARLEX PLUG MEDIUM;  Surgeon: Aviva Signs, MD;  Location: [facility];  Service: General;  Laterality: Left;  ? INGUINAL HERNIA REPAIR Right 01/11/2021  ? Procedure: HERNIA REPAIR INGUINAL ADULT;  Surgeon: Aviva Signs, MD;  Location: [facility];  Service: General;  Laterality: Right;  ? LAMINECTOMY THORACIC SPINE W/ PLACEMENT SPINAL CORD STIMULATOR    ? SHOULDER OPEN ROTATOR CUFF REPAIR Left   ? [place]  ? UMBILICAL HERNIA REPAIR N/A 08/07/2020  ? Procedure: HERNIA REPAIR UMBILICAL ADULT;  Surgeon: Aviva Signs, MD;  Location: [facility];  Service: General;  Laterality: N/A;  ? VARICOCELECTOMY Right 06/10/2021  ? Procedure: right, neurolysis-subinguial;  Surgeon: Cleon Gustin, MD;  Location: [facility];  Service: Urology;  Laterality: Right;  ? VARICOCELECTOMY Right 09/02/2021  ? Procedure: VARICOCELECTOMY-right subingunial neurolysis;  Surgeon: Cleon Gustin, MD;  Location: [facility];  Service: Urology;  Laterality: Right;  ? VASECTOMY N/A 09/02/2021  ? Procedure: VASECTOMY;  Surgeon: Cleon Gustin, MD;  Location: [facility];  Service: Urology;  Laterality: N/A;  ? ? ?Home Medications:  ?Allergies as of 03/23/2022   ? ?   Reactions  ? Benzoin Rash  ? Blisters  ? Freederm Adhesive Remover [new Skin] Itching  ? Meloxicam Hives  ? Oxycontin [oxycodone] Other (See Comments)  ? Headache  ? ?  ? ?  ?Medication List  ?  ? ?  ? Accurate as of March 23, 2022  8:51 AM. If you have any questions, ask your nurse or doctor.  ?  ?  ? ?  ? ?ALOE VERA EX ?Apply 1 application topically daily as needed (sun burn). ?  ?ALPRAZolam 0.5 MG tablet ?Commonly known as: Duanne Moron ?Take 0.5 mg by mouth daily as needed for anxiety. ?  ?aspirin EC 81 MG tablet ?Take 81 mg by mouth 3 (three) times a week. Swallow whole. ?  ?cetirizine 10 MG tablet ?Commonly known as: ZYRTEC ?Take 10 mg by mouth daily. ?  ?eletriptan 40 MG tablet ?Commonly known as: RELPAX ?Take 40 mg by mouth daily as needed for migraine. ?  ?fluconazole 150 MG tablet ?Commonly  known as: DIFLUCAN ?Take 150 mg by mouth every other day. ?  ?fluticasone 50 MCG/ACT nasal spray ?Commonly known as: FLONASE ?Place 1 spray into both nostrils daily. ?  ?HYDROcodone-acetaminophen 5-325 MG tablet ?Commonly known as: Norco ?Take 1 tablet by mouth every 6 (six) hours as needed for moderate pain. ?  ?ibuprofen 200 MG tablet ?Commonly known as: ADVIL ?Take 600 mg by mouth every 6 (six) hours as needed for headache. ?  ?multivitamin tablet ?Take 1 tablet by mouth daily. ?  ?naproxen 500 MG tablet ?Commonly known as: NAPROSYN ?Take 500 mg by mouth daily as needed for migraine. ?  ?omeprazole 40 MG capsule ?Commonly known as: PRILOSEC ?Take 40 mg by mouth daily. ?   ?ondansetron 4 MG tablet ?Commonly known as: ZOFRAN ?Take 4 mg by mouth every 8 (eight) hours as needed (migraine related nausea). ?  ?tamsulosin 0.4 MG Caps capsule ?Commonly known as: FLOMAX ?Take 0.4 mg by mouth at bedtime. ?  ?testosterone cypionate 200 MG/ML injection ?Commonly known as: DEPOTESTOSTERONE CYPIONATE ?Inject 600 mg into the muscle every 14 (fourteen) days. ?  ?valACYclovir 1000 MG tablet ?Commonly known as: VALTREX ?Take 1,000 mg by mouth daily as needed (fever blisters). ?  ?zolpidem 10 MG tablet ?Commonly known as: AMBIEN ?Take 10 mg by mouth at bedtime. ?  ? ?  ? ? ?Allergies:  ?Allergies  ?Allergen Reactions  ? Benzoin Rash  ?  Blisters  ? Freederm Adhesive Remover [New Skin] Itching  ? Meloxicam Hives  ? Oxycontin [Oxycodone] Other (See Comments)  ?  Headache  ? ? ?Family History: ?Family History  ?Problem Relation Age of Onset  ? Hypotension Neg Hx   ? Anesthesia problems Neg Hx   ? Malignant hyperthermia Neg Hx   ? Pseudochol deficiency Neg Hx   ? ? ?Social History:  reports that he has never smoked. He has quit using smokeless tobacco. He reports current alcohol use of about 1.0 standard drink per week. He reports that he does not use drugs. ? ?ROS: ?All other review of systems were reviewed and are negative except what is noted above in HPI ? ?Physical Exam: ?BP (!) 179/84   Pulse 62   ?Constitutional:  Alert and oriented, No acute distress. ?HEENT: [place] AT, moist mucus membranes.  Trachea midline, no masses. ?Cardiovascular: No clubbing, cyanosis, or edema. ?Respiratory: Normal respiratory effort, no increased work of breathing. ?GI: Abdomen is soft, nontender, nondistended, no abdominal masses ?GU: No CVA tenderness.  ?Lymph: No cervical or inguinal lymphadenopathy. ?Skin: No rashes, bruises or suspicious lesions. ?Neurologic: Grossly intact, no focal deficits, moving all 4 extremities. ?Psychiatric: Normal mood and affect. ? ?Laboratory Data: ?Lab Results  ?Component Value Date  ? WBC  6.3 08/31/2021  ? HGB 16.0 08/31/2021  ? HCT 47.9 08/31/2021  ? MCV 89.2 08/31/2021  ? PLT 150 08/31/2021  ? ? ?Lab Results  ?Component Value Date  ? CREATININE 1.32 (H) 08/31/2021  ? ? ?No results found for: PSA ? ?No results found for: TESTOSTERONE ? ?No results found for: HGBA1C ? ?Urinalysis ?   ?Component Value Date/Time  ? APPEARANCEUR Clear 03/07/2022 0938  ? GLUCOSEU Negative 03/07/2022 0938  ? BILIRUBINUR Negative 03/07/2022 0938  ? PROTEINUR Negative 03/07/2022 0938  ? NITRITE Negative 03/07/2022 0938  ? LEUKOCYTESUR Negative 03/07/2022 1610  ? ? ?Lab Results  ?Component Value Date  ? LABMICR Comment 03/07/2022  ? ? ?Pertinent Imaging: ? ?No results found for this or any previous visit. ? ?No results found  for this or any previous visit. ? ?No results found for this or any previous visit. ? ?No results found for this or any previous visit. ? ?No results found for this or any previous visit. ? ?No results found for this or any previous visit. ? ?No results found for this or any previous visit. ? ?No results found for this or any previous visit. ? ? ?Assessment & Plan:   ? ?1. Prostatitis ?-Symtpoms resolved and patient is urinating well currently. He is to continue flomax for the next 30 days.  ?- Urinalysis, Routine w reflex microscopic ? ? ?No follow-ups on file. ? ?[name], MD ? ?[facility] ?  ?

## 2022-04-08 NOTE — Patient Instructions (Signed)
? ? ? ? ? ? Chase Wyatt ? 04/08/2022  ?  ? '@PREFPERIOPPHARMACY'$ @ ? ? Your procedure is scheduled on  04/14/2022. ? ? Report to [name] at  0830  A.M. ? ? Call this number if you have problems the morning of surgery: ? [phone] ? ? Remember: ? Do not eat or drink after midnight. ?  ?  ? Take these medicines the morning of surgery with A SIP OF WATER  ? ?       xanax(if needed(, zyrtec, relpax(if needed), prilosec, zofran (if needed). ?  ? ? Do not wear jewelry, make-up or nail polish. ? Do not wear lotions, powders, or perfumes, or deodorant. ? Do not shave 48 hours prior to surgery.  Men may shave face and neck. ? Do not bring valuables to the hospital. ? [facility] is not responsible for any belongings or valuables. ? ?Contacts, dentures or bridgework may not be worn into surgery.  Leave your suitcase in the car.  After surgery it may be brought to your room. ? ?For patients admitted to the hospital, discharge time will be determined by your treatment team. ? ?Patients discharged the day of surgery will not be allowed to drive home and must have someone with them for 24 hours.  ? ? ?Special instructions:   DO NOT smoke tobacco or vape for 24 hours before your procedure. ? ?Please read over the following fact sheets that you were given. ?Coughing and Deep Breathing, Surgical Site Infection Prevention, Anesthesia Post-op Instructions, and Care and Recovery After Surgery ?  ? ? ?  ?Incision Care, Adult ?An incision is a cut that a doctor makes in your skin for surgery. Most times, these cuts are closed after surgery. Your cut from surgery may be closed with: ?Stitches (sutures). ?Staples. ?Skin glue. ?Skin tape (adhesive) strips. ?You may need to go back to your doctor to have stitches or staples taken out. This may happen many days or many weeks after your surgery. You need to take good care of your cut so it does not get infected. Follow instructions from your doctor about how to care for your  cut. ?Supplies needed: ?Soap and water. ?A clean hand towel. ?Wound cleanser. ?A clean bandage (dressing), if needed. ?Cream or ointment, if told by your doctor. ?Clean gauze. ?How to care for your cut from surgery ?Cleaning your cut ?Ask your doctor how to clean your cut. You may need to: ?Wear medical gloves. ?Use mild soap and water, or a wound cleanser. ?Use a clean gauze to pat your cut dry after you clean it. ?Changing your bandage ?Wash your hands with soap and water for at least 20 seconds before and after you change your bandage. If you cannot use soap and water, use hand sanitizer. ?Do not usedisinfectants or antiseptics, such as rubbing alcohol, to clean your wound unless told by your doctor. ?Change your bandage as told by your doctor. ?Leavestitches or skin glue in place for at least 2 weeks. ?Leave tape strips alone unless you are told to take them off. You may trim the edges of the tape strips if they curl up. ?Put a cream or ointment on your cut. Do this only as told. ?Cover your cut with a clean bandage. ?Ask your doctor when you can leave your cut uncovered. ?Checking for infection ?Check your cut area every day for signs of infection. Check for: ?More redness, swelling, or pain. ?More fluid or blood. ?New warmth. ?Hardness or a  new rash around the incision. ?Pus or a bad smell. ? ?Follow these instructions at home ?Medicines ?Take over-the-counter and prescription medicines only as told by your doctor. ?If you were prescribed an antibiotic medicine, cream, or ointment, use it as told by your doctor. Do not stop using the antibiotic even if you start to feel better. ?Eating and drinking ?Eat foods that have a lot of certain nutrients, such as protein, vitamin A, and vitamin C. These foods help your cut heal. ?Foods rich in protein include meat, fish, eggs, dairy, beans, nuts, and protein drinks. ?Foods rich in vitamin A include carrots and dark green, leafy vegetables. ?Foods rich in vitamin C  include citrus fruits, tomatoes, broccoli, and peppers. ?Drink enough fluid to keep your pee (urine) pale yellow. ?General instructions ? ?Do not take baths, swim, or use a hot tub. Ask your doctor about taking showers or sponge baths. ?Limit movement around your cut. This helps with healing. ?Try not to strain, lift, or exercise for the first 2 weeks, or for as long as told by your doctor. ?Return to your normal activities as told by your doctor. Ask your doctor what activities are safe for you. ?Do not scratch, scrub, or pick at your cut. Keep it covered as told by your doctor. ?Protect your cut from the sun when you are outside for the first 6 months, or for as long as told by your doctor. Cover up the scar area or put on sunscreen that has an SPF of at least 30. ?Do not use any products that contain nicotine or tobacco, such as cigarettes, e-cigarettes, and chewing tobacco. These can delay cut healing. If you need help quitting, ask your doctor. ?Keep all follow-up visits. ?Contact a doctor if: ?You have any of these signs of infection around your cut: ?More redness, swelling, or pain. ?More fluid or blood. ?New warmth or hardness. ?Pus or a bad smell. ?A new rash. ?You have a fever. ?You feel like you may vomit (nauseous). ?You vomit. ?You are dizzy. ?Your stitches, staples, skin glue, or tape strips come undone. ?Your cut gets bigger. ?You have a fever. ?Get help right away if: ?Your cut bleeds through your bandage, and bleeding does not stop with gentle pressure. ?Your cut opens up and comes apart. ?These symptoms may be an emergency. Do not wait to see if the symptoms will go away. Get medical help right away. Call your local emergency services (911 in the [place] Do not drive yourself to the hospital. ?Summary ?Follow instructions from your doctor about how to care for your cut. ?Wash your hands with soap and water for at least 20 seconds before and after you change your bandage. If you cannot use soap and  water, use hand sanitizer. ?Check your cut area every day for signs of infection. ?Keep all follow-up visits. ?This information is not intended to replace advice given to you by your health care provider. Make sure you discuss any questions you have with your health care provider. ?Document Revised: 03/08/2021 Document Reviewed: 03/08/2021 ?Elsevier Patient Education ? [place]. ?General Anesthesia, Adult, Care After ?This sheet gives you information about how to care for yourself after your procedure. Your health care provider may also give you more specific instructions. If you have problems or questions, contact your health care provider. ?What can I expect after the procedure? ?After the procedure, the following side effects are common: ?Pain or discomfort at the IV site. ?Nausea. ?Vomiting. ?Sore throat. ?Trouble concentrating. ?Feeling  cold or chills. ?Feeling weak or tired. ?Sleepiness and fatigue. ?Soreness and body aches. These side effects can affect parts of the body that were not involved in surgery. ?Follow these instructions at home: ?For the time period you were told by your health care provider: ? ?Rest. ?Do not participate in activities where you could fall or become injured. ?Do not drive or use machinery. ?Do not drink alcohol. ?Do not take sleeping pills or medicines that cause drowsiness. ?Do not make important decisions or sign legal documents. ?Do not take care of children on your own. ?Eating and drinking ?Follow any instructions from your health care provider about eating or drinking restrictions. ?When you feel hungry, start by eating small amounts of foods that are soft and easy to digest (bland), such as toast. Gradually return to your regular diet. ?Drink enough fluid to keep your urine pale yellow. ?If you vomit, rehydrate by drinking water, juice, or clear broth. ?General instructions ?If you have sleep apnea, surgery and certain medicines can increase your risk for breathing  problems. Follow instructions from your health care provider about wearing your sleep device: ?Anytime you are sleeping, including during daytime naps. ?While taking prescription pain medicines, sleeping medicines, o

## 2022-04-11 ENCOUNTER — Encounter (HOSPITAL_COMMUNITY)
Admission: RE | Admit: 2022-04-11 | Discharge: 2022-04-11 | Disposition: A | Discharge status: Home / Self Care | Payer: BC Managed Care – PPO | Source: Ambulatory Visit | Attending: Urology | Admitting: Urology

## 2022-04-14 ENCOUNTER — Ambulatory Visit (HOSPITAL_COMMUNITY): Payer: BC Managed Care – PPO | Admitting: Anesthesiology

## 2022-04-14 ENCOUNTER — Other Ambulatory Visit: Payer: Self-pay

## 2022-04-14 ENCOUNTER — Encounter (HOSPITAL_COMMUNITY)
Admission: RE | Disposition: A | Discharge status: Home / Self Care | Payer: Self-pay | Source: Home / Self Care | Attending: Urology

## 2022-04-14 ENCOUNTER — Encounter (HOSPITAL_COMMUNITY): Payer: Self-pay | Admitting: Urology

## 2022-04-14 ENCOUNTER — Ambulatory Visit (HOSPITAL_COMMUNITY)
Admission: RE | Admit: 2022-04-14 | Discharge: 2022-04-14 | Disposition: A | Discharge status: Home / Self Care | Payer: BC Managed Care – PPO | Attending: Urology | Admitting: Urology

## 2022-04-14 DIAGNOSIS — N419 Inflammatory disease of prostate, unspecified: Secondary | ICD-10-CM | POA: Insufficient documentation

## 2022-04-14 DIAGNOSIS — F419 Anxiety disorder, unspecified: Secondary | ICD-10-CM | POA: Insufficient documentation

## 2022-04-14 DIAGNOSIS — I10 Essential (primary) hypertension: Secondary | ICD-10-CM | POA: Diagnosis not present

## 2022-04-14 DIAGNOSIS — K219 Gastro-esophageal reflux disease without esophagitis: Secondary | ICD-10-CM | POA: Diagnosis not present

## 2022-04-14 DIAGNOSIS — Z79899 Other long term (current) drug therapy: Secondary | ICD-10-CM | POA: Diagnosis not present

## 2022-04-14 DIAGNOSIS — G4730 Sleep apnea, unspecified: Secondary | ICD-10-CM | POA: Insufficient documentation

## 2022-04-14 DIAGNOSIS — I861 Scrotal varices: Secondary | ICD-10-CM | POA: Diagnosis not present

## 2022-04-14 HISTORY — PX: VARICOCELECTOMY: SHX1084

## 2022-04-14 LAB — GLUCOSE, CAPILLARY: Glucose-Capillary: 96 mg/dL (ref 70–99)

## 2022-04-14 SURGERY — EXCISION, VARICOCELE
Anesthesia: General | Site: Abdomen | Laterality: Left

## 2022-04-14 MED ORDER — DIPHENHYDRAMINE HCL 50 MG/ML IJ SOLN
INTRAMUSCULAR | Status: DC | PRN
  Administered 2022-04-14: 12.5 mg via INTRAVENOUS

## 2022-04-14 MED ORDER — DEXAMETHASONE SODIUM PHOSPHATE 10 MG/ML IJ SOLN
INTRAMUSCULAR | Status: DC | PRN
Start: 2022-04-14 — End: 2022-04-14
  Administered 2022-04-14: 10 mg via INTRAVENOUS

## 2022-04-14 MED ORDER — HYDROCODONE-ACETAMINOPHEN 7.5-325 MG PO TABS: 1.0000 | ORAL_TABLET | Freq: Four times a day (QID) | ORAL | 0 refills | Status: DC | PRN

## 2022-04-14 MED ORDER — ONDANSETRON HCL 4 MG/2ML IJ SOLN
INTRAMUSCULAR | Status: DC | PRN
  Administered 2022-04-14 (×2): 4 mg via INTRAVENOUS

## 2022-04-14 MED ORDER — MIDAZOLAM HCL 2 MG/2ML IJ SOLN
INTRAMUSCULAR | Status: DC | PRN
  Administered 2022-04-14: 2 mg via INTRAVENOUS

## 2022-04-14 MED ORDER — FENTANYL CITRATE (PF) 100 MCG/2ML IJ SOLN
INTRAMUSCULAR | Status: AC
Start: 2022-04-14 — End: ?
  Filled 2022-04-14: qty 2

## 2022-04-14 MED ORDER — ONDANSETRON HCL 4 MG/2ML IJ SOLN
INTRAMUSCULAR | Status: AC
  Filled 2022-04-14: qty 4

## 2022-04-14 MED ORDER — ONDANSETRON HCL 4 MG/2ML IJ SOLN
INTRAMUSCULAR | Status: AC
  Filled 2022-04-14: qty 2

## 2022-04-14 MED ORDER — HEMOSTATIC AGENTS (NO CHARGE) OPTIME
TOPICAL | Status: DC | PRN
  Administered 2022-04-14: 1 via TOPICAL

## 2022-04-14 MED ORDER — LIDOCAINE HCL (PF) 2 % IJ SOLN
INTRAMUSCULAR | Status: AC
  Filled 2022-04-14: qty 20

## 2022-04-14 MED ORDER — CEFAZOLIN SODIUM-DEXTROSE 1-4 GM/50ML-% IV SOLN
INTRAVENOUS | Status: AC
  Filled 2022-04-14: qty 50

## 2022-04-14 MED ORDER — SUGAMMADEX SODIUM 200 MG/2ML IV SOLN
INTRAVENOUS | Status: DC | PRN
  Administered 2022-04-14: 260 mg via INTRAVENOUS

## 2022-04-14 MED ORDER — HYDROMORPHONE HCL 1 MG/ML IJ SOLN
0.5000 mg | Freq: Once | INTRAMUSCULAR | Status: AC
  Administered 2022-04-14: 0.5 mg via INTRAVENOUS

## 2022-04-14 MED ORDER — CHLORHEXIDINE GLUCONATE 0.12 % MT SOLN
15.0000 mL | Freq: Once | OROMUCOSAL | Status: AC
  Administered 2022-04-14: 15 mL via OROMUCOSAL

## 2022-04-14 MED ORDER — ORAL CARE MOUTH RINSE: 15.0000 mL | Freq: Once | OROMUCOSAL | Status: AC

## 2022-04-14 MED ORDER — CEFAZOLIN SODIUM-DEXTROSE 2-4 GM/100ML-% IV SOLN
INTRAVENOUS | Status: AC
  Filled 2022-04-14: qty 100

## 2022-04-14 MED ORDER — LACTATED RINGERS IV SOLN: INTRAVENOUS | Status: DC

## 2022-04-14 MED ORDER — DIPHENHYDRAMINE HCL 50 MG/ML IJ SOLN
INTRAMUSCULAR | Status: AC
  Filled 2022-04-14: qty 1

## 2022-04-14 MED ORDER — PHENYLEPHRINE 80 MCG/ML (10ML) SYRINGE FOR IV PUSH (FOR BLOOD PRESSURE SUPPORT)
PREFILLED_SYRINGE | INTRAVENOUS | Status: AC
  Filled 2022-04-14: qty 10

## 2022-04-14 MED ORDER — BUPIVACAINE HCL (PF) 0.25 % IJ SOLN
INTRAMUSCULAR | Status: AC
Start: 2022-04-14 — End: ?
  Filled 2022-04-14: qty 30

## 2022-04-14 MED ORDER — FENTANYL CITRATE (PF) 250 MCG/5ML IJ SOLN
INTRAMUSCULAR | Status: DC | PRN
  Administered 2022-04-14: 100 ug via INTRAVENOUS

## 2022-04-14 MED ORDER — ONDANSETRON HCL 4 MG/2ML IJ SOLN
4.0000 mg | Freq: Once | INTRAMUSCULAR | Status: AC | PRN
  Administered 2022-04-14: 4 mg via INTRAVENOUS
  Filled 2022-04-14: qty 2

## 2022-04-14 MED ORDER — PROPOFOL 10 MG/ML IV BOLUS
INTRAVENOUS | Status: AC
  Filled 2022-04-14: qty 20

## 2022-04-14 MED ORDER — METOCLOPRAMIDE HCL 5 MG/ML IJ SOLN
INTRAMUSCULAR | Status: AC
  Filled 2022-04-14: qty 2

## 2022-04-14 MED ORDER — PROPOFOL 10 MG/ML IV BOLUS
INTRAVENOUS | Status: DC | PRN
  Administered 2022-04-14: 180 mg via INTRAVENOUS

## 2022-04-14 MED ORDER — OXYCODONE-ACETAMINOPHEN 5-325 MG PO TABS
2.0000 | ORAL_TABLET | Freq: Once | ORAL | Status: AC
  Administered 2022-04-14: 2 via ORAL

## 2022-04-14 MED ORDER — OXYCODONE-ACETAMINOPHEN 5-325 MG PO TABS
ORAL_TABLET | ORAL | Status: AC
  Filled 2022-04-14: qty 2

## 2022-04-14 MED ORDER — BUPIVACAINE HCL (PF) 0.25 % IJ SOLN
INTRAMUSCULAR | Status: DC | PRN
  Administered 2022-04-14: 10 mL

## 2022-04-14 MED ORDER — HYDROMORPHONE HCL 1 MG/ML IJ SOLN
INTRAMUSCULAR | Status: AC
  Filled 2022-04-14: qty 0.5

## 2022-04-14 MED ORDER — LIDOCAINE 2% (20 MG/ML) 5 ML SYRINGE
INTRAMUSCULAR | Status: DC | PRN
Start: 2022-04-14 — End: 2022-04-14
  Administered 2022-04-14: 80 mg via INTRAVENOUS

## 2022-04-14 MED ORDER — ROCURONIUM BROMIDE 10 MG/ML (PF) SYRINGE
PREFILLED_SYRINGE | INTRAVENOUS | Status: DC | PRN
  Administered 2022-04-14: 60 mg via INTRAVENOUS

## 2022-04-14 MED ORDER — ROCURONIUM BROMIDE 10 MG/ML (PF) SYRINGE
PREFILLED_SYRINGE | INTRAVENOUS | Status: AC
  Filled 2022-04-14: qty 10

## 2022-04-14 MED ORDER — EPHEDRINE 5 MG/ML INJ
INTRAVENOUS | Status: AC
  Filled 2022-04-14: qty 5

## 2022-04-14 MED ORDER — MEPERIDINE HCL 50 MG/ML IJ SOLN: 6.2500 mg | INTRAMUSCULAR | Status: DC | PRN

## 2022-04-14 MED ORDER — MIDAZOLAM HCL 2 MG/2ML IJ SOLN
INTRAMUSCULAR | Status: AC
  Filled 2022-04-14: qty 2

## 2022-04-14 MED ORDER — CEFAZOLIN IN SODIUM CHLORIDE 3-0.9 GM/100ML-% IV SOLN
3.0000 g | INTRAVENOUS | Status: AC
  Administered 2022-04-14: 3 g via INTRAVENOUS

## 2022-04-14 MED ORDER — SODIUM CHLORIDE 0.9 % IR SOLN
Status: DC | PRN
Start: 2022-04-14 — End: 2022-04-14
  Administered 2022-04-14: 1000 mL

## 2022-04-14 MED ORDER — HYDROMORPHONE HCL 1 MG/ML IJ SOLN
0.2500 mg | INTRAMUSCULAR | Status: AC | PRN
  Administered 2022-04-14 (×4): 0.5 mg via INTRAVENOUS
  Filled 2022-04-14 (×4): qty 0.5

## 2022-04-14 SURGICAL SUPPLY — 35 items
ADH SKN CLS APL DERMABOND .7 (GAUZE/BANDAGES/DRESSINGS) ×1
COVER LIGHT HANDLE STERIS (MISCELLANEOUS) ×4 IMPLANT
DECANTER SPIKE VIAL GLASS SM (MISCELLANEOUS) ×2 IMPLANT
DERMABOND ADVANCED (GAUZE/BANDAGES/DRESSINGS) ×1
DERMABOND ADVANCED .7 DNX12 (GAUZE/BANDAGES/DRESSINGS) ×1 IMPLANT
DRAIN PENROSE 12X.25 LTX STRL (MISCELLANEOUS) ×2 IMPLANT
ELECT NDL TIP 2.8 STRL (NEEDLE) ×1 IMPLANT
ELECT NEEDLE TIP 2.8 STRL (NEEDLE) ×2 IMPLANT
ELECT REM PT RETURN 9FT ADLT (ELECTROSURGICAL) ×2
ELECTRODE REM PT RTRN 9FT ADLT (ELECTROSURGICAL) ×1 IMPLANT
GLOVE BIO SURGEON STRL SZ8 (GLOVE) ×2 IMPLANT
GLOVE BIOGEL PI IND STRL 7.0 (GLOVE) ×2 IMPLANT
GLOVE BIOGEL PI INDICATOR 7.0 (GLOVE) ×2
GOWN STRL REUS W/TWL LRG LVL3 (GOWN DISPOSABLE) ×4 IMPLANT
INST SET MINOR GENERAL (KITS) ×2 IMPLANT
KIT TURNOVER KIT A (KITS) ×2 IMPLANT
LOOP VESSEL MAXI BLUE (MISCELLANEOUS) ×2 IMPLANT
MANIFOLD NEPTUNE II (INSTRUMENTS) ×2 IMPLANT
NDL HYPO 25X1 1.5 SAFETY (NEEDLE) ×1 IMPLANT
NEEDLE HYPO 25X1 1.5 SAFETY (NEEDLE) ×2 IMPLANT
PACK MINOR (CUSTOM PROCEDURE TRAY) ×2 IMPLANT
PAD ARMBOARD 7.5X6 YLW CONV (MISCELLANEOUS) ×2 IMPLANT
PENCIL SMOKE EVACUATOR (MISCELLANEOUS) ×2 IMPLANT
POWDER SURGICEL 3.0 GRAM (HEMOSTASIS) ×1 IMPLANT
SET BASIN LINEN APH (SET/KITS/TRAYS/PACK) ×2 IMPLANT
SOL PREP PROV IODINE SCRUB 4OZ (MISCELLANEOUS) ×2 IMPLANT
SURGILUBE 2OZ TUBE FLIPTOP (MISCELLANEOUS) ×2 IMPLANT
SUT CHROMIC 3 0 PS 2 (SUTURE) ×1 IMPLANT
SUT MNCRL AB 4-0 PS2 18 (SUTURE) ×2 IMPLANT
SUT SILK 2 0 (SUTURE) ×4
SUT SILK 2-0 18XBRD TIE 12 (SUTURE) ×1 IMPLANT
SUT VIC AB 2-0 CT2 27 (SUTURE) ×1 IMPLANT
SUT VIC AB 3-0 SH 27 (SUTURE) ×2
SUT VIC AB 3-0 SH 27X BRD (SUTURE) ×1 IMPLANT
SYR CONTROL 10ML LL (SYRINGE) ×2 IMPLANT

## 2022-04-14 NOTE — Anesthesia Postprocedure Evaluation (Signed)
Anesthesia Post Note ? ?Patient: ESTES LEHNER ? ?Procedure(s) Performed: VARICOCELECTOMY-left neurolysis (Left: Abdomen) ? ?Patient location during evaluation: Phase II ?Anesthesia Type: General ?Level of consciousness: awake and alert and oriented ?Pain management: pain level controlled ?Vital Signs Assessment: post-procedure vital signs reviewed and stable ?Respiratory status: spontaneous breathing, nonlabored ventilation and respiratory function stable ?Cardiovascular status: blood pressure returned to baseline and stable ?Postop Assessment: no apparent nausea or vomiting ?Anesthetic complications: no ? ? ?No notable events documented. ? ? ?Last Vitals:  ?Vitals:  ? 04/14/22 1215 04/14/22 1233  ?BP: 134/79   ?Pulse: 71 67  ?Resp: 14 17  ?Temp:  36.6 ?C  ?SpO2: 97% 96%  ?  ?Last Pain:  ?Vitals:  ? 04/14/22 1233  ?TempSrc: Oral  ?PainSc: 5   ? ? ?  ?  ?  ?  ?  ?  ? ?Valaria Kohut C Nahal Wanless ? ? ? ? ?

## 2022-04-14 NOTE — OR Nursing (Signed)
Migraine this morning rated 9, ?After adminstered rated pain 6 to 7 ., ? At present pain level 4 ?

## 2022-04-14 NOTE — Transfer of Care (Signed)
Immediate Anesthesia Transfer of Care Note ? ?Patient: Chase Wyatt ? ?Procedure(s) Performed: VARICOCELECTOMY-left neurolysis (Left: Abdomen) ? ?Patient Location: PACU ? ?Anesthesia Type:General ? ?Level of Consciousness: awake ? ?Airway & Oxygen Therapy: Patient Spontanous Breathing ? ?Post-op Assessment: Report given to RN and Post -op Vital signs reviewed and stable ? ?Post vital signs: Reviewed and stable ? ?Last Vitals:  ?Vitals Value Taken Time  ?BP 125/66 04/14/22 1106  ?Temp    ?Pulse 72 04/14/22 1109  ?Resp 11 04/14/22 1110  ?SpO2 90 % 04/14/22 1109  ?Vitals shown include unvalidated device data. ? ?Last Pain:  ?Vitals:  ? 04/14/22 0912  ?TempSrc: Oral  ?PainSc: 7   ?   ? ?Patients Stated Pain Goal: 5 (04/14/22 0912) ? ?Complications: No notable events documented. ?

## 2022-04-14 NOTE — Anesthesia Procedure Notes (Signed)
Procedure Name: Intubation ?Date/Time: 04/14/2022 9:50 AM ?Performed by: Minerva Ends, CRNA ?Pre-anesthesia Checklist: Patient identified, Emergency Drugs available, Suction available and Patient being monitored ?Patient Re-evaluated:Patient Re-evaluated prior to induction ?Oxygen Delivery Method: Circle system utilized ?Preoxygenation: Pre-oxygenation with 100% oxygen ?Induction Type: IV induction ?Ventilation: Mask ventilation without difficulty ?Laryngoscope Size: Mac and 4 ?Grade View: Grade II ?Tube type: Oral ?Tube size: 7.0 mm ?Number of attempts: 1 ?Airway Equipment and Method: Stylet and Oral airway ?Placement Confirmation: ETT inserted through vocal cords under direct vision, positive ETCO2 and breath sounds checked- equal and bilateral ?Secured at: 23 cm ?Tube secured with: Tape ?Dental Injury: Teeth and Oropharynx as per pre-operative assessment  ? ? ? ? ?

## 2022-04-14 NOTE — Interval H&P Note (Signed)
History and Physical Interval Note: ? ?04/14/2022 ?9:06 AM ? ?Chase Wyatt  has presented today for surgery, with the diagnosis of left orchalgia.  The various methods of treatment have been discussed with the patient and family. After consideration of risks, benefits and other options for treatment, the patient has consented to  Procedure(s): ?VARICOCELECTOMY-left neurolysis (Left) as a surgical intervention.  The patient's history has been reviewed, patient examined, no change in status, stable for surgery.  I have reviewed the patient's chart and labs.  Questions were answered to the patient's satisfaction.   ? ? ?[name] ? ? ?

## 2022-04-14 NOTE — Anesthesia Preprocedure Evaluation (Addendum)
Anesthesia Evaluation  ?Patient identified by MRN, date of birth, ID band ?Patient awake ? ? ? ?Reviewed: ?Allergy & Precautions, NPO status , Patient's Chart, lab work & pertinent test results ? ?History of Anesthesia Complications ?(+) PONV, PROLONGED EMERGENCE and history of anesthetic complications ? ?Airway ?Mallampati: II ? ?TM Distance: >3 FB ?Neck ROM: Full ? ? ? Dental ? ?(+) Dental Advisory Given, Missing ?  ?Pulmonary ?sleep apnea and Continuous Positive Airway Pressure Ventilation ,  ?  ?Pulmonary exam normal ?breath sounds clear to auscultation ? ? ? ? ? ? Cardiovascular ?Exercise Tolerance: Good ?hypertension, Pt. on medications ?Normal cardiovascular exam+ Valvular Problems/Murmurs  ?Rhythm:Regular Rate:Normal ? ? ?  ?Neuro/Psych ?Anxiety  Neuromuscular disease   ? GI/Hepatic ?GERD  Medicated and Controlled,(+)  ?  ? substance abuse ? alcohol use,   ?Endo/Other  ?negative endocrine ROS ? Renal/GU ?Renal InsufficiencyRenal disease  ? ?  ?Musculoskeletal ? ?(+) Arthritis , thoracic laminectomy, spinal cord stimulato  ? Abdominal ?  ?Peds ? Hematology ?  ?Anesthesia Other Findings ? ? Reproductive/Obstetrics ? ?  ? ? ? ? ? ? ? ? ? ? ? ? ? ?  ?  ? ? ? ? ? ? ? ?Anesthesia Physical ? ?Anesthesia Plan ? ?ASA: 2 ? ?Anesthesia Plan: General  ? ?Post-op Pain Management: Dilaudid IV  ? ?Induction: Intravenous ? ?PONV Risk Score and Plan: 4 or greater and Ondansetron, Dexamethasone and Midazolam ? ?Airway Management Planned: Oral ETT ? ?Additional Equipment:  ? ?Intra-op Plan:  ? ?Post-operative Plan: Extubation in OR ? ?Informed Consent: I have reviewed the patients History and Physical, chart, labs and discussed the procedure including the risks, benefits and alternatives for the proposed anesthesia with the patient or authorized representative who has indicated his/her understanding and acceptance.  ? ? ? ?Dental advisory given ? ?Plan Discussed with: CRNA and  Surgeon ? ?Anesthesia Plan Comments:   ? ? ? ? ? ? ?Anesthesia Quick Evaluation ? ?

## 2022-04-14 NOTE — Op Note (Signed)
Preoperative diagnosis: left orchalgia ?  ?Postoperative diagnosis: Same ?  ?Procedure: 1. left varicocelectomy ?2. left subinguinal neurolysis  ?3. vasectomy ?  ?Attending: [name], MD ?  ?Anesthesia: General ?  ?History of blood loss: Minimal ?  ?Antibiotics: ancef ?  ?Drains: none ?  ?Specimens: none ?  ?Findings: numerous tortuous veins in left spermatic cord. Testicular artery identified and preserved using doppler. ?  ?Indications: Patient is a 51 year old male with a history of left testicular pain after inguinal hernia repair who failed inguinal neurolysis .  After discussing treatment options he decided to proceed with varicocelectomy, neurolysis and vasectomy.  ?  ?Procedure in detail: Prior to procedure consent was obtained.  Patient was brought to the operating room and a brief timeout was done to ensure correct patient, correct procedure, correct site.  General anesthesia was administered and patient was placed in supine position.  His genitalia and abdomen was then prepped and draped in usual sterile fashion.  A 4 cm incision was made in the left subinguinal region.  We dissected down through the subcutaneous tissue to until we reached the spermatic cord.  The spermatic cord was identified and a Penrose drain was placed under the cord.  We then opened the cord with sharp dissection. We proceeded to identify the testicular artery with the doppler. Once the artery was identified and loop was placed around it and it was excluded from the remainder of the spermatic cord. We then proceeded to ligate the veins in the cord with 3-0 silk ties. We also ligated the vas deferens with 0 silk tie. Once all the veins were ligated we once again check for flow in the testicular artery and we noted good flow. We then inspected the operative field and no residual bleeding was noted. We then closed the subcutaneous tissues in 2 layers with 2-0 Vicryl in a running fashion.  We then closed the skin with 4-0  Monocryl in a running fashion.  Skin glue was then placed over the incision. We then placed a scrotal fluff and this then concluded the procedure which was well tolerated by the patient. ?  ?Complications: None ?  ?Condition: Stable, extubated, transferred to PACU. ?  ?Plan: Patient is to be discharged home.  He is to follow up in 2 weeks for wound check ?

## 2022-04-19 ENCOUNTER — Encounter (HOSPITAL_COMMUNITY): Payer: Self-pay | Admitting: Urology

## 2022-04-27 ENCOUNTER — Encounter: Payer: Self-pay | Admitting: Urology

## 2022-04-27 ENCOUNTER — Ambulatory Visit (INDEPENDENT_AMBULATORY_CARE_PROVIDER_SITE_OTHER): Payer: BC Managed Care – PPO | Admitting: Urology

## 2022-04-27 VITALS — BP 161/84 | HR 67

## 2022-04-27 DIAGNOSIS — R1032 Left lower quadrant pain: Secondary | ICD-10-CM

## 2022-04-27 DIAGNOSIS — N451 Epididymitis: Secondary | ICD-10-CM

## 2022-04-27 MED ORDER — SULFAMETHOXAZOLE-TRIMETHOPRIM 800-160 MG PO TABS: 1.0000 | ORAL_TABLET | Freq: Two times a day (BID) | ORAL | 0 refills | Status: DC

## 2022-04-27 NOTE — Progress Notes (Signed)
04/27/2022 2:44 PM   Chase Wyatt 1971-02-23 [id number]  Referring provider: [name], MD [place],  [place]  Followup left neurolysis   HPI: [name] is a 51yo here for followup for left neurolysis. His inguinal pain is unchanged and he notes increased pain to palpation of his left testis. He has pain with sitting. No pain with standing. No worsening swelling of the left scrotum. No draiange from the incision. 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   BACK SURGERY     multiple   CARDIAC CATHETERIZATION  2013   [place], no stents   CHOLECYSTECTOMY  10/28/2011   Procedure: LAPAROSCOPIC CHOLECYSTECTOMY;  Surgeon: Jamesetta So;  Location: [facility];  Service: General;  Laterality: N/A;   INGUINAL HERNIA REPAIR Left 08/07/2020   Procedure: HERNIA REPAIR INGUINAL ADULT using MESH MARLEX PLUG MEDIUM;  Surgeon: Aviva Signs, MD;  Location: [facility];  Service: General;  Laterality: Left;   INGUINAL HERNIA REPAIR Right 01/11/2021   Procedure: HERNIA REPAIR INGUINAL ADULT;  Surgeon: Aviva Signs, MD;  Location: [facility];  Service: General;  Laterality: Right;   LAMINECTOMY THORACIC SPINE W/ PLACEMENT SPINAL CORD STIMULATOR     SHOULDER OPEN ROTATOR CUFF REPAIR Left    [place]   UMBILICAL HERNIA REPAIR N/A 08/07/2020   Procedure: HERNIA REPAIR UMBILICAL ADULT;  Surgeon: Aviva Signs, MD;  Location: [facility];  Service: General;  Laterality: N/A;   VARICOCELECTOMY Right 06/10/2021   Procedure: right,  neurolysis-subinguial;  Surgeon: Cleon Gustin, MD;  Location: [facility];  Service: Urology;  Laterality: Right;   VARICOCELECTOMY Right 09/02/2021   Procedure: VARICOCELECTOMY-right subingunial neurolysis;  Surgeon: Cleon Gustin, MD;  Location: [facility];  Service: Urology;  Laterality: Right;   VARICOCELECTOMY Left 04/14/2022   Procedure: VARICOCELECTOMY-left neurolysis;  Surgeon: Cleon Gustin, MD;  Location: [facility];  Service: Urology;  Laterality: Left;   VASECTOMY N/A 09/02/2021   Procedure: VASECTOMY;  Surgeon: Cleon Gustin, MD;  Location: [facility];  Service: Urology;  Laterality: N/A;    Home Medications:  Allergies as of 04/27/2022       Reactions   Benzoin Rash   Blisters   Freederm Adhesive Remover [new Skin] Itching   Meloxicam Hives   Oxycontin [oxycodone] Other (See Comments)   Headache        Medication List        Accurate as of Apr 27, 2022  2:44 PM. If you have any questions, ask your nurse or doctor.          ALPRAZolam 0.5 MG tablet Commonly known as: XANAX Take 0.5 mg by mouth daily as needed for anxiety.   aspirin EC 81 MG tablet Take 81 mg by mouth daily. Swallow whole.   cetirizine 10 MG tablet Commonly known as: ZYRTEC Take 10 mg by mouth daily.   eletriptan 40 MG tablet Commonly known as: RELPAX  Take 40 mg by mouth daily as needed for migraine.   fluticasone 50 MCG/ACT nasal spray Commonly known as: FLONASE Place 1 spray into both nostrils daily.   HYDROcodone-acetaminophen 7.5-325 MG tablet Commonly known as: Norco Take 1 tablet by mouth every 6 (six) hours as needed for moderate pain.   ibuprofen 200 MG tablet Commonly known as: ADVIL Take 400 mg by mouth every 6 (six) hours as needed for headache.   multivitamin tablet Take 1 tablet by mouth daily.   naproxen 500 MG tablet Commonly known as: NAPROSYN Take 500 mg by mouth daily as needed for migraine.   omeprazole 40 MG capsule Commonly known as:  PRILOSEC Take 40 mg by mouth daily.   ondansetron 4 MG tablet Commonly known as: ZOFRAN Take 4 mg by mouth every 8 (eight) hours as needed (migraine related nausea).   sulfamethoxazole-trimethoprim 800-160 MG tablet Commonly known as: BACTRIM DS Take 1 tablet by mouth every 12 (twelve) hours.   tamsulosin 0.4 MG Caps capsule Commonly known as: FLOMAX Take 0.4 mg by mouth at bedtime.   testosterone cypionate 200 MG/ML injection Commonly known as: DEPOTESTOSTERONE CYPIONATE Inject 400 mg into the muscle every 14 (fourteen) days.   valACYclovir 1000 MG tablet Commonly known as: VALTREX Take 1,000 mg by mouth daily as needed (fever blisters).   zolpidem 10 MG tablet Commonly known as: AMBIEN Take 10 mg by mouth at bedtime.        Allergies:  Allergies  Allergen Reactions   Benzoin Rash    Blisters   Freederm Adhesive Remover [New Skin] Itching   Meloxicam Hives   Oxycontin [Oxycodone] Other (See Comments)    Headache    Family History: Family History  Problem Relation Age of Onset   Hypotension Neg Hx    Anesthesia problems Neg Hx    Malignant hyperthermia Neg Hx    Pseudochol deficiency Neg Hx     Social History:  reports that he has never smoked. He has quit using smokeless tobacco. He reports current alcohol use of about 1.0 standard drink per week. He reports that he does not use drugs.  ROS: All other review of systems were reviewed and are negative except what is noted above in HPI  Physical Exam: BP (!) 161/84   Pulse 67   Constitutional:  Alert and oriented, No acute distress. HEENT: [place] AT, moist mucus membranes.  Trachea midline, no masses. Cardiovascular: No clubbing, cyanosis, or edema. Respiratory: Normal respiratory effort, no increased work of breathing. GI: Abdomen is soft, nontender, nondistended, no abdominal masses GU: No CVA tenderness. Left testis mildly tender and left epididymis indurated, tender  Lymph: No cervical or inguinal  lymphadenopathy. Skin: No rashes, bruises or suspicious lesions. Neurologic: Grossly intact, no focal deficits, moving all 4 extremities. Psychiatric: Normal mood and affect.  Laboratory Data: Lab Results  Component Value Date   WBC 6.3 08/31/2021   HGB 16.0 08/31/2021   HCT 47.9 08/31/2021   MCV 89.2 08/31/2021   PLT 150 08/31/2021    Lab Results  Component Value Date   CREATININE 1.32 (H) 08/31/2021    No results found for: PSA  No results found for: TESTOSTERONE  No results found for: HGBA1C  Urinalysis    Component Value Date/Time   APPEARANCEUR Clear 03/23/2022 0859   GLUCOSEU Negative 03/23/2022 0859   BILIRUBINUR Negative 03/23/2022 0859   PROTEINUR Negative 03/23/2022 0859   NITRITE Negative 03/23/2022 0859   LEUKOCYTESUR Negative 03/23/2022 0859    Lab Results  Component Value Date   LABMICR Comment 03/23/2022    Pertinent Imaging:  No results found for this or any previous visit.  No results found for this or any previous visit.  No results found for this or any previous visit.  No results found for this or any previous visit.  No results found for this or any previous visit.  No results found for this or any previous visit.  No results found for this or any previous visit.  No results found for this or any previous visit.   Assessment & Plan:    1. Left epididymitis -bactrim DS BID for 10 days - Urinalysis, Routine w reflex microscopic   No follow-ups on file.  [name], MD  [facility] [place]

## 2022-05-04 ENCOUNTER — Telehealth: Payer: Self-pay

## 2022-05-04 NOTE — Telephone Encounter (Signed)
Patient called with questions regarding his infection and rx.  Did not leave anymore details.  Returned his call with no answer, left voicemail to call back with questions.  ?

## 2022-05-05 ENCOUNTER — Telehealth: Payer: Self-pay

## 2022-05-05 ENCOUNTER — Other Ambulatory Visit: Payer: Self-pay | Admitting: Urology

## 2022-05-05 MED ORDER — HYDROCODONE-ACETAMINOPHEN 5-325 MG PO TABS: 1.0000 | ORAL_TABLET | Freq: Four times a day (QID) | ORAL | 0 refills | Status: DC | PRN

## 2022-05-05 NOTE — Telephone Encounter (Signed)
Patient is asking for his pain meds to be refilled and how long it will take for the antibiotic to work?  Please advise.

## 2022-05-05 NOTE — Telephone Encounter (Signed)
Spoke with patient. Understands pain medication sent to pharmacy.  Patient reports he takes last dose of antibiotic today.  Questioning if he should take an extended dose?

## 2022-05-05 NOTE — Telephone Encounter (Signed)
Message left for patient

## 2022-05-05 NOTE — Progress Notes (Signed)
Pt c/o persistent pain and requests RF on Norco. Pt advised to complete antibiotics given by Dr. Alyson Ingles and Norco '5mg'$  sent to his pharmacy.

## 2022-05-06 ENCOUNTER — Telehealth: Payer: Self-pay

## 2022-05-06 ENCOUNTER — Ambulatory Visit (INDEPENDENT_AMBULATORY_CARE_PROVIDER_SITE_OTHER): Payer: BC Managed Care – PPO | Admitting: Urology

## 2022-05-06 VITALS — BP 153/89 | HR 64 | Ht 76.0 in | Wt 265.0 lb

## 2022-05-06 DIAGNOSIS — N50812 Left testicular pain: Secondary | ICD-10-CM

## 2022-05-06 DIAGNOSIS — N50819 Testicular pain, unspecified: Secondary | ICD-10-CM

## 2022-05-06 LAB — URINALYSIS, ROUTINE W REFLEX MICROSCOPIC
Bilirubin, UA: NEGATIVE
Glucose, UA: NEGATIVE
Ketones, UA: NEGATIVE
Leukocytes,UA: NEGATIVE
Nitrite, UA: NEGATIVE
Protein,UA: NEGATIVE
RBC, UA: NEGATIVE
Specific Gravity, UA: 1.01 (ref 1.005–1.030)
Urobilinogen, Ur: 0.2 mg/dL (ref 0.2–1.0)
pH, UA: 6.5 (ref 5.0–7.5)

## 2022-05-06 MED ORDER — DOXYCYCLINE HYCLATE 100 MG PO CAPS: 100.0000 mg | ORAL_CAPSULE | Freq: Two times a day (BID) | ORAL | 0 refills | Status: DC

## 2022-05-06 MED ORDER — GABAPENTIN 100 MG PO CAPS: 100.0000 mg | ORAL_CAPSULE | Freq: Three times a day (TID) | ORAL | 1 refills | Status: DC

## 2022-05-06 NOTE — Telephone Encounter (Signed)
Apt today with Dr. Alyson Ingles per patient request,

## 2022-05-06 NOTE — Telephone Encounter (Signed)
Patient is concerned with his symptoms he is feeling worse today that he did yesterday, patient requesting to be seen today.  Scheduled with Dr. Alyson Ingles.

## 2022-05-06 NOTE — Progress Notes (Signed)
05/06/2022 12:35 PM   Chase Wyatt Apr 21, 1971 [id number]  Referring provider: [name], MD [place],  [place]  Left inguinal pain   HPI: Chase Wyatt is a 51yo here for followup for left testis pain. His pain only marginally improved after bactrim. He continue to have sharp, intermittent, moderate left testis pain that is worse with activity and better with rest. NO worsening swelling over the incision. 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   BACK SURGERY     multiple   CARDIAC CATHETERIZATION  2013   [place], no stents   CHOLECYSTECTOMY  10/28/2011   Procedure: LAPAROSCOPIC CHOLECYSTECTOMY;  Surgeon: Jamesetta So;  Location: [facility];  Service: General;  Laterality: N/A;   INGUINAL HERNIA REPAIR Left 08/07/2020   Procedure: HERNIA REPAIR INGUINAL ADULT using MESH MARLEX PLUG MEDIUM;  Surgeon: Aviva Signs, MD;  Location: [facility];  Service: General;  Laterality: Left;   INGUINAL HERNIA REPAIR Right 01/11/2021   Procedure: HERNIA REPAIR INGUINAL ADULT;  Surgeon: Aviva Signs, MD;  Location: [facility];  Service: General;  Laterality: Right;   LAMINECTOMY THORACIC SPINE W/ PLACEMENT SPINAL CORD STIMULATOR     SHOULDER OPEN ROTATOR CUFF REPAIR Left    [place]   UMBILICAL HERNIA REPAIR N/A 08/07/2020   Procedure: HERNIA REPAIR UMBILICAL ADULT;  Surgeon: Aviva Signs, MD;  Location: [facility];  Service: General;  Laterality: N/A;   VARICOCELECTOMY Right 06/10/2021   Procedure: right, neurolysis-subinguial;   Surgeon: Cleon Gustin, MD;  Location: [facility];  Service: Urology;  Laterality: Right;   VARICOCELECTOMY Right 09/02/2021   Procedure: VARICOCELECTOMY-right subingunial neurolysis;  Surgeon: Cleon Gustin, MD;  Location: [facility];  Service: Urology;  Laterality: Right;   VARICOCELECTOMY Left 04/14/2022   Procedure: VARICOCELECTOMY-left neurolysis;  Surgeon: Cleon Gustin, MD;  Location: [facility];  Service: Urology;  Laterality: Left;   VASECTOMY N/A 09/02/2021   Procedure: VASECTOMY;  Surgeon: Cleon Gustin, MD;  Location: [facility];  Service: Urology;  Laterality: N/A;    Home Medications:  Allergies as of 05/06/2022       Reactions   Benzoin Rash   Blisters   Freederm Adhesive Remover [new Skin] Itching   Meloxicam Hives   Oxycontin [oxycodone] Other (See Comments)   Headache        Medication List        Accurate as of May 06, 2022 12:35 PM. If you have any questions, ask your nurse or doctor.          ALPRAZolam 0.5 MG tablet Commonly known as: XANAX Take 0.5 mg by mouth daily as needed for anxiety.   aspirin EC 81 MG tablet Take 81 mg by mouth daily. Swallow whole.   cetirizine 10 MG tablet Commonly known as: ZYRTEC Take 10 mg by mouth daily.   eletriptan 40 MG tablet Commonly known as: RELPAX Take 40 mg by  mouth daily as needed for migraine.   fluticasone 50 MCG/ACT nasal spray Commonly known as: FLONASE Place 1 spray into both nostrils daily.   HYDROcodone-acetaminophen 5-325 MG tablet Commonly known as: Norco Take 1 tablet by mouth every 6 (six) hours as needed for moderate pain.   ibuprofen 200 MG tablet Commonly known as: ADVIL Take 400 mg by mouth every 6 (six) hours as needed for headache.   multivitamin tablet Take 1 tablet by mouth daily.   naproxen 500 MG tablet Commonly known as: NAPROSYN Take 500 mg by mouth daily as needed for migraine.   omeprazole 40 MG capsule Commonly known as: PRILOSEC Take 40 mg by mouth daily.    ondansetron 4 MG tablet Commonly known as: ZOFRAN Take 4 mg by mouth every 8 (eight) hours as needed (migraine related nausea).   sulfamethoxazole-trimethoprim 800-160 MG tablet Commonly known as: BACTRIM DS Take 1 tablet by mouth every 12 (twelve) hours.   tamsulosin 0.4 MG Caps capsule Commonly known as: FLOMAX Take 0.4 mg by mouth at bedtime.   testosterone cypionate 200 MG/ML injection Commonly known as: DEPOTESTOSTERONE CYPIONATE Inject 400 mg into the muscle every 14 (fourteen) days.   valACYclovir 1000 MG tablet Commonly known as: VALTREX Take 1,000 mg by mouth daily as needed (fever blisters).   zolpidem 10 MG tablet Commonly known as: AMBIEN Take 10 mg by mouth at bedtime.        Allergies:  Allergies  Allergen Reactions   Benzoin Rash    Blisters   Freederm Adhesive Remover [New Skin] Itching   Meloxicam Hives   Oxycontin [Oxycodone] Other (See Comments)    Headache    Family History: Family History  Problem Relation Age of Onset   Hypotension Neg Hx    Anesthesia problems Neg Hx    Malignant hyperthermia Neg Hx    Pseudochol deficiency Neg Hx     Social History:  reports that he has never smoked. He has quit using smokeless tobacco. He reports current alcohol use of about 1.0 standard drink per week. He reports that he does not use drugs.  ROS: All other review of systems were reviewed and are negative except what is noted above in HPI  Physical Exam: BP (!) 153/89   Pulse 64   Ht '6\' 4"'$  (1.93 m)   Wt 265 lb (120.2 kg)   BMI 32.26 kg/m   Constitutional:  Alert and oriented, No acute distress. HEENT: [place] AT, moist mucus membranes.  Trachea midline, no masses. Cardiovascular: No clubbing, cyanosis, or edema. Respiratory: Normal respiratory effort, no increased work of breathing. GI: Abdomen is soft, nontender, nondistended, no abdominal masses GU: No CVA tenderness. Circumcised phallus. No masses/lesions on penis, testis, scrotum. Tender left  epididymis Lymph: No cervical or inguinal lymphadenopathy. Skin: No rashes, bruises or suspicious lesions. Neurologic: Grossly intact, no focal deficits, moving all 4 extremities. Psychiatric: Normal mood and affect.  Laboratory Data: Lab Results  Component Value Date   WBC 6.3 08/31/2021   HGB 16.0 08/31/2021   HCT 47.9 08/31/2021   MCV 89.2 08/31/2021   PLT 150 08/31/2021    Lab Results  Component Value Date   CREATININE 1.32 (H) 08/31/2021    No results found for: PSA  No results found for: TESTOSTERONE  No results found for: HGBA1C  Urinalysis    Component Value Date/Time   APPEARANCEUR Clear 03/23/2022 0859   GLUCOSEU Negative 03/23/2022 0859   BILIRUBINUR Negative 03/23/2022 0859   PROTEINUR Negative 03/23/2022 0859  NITRITE Negative 03/23/2022 0859   LEUKOCYTESUR Negative 03/23/2022 0859    Lab Results  Component Value Date   LABMICR Comment 03/23/2022    Pertinent Imaging:  No results found for this or any previous visit.  No results found for this or any previous visit.  No results found for this or any previous visit.  No results found for this or any previous visit.  No results found for this or any previous visit.  No results found for this or any previous visit.  No results found for this or any previous visit.  No results found for this or any previous visit.   Assessment & Plan:    1. Pain in testicle, unspecified laterality -doxycyline '100mg'$  BID for 10days - Urinalysis, Routine w reflex microscopic   No follow-ups on file.  [name], MD  [facility] [place]

## 2022-05-11 ENCOUNTER — Encounter: Payer: Self-pay | Admitting: Urology

## 2022-05-11 NOTE — Patient Instructions (Signed)
Epididymitis  Epididymitis is inflammation or swelling of the epididymis. This is caused by an infection. The epididymis is a cord-like structure that is located along the top and back part of the testicle. It collects and stores sperm from the testicle. This condition can also cause pain and swelling of the testicle and scrotum. Symptoms usually start suddenly (acute epididymitis). Sometimes epididymitis starts gradually and lasts for a while (chronic epididymitis). Chronic epididymitis may be harder to treat. What are the causes? In men ages 20-40, this condition is usually caused by a bacterial infection or a sexually transmitted infection (STI), such as gonorrhea or chlamydia. In men 40 and older, this condition is usually caused by bacteria from a urinary blockage or from abnormalities in the urinary system. These can result from: Having a tube placed into the bladder (urinary catheter). Having an enlarged or inflamed prostate gland. Having recently had urinary tract surgery. Having a problem with a backward flow of urine (retrograde). In men who have a condition that weakens the body's defense system (immune system), such as human immunodeficiency virus (HIV), this condition can be caused by: Other bacteria, including tuberculosis and syphilis. Viruses. Fungi. Sometimes this condition occurs without infection. This may happen because of trauma or repetitive activities such as sports. What increases the risk? You are more likely to develop this condition if you have: Unprotected sex with more than one partner. Anal sex. Had recent surgery. A urinary catheter. Urinary problems. A suppressed immune system. What are the signs or symptoms? This condition usually begins suddenly with chills, fever, and pain behind the scrotum and in the testicle. Other symptoms include: Swelling of the scrotum, testicle, or both. Pain when ejaculating or urinating. Pain in the back or  abdomen. Nausea. Itching and discharge from the penis. A frequent need to pass urine. Redness, increased warmth, and tenderness of the scrotum. How is this diagnosed? Your health care provider can diagnose this condition based on your symptoms and medical history. Your health care provider will also do a physical exam to check your scrotum and testicle for swelling, pain, and redness. You may also have other tests, including: Testing of discharge from the penis. Testing your urine for infections, such as STIs. Ultrasound to check for blood flow and inflammation. Your health care provider may test you for other STIs, including HIV. How is this treated? Treatment for this condition depends on the cause. If your condition is caused by a bacterial infection, oral antibiotic medicine may be prescribed. If the bacterial infection has spread to your blood, you may need to receive IV antibiotics. For both bacterial and nonbacterial epididymitis, you may be treated with: Rest. Elevation of the scrotum. Pain medicines. Anti-inflammatory medicines. Surgery may be needed if: You have pus buildup in the scrotum (abscess). You have epididymitis that has not responded to other treatments. Follow these instructions at home: Medicines Take over-the-counter and prescription medicines only as told by your health care provider. If you were prescribed an antibiotic medicine, take it as told by your health care provider. Do not stop taking the antibiotic even if your condition improves. Sexual activity If your epididymitis was caused by an STI, avoid sexual activity until your treatment is complete. Inform your sexual partner or partners if you test positive for an STI. They may need to be treated. Do not engage in sexual activity with your partner or partners until their treatment is completed. Managing pain and swelling  If directed, raise (elevate) your scrotum and apply ice.   To do this: Put ice in a  plastic bag. Place a small towel or pillow between your legs. Rest your scrotum on the pillow or towel. Place another towel between your skin and the plastic bag. Leave the ice on for 20 minutes, 2-3 times a day. Remove the ice if your skin turns bright red. This is very important. If you cannot feel pain, heat, or cold, you have a greater risk of damage to the area. Keep your scrotum elevated and supported while resting. Ask your health care provider if you should wear a scrotal support, such as a jockstrap. Wear it as told by your health care provider. Try taking a sitz bath to help with discomfort. This is a warm water bath that is taken while you are sitting down. The water should come up to your hips and should cover your buttocks. Do this 3-4 times per day or as told by your health care provider. General instructions Drink enough fluid to keep your urine pale yellow. Return to your normal activities as told by your health care provider. Ask your health care provider what activities are safe for you. Keep all follow-up visits. This is important. Contact a health care provider if: You have a fever. Your pain medicine is not helping. Your pain is getting worse. Your symptoms do not improve within 3 days. Summary Epididymitis is inflammation or swelling of the epididymis. This is caused by an infection. This condition can also cause pain and swelling of the testicle and scrotum. Treatment for this condition depends on the cause. If your condition is caused by a bacterial infection, oral antibiotic medicine may be prescribed. Inform your sexual partner or partners if you test positive for an STI. They may need to be treated. Do not engage in sexual activity with your partner or partners until their treatment is completed. Contact a health care provider if your symptoms do not improve within 3 days. This information is not intended to replace advice given to you by your health care provider.  Make sure you discuss any questions you have with your health care provider. Document Revised: 07/14/2021 Document Reviewed: 07/14/2021 Elsevier Patient Education  2023 Elsevier Inc.  

## 2022-05-25 ENCOUNTER — Encounter: Payer: Self-pay | Admitting: Urology

## 2022-05-25 ENCOUNTER — Ambulatory Visit (HOSPITAL_COMMUNITY)
Admission: RE | Admit: 2022-05-25 | Discharge: 2022-05-25 | Disposition: A | Discharge status: Home / Self Care | Payer: BC Managed Care – PPO | Source: Ambulatory Visit | Attending: Urology | Admitting: Urology

## 2022-05-25 ENCOUNTER — Ambulatory Visit (INDEPENDENT_AMBULATORY_CARE_PROVIDER_SITE_OTHER): Payer: BC Managed Care – PPO | Admitting: Urology

## 2022-05-25 VITALS — BP 138/79 | HR 76 | Ht 76.0 in | Wt 260.0 lb

## 2022-05-25 DIAGNOSIS — N50819 Testicular pain, unspecified: Secondary | ICD-10-CM | POA: Diagnosis not present

## 2022-05-25 DIAGNOSIS — N50812 Left testicular pain: Secondary | ICD-10-CM

## 2022-05-25 MED ORDER — DICLOFENAC SODIUM 75 MG PO TBEC: 75.0000 mg | DELAYED_RELEASE_TABLET | Freq: Two times a day (BID) | ORAL | 3 refills | Status: DC

## 2022-05-25 NOTE — Progress Notes (Signed)
05/25/2022 9:37 AM   [name] [birth year] [id number]  Referring provider: [name], MD [place],  [place]  Left testicular pain   HPI: Chase Wyatt is a 51yo here for followup for left testicular pain. He continues to have left testicular and epididymis sensitivity. He has intermittent pain with palpation.  Scrotal US from today shows normal bilateral testis and epididymis. Normal doppler waveforms.    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   BACK SURGERY     multiple   CARDIAC CATHETERIZATION  2013   [place], no stents   CHOLECYSTECTOMY  10/28/2011   Procedure: LAPAROSCOPIC CHOLECYSTECTOMY;  Surgeon: Jamesetta So;  Location: [facility];  Service: General;  Laterality: N/A;   INGUINAL HERNIA REPAIR Left 08/07/2020   Procedure: HERNIA REPAIR INGUINAL ADULT using MESH MARLEX PLUG MEDIUM;  Surgeon: Aviva Signs, MD;  Location: [facility];  Service: General;  Laterality: Left;   INGUINAL HERNIA REPAIR Right 01/11/2021   Procedure: HERNIA REPAIR INGUINAL ADULT;  Surgeon: Aviva Signs, MD;  Location: [facility];  Service: General;  Laterality: Right;   LAMINECTOMY THORACIC SPINE W/ PLACEMENT SPINAL CORD STIMULATOR     SHOULDER OPEN ROTATOR CUFF REPAIR Left    [place]   UMBILICAL HERNIA REPAIR N/A 08/07/2020   Procedure: HERNIA REPAIR UMBILICAL ADULT;  Surgeon: Aviva Signs, MD;  Location: [facility];  Service: General;  Laterality: N/A;   VARICOCELECTOMY Right 06/10/2021   Procedure: right, neurolysis-subinguial;  Surgeon: Cleon Gustin, MD;  Location: [facility];  Service: Urology;  Laterality: Right;   VARICOCELECTOMY Right 09/02/2021   Procedure: VARICOCELECTOMY-right subingunial neurolysis;  Surgeon: Cleon Gustin, MD;  Location: [facility];  Service: Urology;  Laterality: Right;   VARICOCELECTOMY Left 04/14/2022   Procedure: VARICOCELECTOMY-left neurolysis;  Surgeon: Cleon Gustin, MD;  Location: [facility];  Service: Urology;  Laterality: Left;   VASECTOMY N/A 09/02/2021   Procedure: VASECTOMY;  Surgeon: Cleon Gustin, MD;  Location: [facility];  Service: Urology;  Laterality: N/A;    Home Medications:  Allergies as of 05/25/2022       Reactions   Benzoin Rash   Blisters   Freederm Adhesive Remover [new Skin] Itching   Meloxicam Hives   Oxycontin [oxycodone] Other (See Comments)   Headache        Medication List        Accurate as of May 25, 2022  9:37 AM. If you have any questions, ask your nurse or doctor.          ALPRAZolam 0.5 MG tablet Commonly known as: XANAX Take 0.5 mg by mouth daily as needed for anxiety.   aspirin EC 81 MG tablet Take 81 mg by mouth daily. Swallow whole.   cetirizine 10 MG tablet Commonly known as: ZYRTEC Take 10 mg by mouth daily.   doxycycline 100 MG capsule Commonly known as: VIBRAMYCIN Take 1 capsule (100 mg total) by mouth every 12 (  twelve) hours.   eletriptan 40 MG tablet Commonly known as: RELPAX Take 40 mg by mouth daily as needed for migraine.   fluticasone 50 MCG/ACT nasal spray Commonly known as: FLONASE Place 1 spray into both nostrils daily.   gabapentin 100 MG capsule Commonly known as: Neurontin Take 1 capsule (100 mg total) by mouth 3 (three) times daily.   HYDROcodone-acetaminophen 5-325 MG tablet Commonly known as: Norco Take 1 tablet by mouth every 6 (six) hours as needed for moderate pain.   ibuprofen 200 MG tablet Commonly known as: ADVIL Take 400 mg by mouth every 6 (six) hours as needed for headache.   multivitamin  tablet Take 1 tablet by mouth daily.   naproxen 500 MG tablet Commonly known as: NAPROSYN Take 500 mg by mouth daily as needed for migraine.   omeprazole 40 MG capsule Commonly known as: PRILOSEC Take 40 mg by mouth daily.   ondansetron 4 MG tablet Commonly known as: ZOFRAN Take 4 mg by mouth every 8 (eight) hours as needed (migraine related nausea).   sulfamethoxazole-trimethoprim 800-160 MG tablet Commonly known as: BACTRIM DS Take 1 tablet by mouth every 12 (twelve) hours.   tamsulosin 0.4 MG Caps capsule Commonly known as: FLOMAX Take 0.4 mg by mouth at bedtime.   testosterone cypionate 200 MG/ML injection Commonly known as: DEPOTESTOSTERONE CYPIONATE Inject 400 mg into the muscle every 14 (fourteen) days.   valACYclovir 1000 MG tablet Commonly known as: VALTREX Take 1,000 mg by mouth daily as needed (fever blisters).   zolpidem 10 MG tablet Commonly known as: AMBIEN Take 10 mg by mouth at bedtime.        Allergies:  Allergies  Allergen Reactions   Benzoin Rash    Blisters   Freederm Adhesive Remover [New Skin] Itching   Meloxicam Hives   Oxycontin [Oxycodone] Other (See Comments)    Headache    Family History: Family History  Problem Relation Age of Onset   Hypotension Neg Hx    Anesthesia problems Neg Hx    Malignant hyperthermia Neg Hx    Pseudochol deficiency Neg Hx     Social History:  reports that he has never smoked. He has quit using smokeless tobacco. He reports current alcohol use of about 1.0 standard drink per week. He reports that he does not use drugs.  ROS: All other review of systems were reviewed and are negative except what is noted above in HPI  Physical Exam: BP 138/79   Pulse 76   Ht '6\' 4"'$  (1.93 m)   Wt 260 lb (117.9 kg)   BMI 31.65 kg/m   Constitutional:  Alert and oriented, No acute distress. HEENT: [place] AT, moist mucus membranes.  Trachea midline, no masses. Cardiovascular: No clubbing, cyanosis, or  edema. Respiratory: Normal respiratory effort, no increased work of breathing. GI: Abdomen is soft, nontender, nondistended, no abdominal masses GU: No CVA tenderness.  Lymph: No cervical or inguinal lymphadenopathy. Skin: No rashes, bruises or suspicious lesions. Neurologic: Grossly intact, no focal deficits, moving all 4 extremities. Psychiatric: Normal mood and affect.  Laboratory Data: Lab Results  Component Value Date   WBC 6.3 08/31/2021   HGB 16.0 08/31/2021   HCT 47.9 08/31/2021   MCV 89.2 08/31/2021   PLT 150 08/31/2021    Lab Results  Component Value Date   CREATININE 1.32 (H) 08/31/2021    No results found for: PSA  No results found for: TESTOSTERONE  No results found for: HGBA1C  Urinalysis    Component  Value Date/Time   APPEARANCEUR Clear 05/06/2022 1234   GLUCOSEU Negative 05/06/2022 1234   BILIRUBINUR Negative 05/06/2022 1234   PROTEINUR Negative 05/06/2022 1234   NITRITE Negative 05/06/2022 1234   LEUKOCYTESUR Negative 05/06/2022 1234    Lab Results  Component Value Date   LABMICR Comment 05/06/2022    Pertinent Imaging: Scrotal US today: Images reviewed and discussed with the patient  No results found for this or any previous visit.  No results found for this or any previous visit.  No results found for this or any previous visit.  No results found for this or any previous visit.  No results found for this or any previous visit.  No results found for this or any previous visit.  No results found for this or any previous visit.  No results found for this or any previous visit.   Assessment & Plan:    1. Pain in testicle, unspecified laterality -Diclofenac '75mg'$  BID. RTC 4 weeks   No follow-ups on file.  [name], MD  [facility] [place]

## 2022-06-08 ENCOUNTER — Ambulatory Visit (INDEPENDENT_AMBULATORY_CARE_PROVIDER_SITE_OTHER): Payer: BC Managed Care – PPO | Admitting: Physician Assistant

## 2022-06-08 VITALS — BP 163/91 | HR 73 | Ht 76.0 in | Wt 260.0 lb

## 2022-06-08 DIAGNOSIS — N5082 Scrotal pain: Secondary | ICD-10-CM

## 2022-06-08 DIAGNOSIS — N50819 Testicular pain, unspecified: Secondary | ICD-10-CM

## 2022-06-08 DIAGNOSIS — L290 Pruritus ani: Secondary | ICD-10-CM

## 2022-06-08 DIAGNOSIS — N410 Acute prostatitis: Secondary | ICD-10-CM

## 2022-06-08 DIAGNOSIS — N4281 Prostatodynia syndrome: Secondary | ICD-10-CM

## 2022-06-08 DIAGNOSIS — N50812 Left testicular pain: Secondary | ICD-10-CM

## 2022-06-08 LAB — URINALYSIS, ROUTINE W REFLEX MICROSCOPIC
Bilirubin, UA: NEGATIVE
Glucose, UA: NEGATIVE
Ketones, UA: NEGATIVE
Nitrite, UA: NEGATIVE
Protein,UA: NEGATIVE
Specific Gravity, UA: 1.02 (ref 1.005–1.030)
Urobilinogen, Ur: 0.2 mg/dL (ref 0.2–1.0)
pH, UA: 7 (ref 5.0–7.5)

## 2022-06-08 LAB — MICROSCOPIC EXAMINATION
Bacteria, UA: NONE SEEN
Epithelial Cells (non renal): NONE SEEN /hpf (ref 0–10)
Renal Epithel, UA: NONE SEEN /hpf

## 2022-06-08 MED ORDER — DOXYCYCLINE HYCLATE 100 MG PO CAPS: 100.0000 mg | ORAL_CAPSULE | Freq: Two times a day (BID) | ORAL | 1 refills | Status: DC

## 2022-06-08 MED ORDER — CLOTRIMAZOLE-BETAMETHASONE 1-0.05 % EX CREA: 1.0000 | TOPICAL_CREAM | Freq: Two times a day (BID) | CUTANEOUS | 0 refills | Status: DC

## 2022-06-08 NOTE — Progress Notes (Signed)
Assessment: 1. Acute prostatitis  2. Scrotal pain - Urinalysis, Routine w reflex microscopic  3. Pain in testicle, unspecified laterality  4. Anal itching    Plan: Doxycycline for 3 weeks to tx prostatitis. Ses if antibx discussed, including photosensitivity. Clotrimazole cream Rx for rectal sxs and pt advised to FU with PCP if sxs persist or if pruritis becomes painful. Offered to resume Rx for gabapentin for saddle/ongoing groin/scrotal pain and pt declines at this time. Continue ibuprofen/acetaminophen for pain and keep FU as scheduled with Dr. Alyson Ingles next month.  Chief Complaint: No chief complaint on file.   HPI: Chase Wyatt is a 51 y.o. male who presents for continued evaluation of scrotal pain, saddle tingling and discomfort, chronic rectal itching without relief using otc diaper creams, and concern for prostatitis. No h/o hemorrhoids or anal issues in the past. Pt states he is leaving for vacation in a few days and is hoping for some relief of his ongoing sxs for the trip. No new swelling, tenderness, redness, or other skin changes. No fever, chills, NV. No gross hematuria. Pt does admit to feeling "run down" and achey since onset of scrotal sxs, but no increase in sxs. Pt denies h/o prostatitis. No dysuria, frequency, urgency, incontinence, imcomplete emptying. Burning on occasion since onset of scrotal sxs in past. No worse.  05/25/22 Chase Wyatt is a 51yo here for followup for left testicular pain. He continues to have left testicular and epididymis sensitivity. He has intermittent pain with palpation.  Scrotal US from today shows normal bilateral testis and epididymis. Normal doppler waveforms.  Portions of the above documentation were copied from a prior visit for review purposes only.  Allergies: Allergies  Allergen Reactions   Benzoin Rash    Blisters   Freederm Adhesive Remover [New Skin] Itching   Meloxicam Hives   Oxycontin [Oxycodone] Other (See Comments)     Headache    PMH: Past Medical History:  Diagnosis Date   Anxiety    Arthritis    GERD (gastroesophageal reflux disease)    Heart murmur    Hypertension    Joint pain    Neuromuscular disorder ([place])    nerve damage right leg   PONV (postoperative nausea and vomiting)    Sleep apnea     PSH: Past Surgical History:  Procedure Laterality Date   ANKLE DEBRIDEMENT  3 yrs ago -[place]   left   ANKLE SURGERY  2005   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   BACK SURGERY     multiple   CARDIAC CATHETERIZATION  2013   [place], no stents   CHOLECYSTECTOMY  10/28/2011   Procedure: LAPAROSCOPIC CHOLECYSTECTOMY;  Surgeon: Jamesetta So;  Location: [facility];  Service: General;  Laterality: N/A;   INGUINAL HERNIA REPAIR Left 08/07/2020   Procedure: HERNIA REPAIR INGUINAL ADULT using MESH MARLEX PLUG MEDIUM;  Surgeon: Aviva Signs, MD;  Location: [facility];  Service: General;  Laterality: Left;   INGUINAL HERNIA REPAIR Right 01/11/2021   Procedure: HERNIA REPAIR INGUINAL ADULT;  Surgeon: Aviva Signs, MD;  Location: [facility];  Service: General;  Laterality: Right;   LAMINECTOMY THORACIC SPINE W/ PLACEMENT SPINAL CORD STIMULATOR     SHOULDER OPEN ROTATOR CUFF REPAIR Left    [place]   UMBILICAL HERNIA REPAIR N/A 08/07/2020   Procedure: HERNIA REPAIR UMBILICAL ADULT;  Surgeon: Aviva Signs, MD;  Location: [facility];  Service: General;  Laterality: N/A;   VARICOCELECTOMY Right 06/10/2021   Procedure: right, neurolysis-subinguial;  Surgeon: Cleon Gustin, MD;  Location: [facility];  Service: Urology;  Laterality: Right;   VARICOCELECTOMY Right 09/02/2021   Procedure: VARICOCELECTOMY-right subingunial neurolysis;  Surgeon: Cleon Gustin, MD;  Location: [facility];  Service: Urology;  Laterality: Right;   VARICOCELECTOMY Left 04/14/2022   Procedure: VARICOCELECTOMY-left neurolysis;  Surgeon: Cleon Gustin, MD;  Location: [facility];  Service: Urology;  Laterality: Left;   VASECTOMY N/A 09/02/2021    Procedure: VASECTOMY;  Surgeon: Cleon Gustin, MD;  Location: [facility];  Service: Urology;  Laterality: N/A;    SH: Social History   Tobacco Use   Smoking status: Never   Smokeless tobacco: Former  Substance Use Topics   Alcohol use: Yes    Alcohol/week: 1.0 standard drink of alcohol    Types: 1 Cans of beer per week    Comment: occassional   Drug use: No    ROS: All other review of systems were reviewed and are negative except what is noted above in HPI  PE: BP (!) 163/91   Pulse 73   Ht '6\' 4"'$  (1.93 m)   Wt 260 lb (117.9 kg)   BMI 31.65 kg/m  GENERAL APPEARANCE:  Well appearing, well developed, well nourished, NAD HEENT:  Atraumatic, normocephalic NECK:  Supple. Trachea midline ABDOMEN:  Soft, non-tender, no masses GU: Circumcised phallus, Mildly tender diffusely inguinal rings and scrotum/testis, No inguinal adenopathy. No hernias noted. Prostate- boggy and tender right lobe without masses/induration. No masses or skin breakdown noted in perirectal area. EXTREMITIES:  Moves all extremities well, without clubbing, cyanosis, or edema NEUROLOGIC:  Alert and oriented x 3, normal gait, CN II-XII grossly intact MENTAL STATUS:  appropriate BACK:  Non-tender to palpation, No CVAT SKIN:  Warm, dry, and intact   Results: Laboratory Data: Lab Results  Component Value Date   WBC 6.3 08/31/2021   HGB 16.0 08/31/2021   HCT 47.9 08/31/2021   MCV 89.2 08/31/2021   PLT 150 08/31/2021    Lab Results  Component Value Date   CREATININE 1.32 (H) 08/31/2021     Urinalysis    Component Value Date/Time   APPEARANCEUR Clear 06/08/2022 1638   GLUCOSEU Negative 06/08/2022 1638   BILIRUBINUR Negative 06/08/2022 1638   PROTEINUR Negative 06/08/2022 1638   NITRITE Negative 06/08/2022 1638   LEUKOCYTESUR Trace (A) 06/08/2022 1638    Lab Results  Component Value Date   LABMICR See below: 06/08/2022   WBCUA 6-10 (A) 06/08/2022   LABEPIT None seen 06/08/2022   BACTERIA  None seen 06/08/2022    Pertinent Imaging: No results found for this or any previous visit.  No results found for this or any previous visit.  No results found for this or any previous visit.  No results found for this or any previous visit.  No results found for this or any previous visit.  No results found for this or any previous visit.  No results found for this or any previous visit.  No results found for this or any previous visit.  Results for orders placed or performed in visit on 06/08/22 (from the past 24 hour(s))  Microscopic Examination   Collection Time: 06/08/22  4:38 PM   Urine  Result Value Ref Range   WBC, UA 6-10 (A) 0 - 5 /hpf   RBC 0-2 0 - 2 /hpf   Epithelial Cells (non renal) None seen 0 - 10 /hpf   Renal Epithel, UA None seen None seen /hpf   Bacteria, UA None seen None  seen/Few  Urinalysis, Routine w reflex microscopic   Collection Time: 06/08/22  4:38 PM  Result Value Ref Range   Specific Gravity, UA 1.020 1.005 - 1.030   pH, UA 7.0 5.0 - 7.5   Color, UA Yellow Yellow   Appearance Ur Clear Clear   Leukocytes,UA Trace (A) Negative   Protein,UA Negative Negative/Trace   Glucose, UA Negative Negative   Ketones, UA Negative Negative   RBC, UA Trace (A) Negative   Bilirubin, UA Negative Negative   Urobilinogen, Ur 0.2 0.2 - 1.0 mg/dL   Nitrite, UA Negative Negative   Microscopic Examination See below:

## 2022-06-15 ENCOUNTER — Other Ambulatory Visit: Payer: Self-pay

## 2022-06-22 ENCOUNTER — Ambulatory Visit (INDEPENDENT_AMBULATORY_CARE_PROVIDER_SITE_OTHER): Payer: BC Managed Care – PPO | Admitting: Urology

## 2022-06-22 VITALS — BP 146/84 | HR 52

## 2022-06-22 DIAGNOSIS — N50819 Testicular pain, unspecified: Secondary | ICD-10-CM

## 2022-06-22 DIAGNOSIS — N410 Acute prostatitis: Secondary | ICD-10-CM

## 2022-06-22 DIAGNOSIS — N50812 Left testicular pain: Secondary | ICD-10-CM

## 2022-06-22 LAB — URINALYSIS, ROUTINE W REFLEX MICROSCOPIC
Bilirubin, UA: NEGATIVE
Glucose, UA: NEGATIVE
Leukocytes,UA: NEGATIVE
Nitrite, UA: NEGATIVE
RBC, UA: NEGATIVE
Specific Gravity, UA: 1.025 (ref 1.005–1.030)
Urobilinogen, Ur: 1 mg/dL (ref 0.2–1.0)
pH, UA: 6 (ref 5.0–7.5)

## 2022-06-22 NOTE — H&P (View-Only) (Signed)
06/22/2022 8:46 AM   [name] [birth year] [id number]  Referring provider: [name], MD [place],  [place]  Followup left testis pain and prostatitis   HPI: [name] is a 51yo here for followup for left testis pain and prostatitis. His prostatitis symptoms are better after 3 weeks of doxycycline. He continues to have left epididymal pain that is refractory to NSAIDS, gabapentin, narcotics.   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   BACK SURGERY     multiple   CARDIAC CATHETERIZATION  2013   [place], no stents   CHOLECYSTECTOMY  10/28/2011   Procedure: LAPAROSCOPIC CHOLECYSTECTOMY;  Surgeon: Chase Wyatt;  Location: [facility];  Service: General;  Laterality: N/A;   INGUINAL HERNIA REPAIR Left 08/07/2020   Procedure: HERNIA REPAIR INGUINAL ADULT using MESH MARLEX PLUG MEDIUM;  Surgeon: Chase Signs, MD;  Location: [facility];  Service: General;  Laterality: Left;   INGUINAL HERNIA REPAIR Right 01/11/2021   Procedure: HERNIA REPAIR INGUINAL ADULT;  Surgeon: Chase Signs, MD;  Location: [facility];  Service: General;  Laterality: Right;   LAMINECTOMY THORACIC SPINE W/ PLACEMENT SPINAL CORD STIMULATOR     SHOULDER OPEN ROTATOR CUFF REPAIR Left    [place]   UMBILICAL HERNIA REPAIR N/A 08/07/2020   Procedure: HERNIA REPAIR UMBILICAL ADULT;  Surgeon: Chase Signs, MD;  Location: [facility];  Service: General;  Laterality: N/A;   VARICOCELECTOMY Right 06/10/2021   Procedure: right, neurolysis-subinguial;  Surgeon: Chase Gustin, MD;   Location: [facility];  Service: Urology;  Laterality: Right;   VARICOCELECTOMY Right 09/02/2021   Procedure: VARICOCELECTOMY-right subingunial neurolysis;  Surgeon: Chase Gustin, MD;  Location: [facility];  Service: Urology;  Laterality: Right;   VARICOCELECTOMY Left 04/14/2022   Procedure: VARICOCELECTOMY-left neurolysis;  Surgeon: Chase Gustin, MD;  Location: [facility];  Service: Urology;  Laterality: Left;   VASECTOMY N/A 09/02/2021   Procedure: VASECTOMY;  Surgeon: Chase Gustin, MD;  Location: [facility];  Service: Urology;  Laterality: N/A;    Home Medications:  Allergies as of 06/22/2022       Reactions   Benzoin Rash   Blisters   Freederm Adhesive Remover [new Skin] Itching   Meloxicam Hives   Oxycontin [oxycodone] Other (See Comments)   Headache        Medication List        Accurate as of June 22, 2022  8:46 AM. If you have any questions, ask your nurse or doctor.          ALPRAZolam 0.5 MG tablet Commonly known as: XANAX Take 0.5 mg by mouth daily as needed for anxiety.   aspirin EC 81 MG tablet Take 81 mg by mouth daily. Swallow whole.   cetirizine 10 MG tablet Commonly known as: ZYRTEC Take 10 mg by mouth daily.   clotrimazole-betamethasone cream Commonly known as: Lotrisone Apply 1 Application topically 2 (two) times daily.   diclofenac 75 MG  EC tablet Commonly known as: VOLTAREN Take 1 tablet (75 mg total) by mouth 2 (two) times daily.   doxycycline 100 MG capsule Commonly known as: VIBRAMYCIN Take 1 capsule (100 mg total) by mouth every 12 (twelve) hours.   eletriptan 40 MG tablet Commonly known as: RELPAX Take 40 mg by mouth daily as needed for migraine.   fluticasone 50 MCG/ACT nasal spray Commonly known as: FLONASE Place 1 spray into both nostrils daily.   ibuprofen 200 MG tablet Commonly known as: ADVIL Take 400 mg by mouth every 6 (six) hours as needed for headache.   multivitamin tablet Take 1 tablet by mouth daily.    naproxen 500 MG tablet Commonly known as: NAPROSYN Take 500 mg by mouth daily as needed for migraine.   omeprazole 40 MG capsule Commonly known as: PRILOSEC Take 40 mg by mouth daily.   ondansetron 4 MG tablet Commonly known as: ZOFRAN Take 4 mg by mouth every 8 (eight) hours as needed (migraine related nausea).   testosterone cypionate 200 MG/ML injection Commonly known as: DEPOTESTOSTERONE CYPIONATE Inject 400 mg into the muscle every 14 (fourteen) days.   valACYclovir 1000 MG tablet Commonly known as: VALTREX Take 1,000 mg by mouth daily as needed (fever blisters).   zolpidem 10 MG tablet Commonly known as: AMBIEN Take 10 mg by mouth at bedtime.        Allergies:  Allergies  Allergen Reactions   Benzoin Rash    Blisters   Freederm Adhesive Remover [New Skin] Itching   Meloxicam Hives   Oxycontin [Oxycodone] Other (See Comments)    Headache    Family History: Family History  Problem Relation Age of Onset   Hypotension Neg Hx    Anesthesia problems Neg Hx    Malignant hyperthermia Neg Hx    Pseudochol deficiency Neg Hx     Social History:  reports that he has never smoked. He has quit using smokeless tobacco. He reports current alcohol use of about 1.0 standard drink of alcohol per week. He reports that he does not use drugs.  ROS: All other review of systems were reviewed and are negative except what is noted above in HPI  Physical Exam: BP (!) 146/84   Pulse (!) 52   Constitutional:  Alert and oriented, No acute distress. HEENT: [place] AT, moist mucus membranes.  Trachea midline, no masses. Cardiovascular: No clubbing, cyanosis, or edema. Respiratory: Normal respiratory effort, no increased work of breathing. GI: Abdomen is soft, nontender, nondistended, no abdominal masses GU: No CVA tenderness.  Lymph: No cervical or inguinal lymphadenopathy. Skin: No rashes, bruises or suspicious lesions. Neurologic: Grossly intact, no focal deficits, moving all 4  extremities. Psychiatric: Normal mood and affect.  Laboratory Data: Lab Results  Component Value Date   WBC 6.3 08/31/2021   HGB 16.0 08/31/2021   HCT 47.9 08/31/2021   MCV 89.2 08/31/2021   PLT 150 08/31/2021    Lab Results  Component Value Date   CREATININE 1.32 (H) 08/31/2021    No results found for: "PSA"  No results found for: "TESTOSTERONE"  No results found for: "HGBA1C"  Urinalysis    Component Value Date/Time   APPEARANCEUR Clear 06/08/2022 1638   GLUCOSEU Negative 06/08/2022 1638   BILIRUBINUR Negative 06/08/2022 1638   PROTEINUR Negative 06/08/2022 1638   NITRITE Negative 06/08/2022 1638   LEUKOCYTESUR Trace (A) 06/08/2022 1638    Lab Results  Component Value Date   LABMICR See below: 06/08/2022   WBCUA 6-10 (A) 06/08/2022  LABEPIT None seen 06/08/2022   BACTERIA None seen 06/08/2022    Pertinent Imaging:  No results found for this or any previous visit.  No results found for this or any previous visit.  No results found for this or any previous visit.  No results found for this or any previous visit.  No results found for this or any previous visit.  No results found for this or any previous visit.  No results found for this or any previous visit.  No results found for this or any previous visit.   Assessment & Plan:    1. Acute prostatitis -resolved - Urinalysis, Routine w reflex microscopic  2. Pain in testicle, unspecified laterality We discussed the management of chronic epididymitis and chronic epididymal pain including cord block and epididymectomy. After discussing the options the patient elects for left epididymectomy. Risks/benefits/alternatives discussed.    No follow-ups on file.  [name], MD  [facility] [place]

## 2022-06-22 NOTE — Progress Notes (Signed)
06/22/2022 8:46 AM   Chase Wyatt 1971-07-02 [id number]  Referring provider: [name], MD [place],  [place]  Followup left testis pain and prostatitis   HPI: Chase Wyatt is a 51yo here for followup for left testis pain and prostatitis. His prostatitis symptoms are better after 3 weeks of doxycycline. He continues to have left epididymal pain that is refractory to NSAIDS, gabapentin, narcotics.   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   BACK SURGERY     multiple   CARDIAC CATHETERIZATION  2013   [place], no stents   CHOLECYSTECTOMY  10/28/2011   Procedure: LAPAROSCOPIC CHOLECYSTECTOMY;  Surgeon: Jamesetta So;  Location: [facility];  Service: General;  Laterality: N/A;   INGUINAL HERNIA REPAIR Left 08/07/2020   Procedure: HERNIA REPAIR INGUINAL ADULT using MESH MARLEX PLUG MEDIUM;  Surgeon: Aviva Signs, MD;  Location: [facility];  Service: General;  Laterality: Left;   INGUINAL HERNIA REPAIR Right 01/11/2021   Procedure: HERNIA REPAIR INGUINAL ADULT;  Surgeon: Aviva Signs, MD;  Location: [facility];  Service: General;  Laterality: Right;   LAMINECTOMY THORACIC SPINE W/ PLACEMENT SPINAL CORD STIMULATOR     SHOULDER OPEN ROTATOR CUFF REPAIR Left    [place]   UMBILICAL HERNIA REPAIR N/A 08/07/2020   Procedure: HERNIA REPAIR UMBILICAL ADULT;  Surgeon: Aviva Signs, MD;  Location: [facility];  Service: General;  Laterality: N/A;   VARICOCELECTOMY Right 06/10/2021   Procedure: right, neurolysis-subinguial;  Surgeon: Cleon Gustin, MD;   Location: [facility];  Service: Urology;  Laterality: Right;   VARICOCELECTOMY Right 09/02/2021   Procedure: VARICOCELECTOMY-right subingunial neurolysis;  Surgeon: Cleon Gustin, MD;  Location: [facility];  Service: Urology;  Laterality: Right;   VARICOCELECTOMY Left 04/14/2022   Procedure: VARICOCELECTOMY-left neurolysis;  Surgeon: Cleon Gustin, MD;  Location: [facility];  Service: Urology;  Laterality: Left;   VASECTOMY N/A 09/02/2021   Procedure: VASECTOMY;  Surgeon: Cleon Gustin, MD;  Location: [facility];  Service: Urology;  Laterality: N/A;    Home Medications:  Allergies as of 06/22/2022       Reactions   Benzoin Rash   Blisters   Freederm Adhesive Remover [new Skin] Itching   Meloxicam Hives   Oxycontin [oxycodone] Other (See Comments)   Headache        Medication List        Accurate as of June 22, 2022  8:46 AM. If you have any questions, ask your nurse or doctor.          ALPRAZolam 0.5 MG tablet Commonly known as: XANAX Take 0.5 mg by mouth daily as needed for anxiety.   aspirin EC 81 MG tablet Take 81 mg by mouth daily. Swallow whole.   cetirizine 10 MG tablet Commonly known as: ZYRTEC Take 10 mg by mouth daily.   clotrimazole-betamethasone cream Commonly known as: Lotrisone Apply 1 Application topically 2 (two) times daily.   diclofenac 75 MG  EC tablet Commonly known as: VOLTAREN Take 1 tablet (75 mg total) by mouth 2 (two) times daily.   doxycycline 100 MG capsule Commonly known as: VIBRAMYCIN Take 1 capsule (100 mg total) by mouth every 12 (twelve) hours.   eletriptan 40 MG tablet Commonly known as: RELPAX Take 40 mg by mouth daily as needed for migraine.   fluticasone 50 MCG/ACT nasal spray Commonly known as: FLONASE Place 1 spray into both nostrils daily.   ibuprofen 200 MG tablet Commonly known as: ADVIL Take 400 mg by mouth every 6 (six) hours as needed for headache.   multivitamin tablet Take 1 tablet by mouth daily.    naproxen 500 MG tablet Commonly known as: NAPROSYN Take 500 mg by mouth daily as needed for migraine.   omeprazole 40 MG capsule Commonly known as: PRILOSEC Take 40 mg by mouth daily.   ondansetron 4 MG tablet Commonly known as: ZOFRAN Take 4 mg by mouth every 8 (eight) hours as needed (migraine related nausea).   testosterone cypionate 200 MG/ML injection Commonly known as: DEPOTESTOSTERONE CYPIONATE Inject 400 mg into the muscle every 14 (fourteen) days.   valACYclovir 1000 MG tablet Commonly known as: VALTREX Take 1,000 mg by mouth daily as needed (fever blisters).   zolpidem 10 MG tablet Commonly known as: AMBIEN Take 10 mg by mouth at bedtime.        Allergies:  Allergies  Allergen Reactions   Benzoin Rash    Blisters   Freederm Adhesive Remover [New Skin] Itching   Meloxicam Hives   Oxycontin [Oxycodone] Other (See Comments)    Headache    Family History: Family History  Problem Relation Age of Onset   Hypotension Neg Hx    Anesthesia problems Neg Hx    Malignant hyperthermia Neg Hx    Pseudochol deficiency Neg Hx     Social History:  reports that he has never smoked. He has quit using smokeless tobacco. He reports current alcohol use of about 1.0 standard drink of alcohol per week. He reports that he does not use drugs.  ROS: All other review of systems were reviewed and are negative except what is noted above in HPI  Physical Exam: BP (!) 146/84   Pulse (!) 52   Constitutional:  Alert and oriented, No acute distress. HEENT: [place] AT, moist mucus membranes.  Trachea midline, no masses. Cardiovascular: No clubbing, cyanosis, or edema. Respiratory: Normal respiratory effort, no increased work of breathing. GI: Abdomen is soft, nontender, nondistended, no abdominal masses GU: No CVA tenderness.  Lymph: No cervical or inguinal lymphadenopathy. Skin: No rashes, bruises or suspicious lesions. Neurologic: Grossly intact, no focal deficits, moving all 4  extremities. Psychiatric: Normal mood and affect.  Laboratory Data: Lab Results  Component Value Date   WBC 6.3 08/31/2021   HGB 16.0 08/31/2021   HCT 47.9 08/31/2021   MCV 89.2 08/31/2021   PLT 150 08/31/2021    Lab Results  Component Value Date   CREATININE 1.32 (H) 08/31/2021    No results found for: "PSA"  No results found for: "TESTOSTERONE"  No results found for: "HGBA1C"  Urinalysis    Component Value Date/Time   APPEARANCEUR Clear 06/08/2022 1638   GLUCOSEU Negative 06/08/2022 1638   BILIRUBINUR Negative 06/08/2022 1638   PROTEINUR Negative 06/08/2022 1638   NITRITE Negative 06/08/2022 1638   LEUKOCYTESUR Trace (A) 06/08/2022 1638    Lab Results  Component Value Date   LABMICR See below: 06/08/2022   WBCUA 6-10 (A) 06/08/2022  LABEPIT None seen 06/08/2022   BACTERIA None seen 06/08/2022    Pertinent Imaging:  No results found for this or any previous visit.  No results found for this or any previous visit.  No results found for this or any previous visit.  No results found for this or any previous visit.  No results found for this or any previous visit.  No results found for this or any previous visit.  No results found for this or any previous visit.  No results found for this or any previous visit.   Assessment & Plan:    1. Acute prostatitis -resolved - Urinalysis, Routine w reflex microscopic  2. Pain in testicle, unspecified laterality We discussed the management of chronic epididymitis and chronic epididymal pain including cord block and epididymectomy. After discussing the options the patient elects for left epididymectomy. Risks/benefits/alternatives discussed.    No follow-ups on file.  [name], MD  [facility] [place]

## 2022-06-27 ENCOUNTER — Telehealth: Payer: Self-pay

## 2022-06-27 NOTE — Telephone Encounter (Signed)
Patient called inquiring if he can have both the left and right side done at the same time for his groin pain since he is having pain all the way around. Please advise.

## 2022-06-28 ENCOUNTER — Encounter: Payer: Self-pay | Admitting: Urology

## 2022-06-28 NOTE — Patient Instructions (Signed)
Epididymectomy An epididymectomy is a surgery to remove the epididymis. The epididymis is a tiny, tightly coiled tube that stores sperm. Males have two of these tubes, one over each of the testicles. The goal of this procedure is to relieve long-term (chronic) pain in a testicle when the pain cannot be controlled by medicine. This procedure may be done if there is: An injury to the groin. An infection or pus-filled lump (abscess). A fluid-filled sac (cyst). A tumor. Pain that does not go away, such as after a vasectomy. Tell a health care provider about: Any allergies you have. All medicines you are taking, including vitamins, herbs, eye drops, creams, and over-the-counter medicines. Any problems you or family members have had with anesthetic medicines. Any bleeding problems you have. Any surgeries you have had. Any medical conditions you have. What are the risks? Generally, this is a safe procedure. However, problems may occur, including: Blood collecting inside the sac that holds your testicles (scrotum). Allergic reactions to medicines. Infection. Damage to nearby structures or organs, including shrinking of your testicle. Being unable to have children (being infertile). What happens before the procedure? When to stop eating and drinking Follow instructions from your health care provider about what you may eat and drink before your procedure. These may include: 8 hours before your procedure Stop eating most foods. Do not eat meat, fried foods, or fatty foods. Eat only light foods, such as toast or crackers. All liquids are okay except energy drinks and alcohol. 6 hours before your procedure Stop eating. Drink only clear liquids, such as water, clear fruit juice, black coffee, plain tea, and sports drinks. Do not drink energy drinks or alcohol. 2 hours before your procedure Stop drinking all liquids. You may be allowed to take medicines with small sips of water. If you do not  follow your health care provider's instructions, your procedure may be delayed or canceled. Medicines Ask your health care provider about: Changing or stopping your regular medicines. This is especially important if you are taking diabetes medicines or blood thinners. Taking medicines such as aspirin and ibuprofen. These medicines can thin your blood. Do not take these medicines unless your health care provider tells you to take them. Taking over-the-counter medicines, vitamins, herbs, and supplements. Surgery safety Ask your health care provider: How your surgery site will be marked. What steps will be taken to help prevent infection. These steps may include: Removing hair at the surgery site. Washing skin with a germ-killing soap. Taking antibiotic medicine. General instructions Do not use any products that contain nicotine or tobacco for at least 4 weeks before the procedure. These products include cigarettes, chewing tobacco, and vaping devices, such as e-cigarettes. If you need help quitting, ask your health care provider. A complete history and physical exam will be done. You will be asked about any previous injury (trauma) or infections of your genitals. If you will be going home right after the procedure, plan to have a responsible adult: Take you home from the hospital or clinic. You will not be allowed to drive. Care for you for the time you are told. What happens during the procedure? An IV will be inserted into one of your veins. You will be given one or more of the following: A medicine to help you relax (sedative). A medicine to make you fall asleep (general anesthetic). An incision will be made in your scrotum. Your testicle and epididymis will be pulled through the incision. The epididymis will be separated from your  testicle and removed. Your testicle will be put back into your scrotum. A small drain may be placed in your incision. The drain allows extra blood or fluid to  flow out of your scrotum after the procedure. The incision will be closed with stitches (sutures). The procedure may vary among health care providers and hospitals. What happens after the procedure? Your blood pressure, heart rate, breathing rate, and blood oxygen level will be monitored until you leave the hospital or clinic. If you have a drain, you may need to stay in the hospital until the drain is removed. If you were given a sedative during the procedure, it can affect you for several hours. Do not drive or operate machinery until your health care provider says that it is safe. Summary The goal of an epididymectomy is to relieve long-term (chronic) pain in a testicle when the pain cannot be controlled by medicine. Follow instructions from your health care provider about what you may eat and drink before your procedure. You may have a drain after this procedure. If you have a drain, you may need to stay in the hospital until the drain is removed. After the procedure, plan to have a responsible adult care for you for the time you are told. This information is not intended to replace advice given to you by your health care provider. Make sure you discuss any questions you have with your health care provider. Document Revised: 07/14/2021 Document Reviewed: 07/14/2021 Elsevier Patient Education  [place].

## 2022-06-30 NOTE — Telephone Encounter (Signed)
Verbal from Dr. Alyson Ingles that he does not advise doing both sides at the same time.  He will do one side at a time to decrease the risk of hematoma and he wants to be sure it is successful.  Patient advised and voiced understanding.

## 2022-07-06 NOTE — Patient Instructions (Signed)
Your procedure is scheduled on: 07/13/2022  Report to [facility] at   6:50  AM.  Call this number if you have problems the morning of surgery: [phone]   Remember:   Do not Eat or Drink after midnight         No Smoking the morning of surgery  :  Take these medicines the morning of surgery with A SIP OF WATER: Xanax, zyrtec, relpax, flonace and/or zofran if needed   Do not wear jewelry, make-up or nail polish.  Do not wear lotions, powders, or perfumes. You may wear deodorant.  Do not shave 48 hours prior to surgery. Men may shave face and neck.  Do not bring valuables to the hospital.  Contacts, dentures or bridgework may not be worn into surgery.  Leave suitcase in the car. After surgery it may be brought to your room.  For patients admitted to the hospital, checkout time is 11:00 AM the day of discharge.   Patients discharged the day of surgery will not be allowed to drive home.    Special Instructions: Shower using CHG night before surgery and shower the day of surgery use CHG.  Use special wash - you have one bottle of CHG for all showers.  You should use approximately 1/2 of the bottle for each shower.  How to Use Chlorhexidine for Bathing Chlorhexidine gluconate (CHG) is a germ-killing (antiseptic) solution that is used to clean the skin. It can get rid of the bacteria that normally live on the skin and can keep them away for about 24 hours. To clean your skin with CHG, you may be given: A CHG solution to use in the shower or as part of a sponge bath. A prepackaged cloth that contains CHG. Cleaning your skin with CHG may help lower the risk for infection: While you are staying in the intensive care unit of the hospital. If you have a vascular access, such as a central line, to provide short-term or long-term access to your veins. If you have a catheter to drain urine from your bladder. If you are on a ventilator. A ventilator is a machine that helps you  breathe by moving air in and out of your lungs. After surgery. What are the risks? Risks of using CHG include: A skin reaction. Hearing loss, if CHG gets in your ears and you have a perforated eardrum. Eye injury, if CHG gets in your eyes and is not rinsed out. The CHG product catching fire. Make sure that you avoid smoking and flames after applying CHG to your skin. Do not use CHG: If you have a chlorhexidine allergy or have previously reacted to chlorhexidine. On babies younger than 49 months of age. How to use CHG solution Use CHG only as told by your health care provider, and follow the instructions on the label. Use the full amount of CHG as directed. Usually, this is one bottle. During a shower Follow these steps when using CHG solution during a shower (unless your health care provider gives you different instructions): Start the shower. Use your normal soap and shampoo to wash your face and hair. Turn off the shower or move out of the shower stream. Pour the CHG onto a clean washcloth. Do not use any type of brush or rough-edged sponge. Starting at your neck, lather your body down to your toes. Make sure you follow these instructions: If you will be having surgery, pay special attention to the part of your body  where you will be having surgery. Scrub this area for at least 1 minute. Do not use CHG on your head or face. If the solution gets into your ears or eyes, rinse them well with water. Avoid your genital area. Avoid any areas of skin that have broken skin, cuts, or scrapes. Scrub your back and under your arms. Make sure to wash skin folds. Let the lather sit on your skin for 1-2 minutes or as long as told by your health care provider. Thoroughly rinse your entire body in the shower. Make sure that all body creases and crevices are rinsed well. Dry off with a clean towel. Do not put any substances on your body afterward--such as powder, lotion, or perfume--unless you are told  to do so by your health care provider. Only use lotions that are recommended by the manufacturer. Put on clean clothes or pajamas. If it is the night before your surgery, sleep in clean sheets.  During a sponge bath Follow these steps when using CHG solution during a sponge bath (unless your health care provider gives you different instructions): Use your normal soap and shampoo to wash your face and hair. Pour the CHG onto a clean washcloth. Starting at your neck, lather your body down to your toes. Make sure you follow these instructions: If you will be having surgery, pay special attention to the part of your body where you will be having surgery. Scrub this area for at least 1 minute. Do not use CHG on your head or face. If the solution gets into your ears or eyes, rinse them well with water. Avoid your genital area. Avoid any areas of skin that have broken skin, cuts, or scrapes. Scrub your back and under your arms. Make sure to wash skin folds. Let the lather sit on your skin for 1-2 minutes or as long as told by your health care provider. Using a different clean, wet washcloth, thoroughly rinse your entire body. Make sure that all body creases and crevices are rinsed well. Dry off with a clean towel. Do not put any substances on your body afterward--such as powder, lotion, or perfume--unless you are told to do so by your health care provider. Only use lotions that are recommended by the manufacturer. Put on clean clothes or pajamas. If it is the night before your surgery, sleep in clean sheets. How to use CHG prepackaged cloths Only use CHG cloths as told by your health care provider, and follow the instructions on the label. Use the CHG cloth on clean, dry skin. Do not use the CHG cloth on your head or face unless your health care provider tells you to. When washing with the CHG cloth: Avoid your genital area. Avoid any areas of skin that have broken skin, cuts, or scrapes. Before  surgery Follow these steps when using a CHG cloth to clean before surgery (unless your health care provider gives you different instructions): Using the CHG cloth, vigorously scrub the part of your body where you will be having surgery. Scrub using a back-and-forth motion for 3 minutes. The area on your body should be completely wet with CHG when you are done scrubbing. Do not rinse. Discard the cloth and let the area air-dry. Do not put any substances on the area afterward, such as powder, lotion, or perfume. Put on clean clothes or pajamas. If it is the night before your surgery, sleep in clean sheets.  For general bathing Follow these steps when using CHG cloths for  general bathing (unless your health care provider gives you different instructions). Use a separate CHG cloth for each area of your body. Make sure you wash between any folds of skin and between your fingers and toes. Wash your body in the following order, switching to a new cloth after each step: The front of your neck, shoulders, and chest. Both of your arms, under your arms, and your hands. Your stomach and groin area, avoiding the genitals. Your right leg and foot. Your left leg and foot. The back of your neck, your back, and your buttocks. Do not rinse. Discard the cloth and let the area air-dry. Do not put any substances on your body afterward--such as powder, lotion, or perfume--unless you are told to do so by your health care provider. Only use lotions that are recommended by the manufacturer. Put on clean clothes or pajamas. Contact a health care provider if: Your skin gets irritated after scrubbing. You have questions about using your solution or cloth. You swallow any chlorhexidine. Call your local poison control center (1-413-534-4101 in the [place] Get help right away if: Your eyes itch badly, or they become very red or swollen. Your skin itches badly and is red or swollen. Your hearing changes. You have trouble  seeing. You have swelling or tingling in your mouth or throat. You have trouble breathing. These symptoms may represent a serious problem that is an emergency. Do not wait to see if the symptoms will go away. Get medical help right away. Call your local emergency services (911 in the [place] Do not drive yourself to the hospital. Summary Chlorhexidine gluconate (CHG) is a germ-killing (antiseptic) solution that is used to clean the skin. Cleaning your skin with CHG may help to lower your risk for infection. You may be given CHG to use for bathing. It may be in a bottle or in a prepackaged cloth to use on your skin. Carefully follow your health care provider's instructions and the instructions on the product label. Do not use CHG if you have a chlorhexidine allergy. Contact your health care provider if your skin gets irritated after scrubbing. This information is not intended to replace advice given to you by your health care provider. Make sure you discuss any questions you have with your health care provider. Document Revised: 02/15/2021 Document Reviewed: 02/15/2021 Elsevier Patient Education  [place] After The following information offers guidance on how to care for yourself after your procedure. Your health care provider may also give you more specific instructions. If you have problems or questions, contact your health care provider. What can I expect after the procedure? After the procedure, it is common to have: Pain and swelling in the area where your incision was made. Some bruising around the penis and the sac that holds your testicles (scrotum). Follow these instructions at home: Medicines Take over-the-counter and prescription medicines only as told by your health care provider. If you were prescribed an antibiotic medicine, take it as told by your health care provider. Do not stop using the antibiotic even if you start to feel better. Eating and  drinking Follow instructions from your health care provider about eating or drinking restrictions. Drink enough fluid to keep your urine pale yellow. Bathing  Do not take baths, swim, or use a hot tub until your health care provider approves. You may shower 24 hours after your procedure. When you shower, gently rinse the scrotum and pat it dry. Do not scrub your scrotum. Incision  care  Check your incision area every day for signs of infection. Check for: More redness, swelling, or pain. Fluid or blood. Warmth. Pus or a bad smell. Leave stitches (sutures) in place. They may need to stay in place for 2 weeks or longer. Usually, the sutures will dissolve and will not need to be removed. Managing pain, stiffness, and swelling If directed, raise (elevate) your scrotum and apply ice. To do this: Put ice in a plastic bag. Place a small towel or pillow between your legs. Rest your scrotum on the pillow or towel. Place another towel between your skin and the plastic bag. Leave the ice on for 20 minutes, 2-3 times a day. Remove the ice if your skin turns bright red. This is very important. If you cannot feel pain, heat, or cold, you have a greater risk of damage to the area. Activity If you were given a sedative during the procedure, it can affect you for several hours. Do not drive or operate machinery until your health care provider says that it is safe. Rest as told by your health care provider. Do not do any sports for about 2 weeks. Do not have sex until your health care provider says it is okay. Return to your normal activities as told by your health care provider. Ask your health care provider what activities are safe for you. General instructions Do not use any products that contain nicotine or tobacco. These products include cigarettes, chewing tobacco, and vaping devices, such as e-cigarettes. These can delay incision healing after surgery. If you need help quitting, ask your health  care provider. Use scrotal support, such as a jockstrap or underwear with a supportive pouch, as told by your health care provider. Keep all follow-up visits. This is important. Contact a health care provider if: You continue to have increased swelling or bruising after 3 days. You have more redness, swelling, or pain around your incision. You have fluid or blood coming from your incision. Your incision feels warm to the touch. You have pus or a bad smell coming from your incision. Get help right away if: You have burning or bleeding when you urinate. You cannot urinate. You have a fever or chills. Summary After an epididymectomy, it is common to have pain and swelling in the area where your incision was made. Rest as told by your health care provider. Do not do any sports for about 2 weeks. Do not have sex until your health care provider says it is okay. Return to your normal activities as told by your health care provider. Ask your health care provider what activities are safe for you. Get help right away if you cannot urinate. This information is not intended to replace advice given to you by your health care provider. Make sure you discuss any questions you have with your health care provider. Document Revised: 07/14/2021 Document Reviewed: 07/14/2021 Elsevier Patient Education  [place] Anesthesia, Adult, Care After This sheet gives you information about how to care for yourself after your procedure. Your health care provider may also give you more specific instructions. If you have problems or questions, contact your health care provider. What can I expect after the procedure? After the procedure, the following side effects are common: Pain or discomfort at the IV site. Nausea. Vomiting. Sore throat. Trouble concentrating. Feeling cold or chills. Feeling weak or tired. Sleepiness and fatigue. Soreness and body aches. These side effects can affect parts of  the body that were  not involved in surgery. Follow these instructions at home: For the time period you were told by your health care provider:  Rest. Do not participate in activities where you could fall or become injured. Do not drive or use machinery. Do not drink alcohol. Do not take sleeping pills or medicines that cause drowsiness. Do not make important decisions or sign legal documents. Do not take care of children on your own. Eating and drinking Follow any instructions from your health care provider about eating or drinking restrictions. When you feel hungry, start by eating small amounts of foods that are soft and easy to digest (bland), such as toast. Gradually return to your regular diet. Drink enough fluid to keep your urine pale yellow. If you vomit, rehydrate by drinking water, juice, or clear broth. General instructions If you have sleep apnea, surgery and certain medicines can increase your risk for breathing problems. Follow instructions from your health care provider about wearing your sleep device: Anytime you are sleeping, including during daytime naps. While taking prescription pain medicines, sleeping medicines, or medicines that make you drowsy. Have a responsible adult stay with you for the time you are told. It is important to have someone help care for you until you are awake and alert. Return to your normal activities as told by your health care provider. Ask your health care provider what activities are safe for you. Take over-the-counter and prescription medicines only as told by your health care provider. If you smoke, do not smoke without supervision. Keep all follow-up visits as told by your health care provider. This is important. Contact a health care provider if: You have nausea or vomiting that does not get better with medicine. You cannot eat or drink without vomiting. You have pain that does not get better with medicine. You are unable to pass  urine. You develop a skin rash. You have a fever. You have redness around your IV site that gets worse. Get help right away if: You have difficulty breathing. You have chest pain. You have blood in your urine or stool, or you vomit blood. Summary After the procedure, it is common to have a sore throat or nausea. It is also common to feel tired. Have a responsible adult stay with you for the time you are told. It is important to have someone help care for you until you are awake and alert. When you feel hungry, start by eating small amounts of foods that are soft and easy to digest (bland), such as toast. Gradually return to your regular diet. Drink enough fluid to keep your urine pale yellow. Return to your normal activities as told by your health care provider. Ask your health care provider what activities are safe for you. This information is not intended to replace advice given to you by your health care provider. Make sure you discuss any questions you have with your health care provider. Document Revised: 08/20/2020 Document Reviewed: 03/19/2020 Elsevier Patient Education  [place].

## 2022-07-07 IMAGING — US US SCROTUM W/ DOPPLER COMPLETE
1 series · 14 of 25 positions shown · non-contrast
Comparison: CT 12/25/2020.

CLINICAL DATA: Left testicular pain.

EXAM:
SCROTAL ULTRASOUND
DOPPLER ULTRASOUND OF THE TESTICLES
TECHNIQUE: Complete ultrasound examination of the testicles, epididymis, and
other scrotal structures was performed. Color and spectral Doppler
ultrasound were also utilized to evaluate blood flow to the
testicles.

[Series 1: us scrotum w/doppler · 14 of 70 slices shown]
[im 1/70]
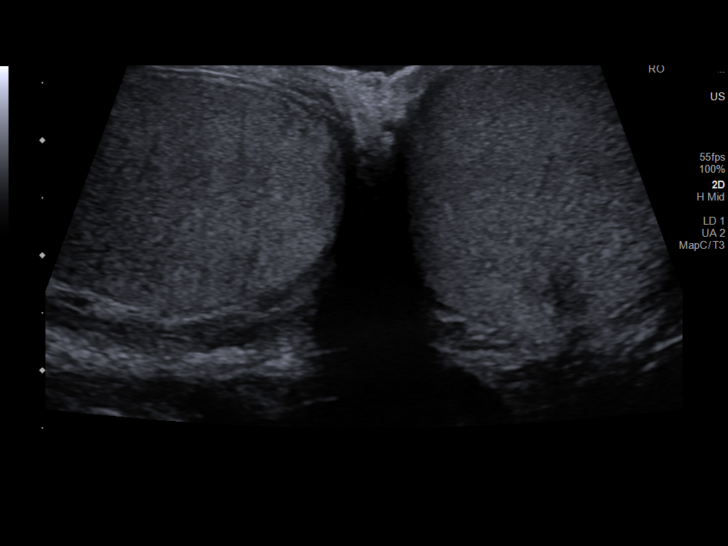
[im 6/70]
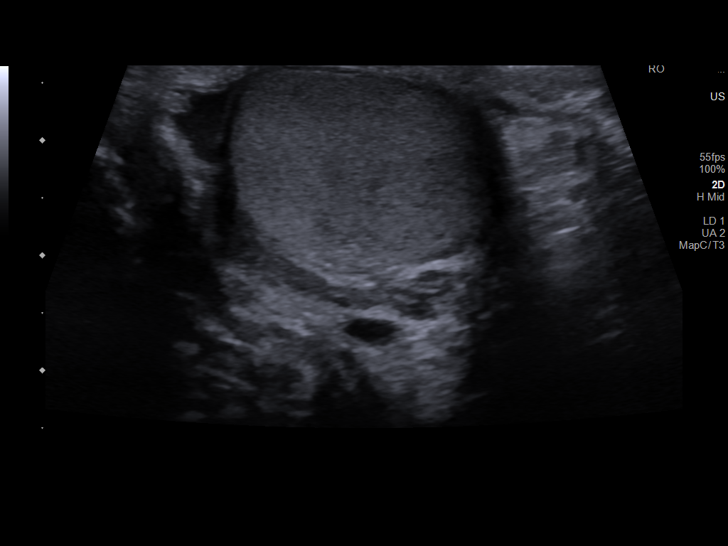
[im 12/70]
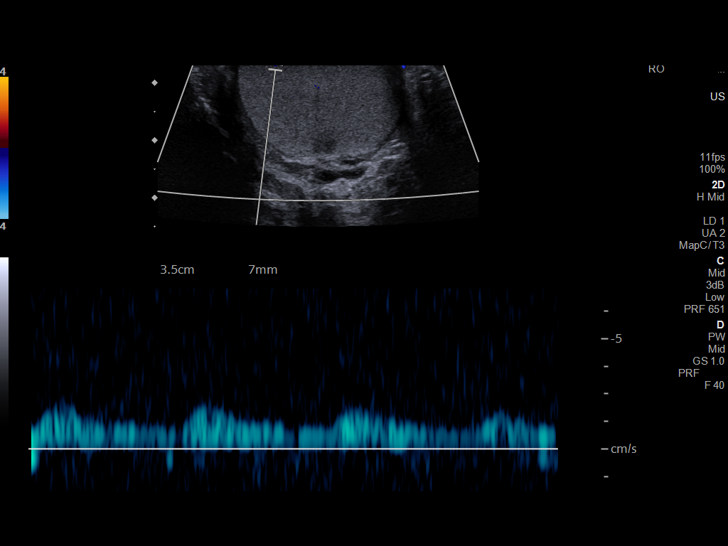
[im 18/70]
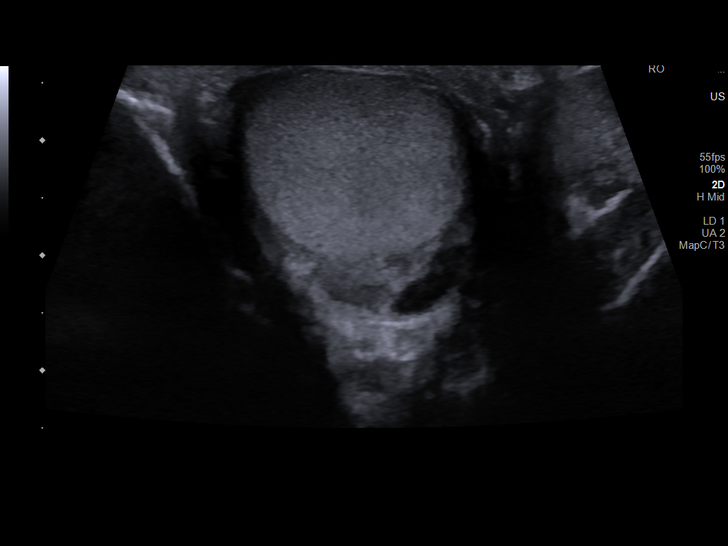
[im 24/70]
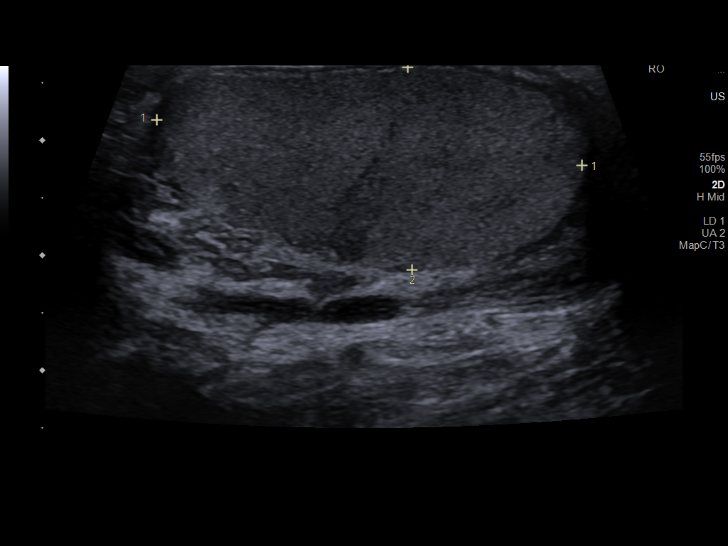
[im 26/70]
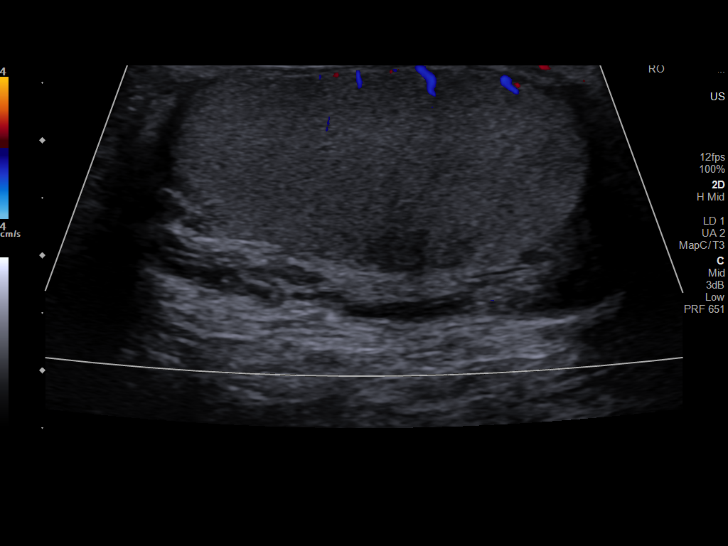
[im 32/70]
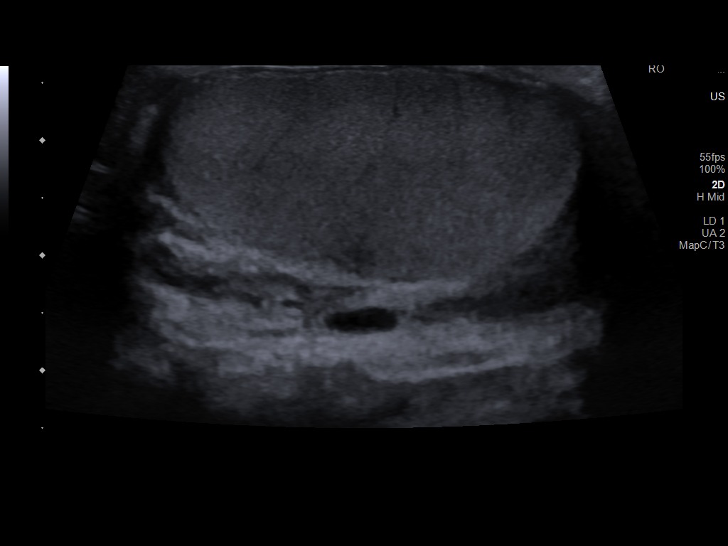
[im 38/70]
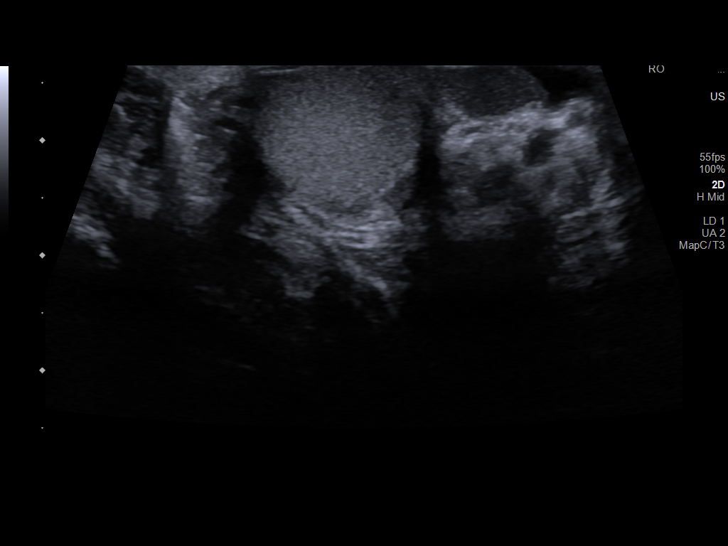
[im 44/70]
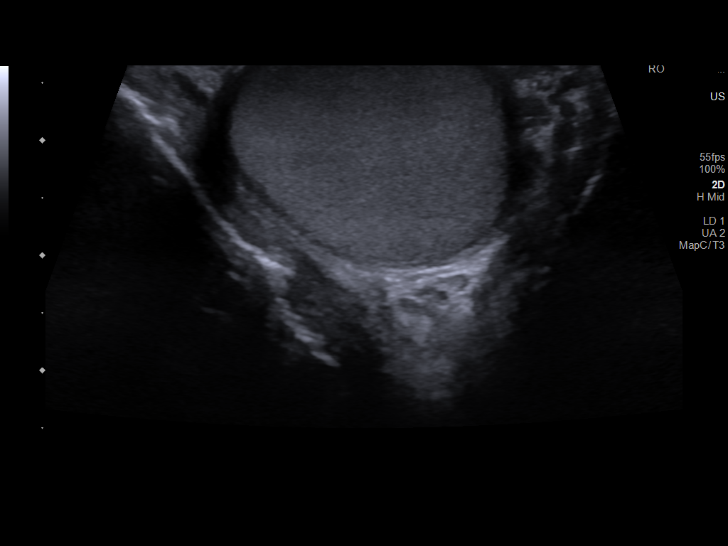
[im 47/70]
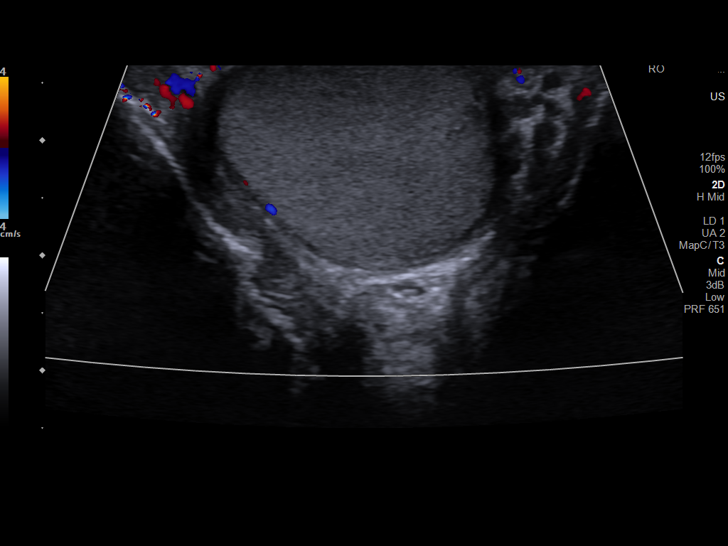
[im 52/70]
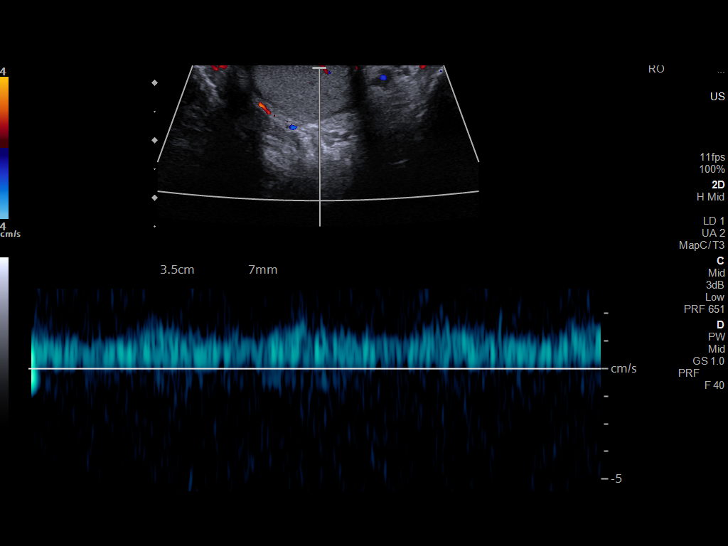
[im 58/70]
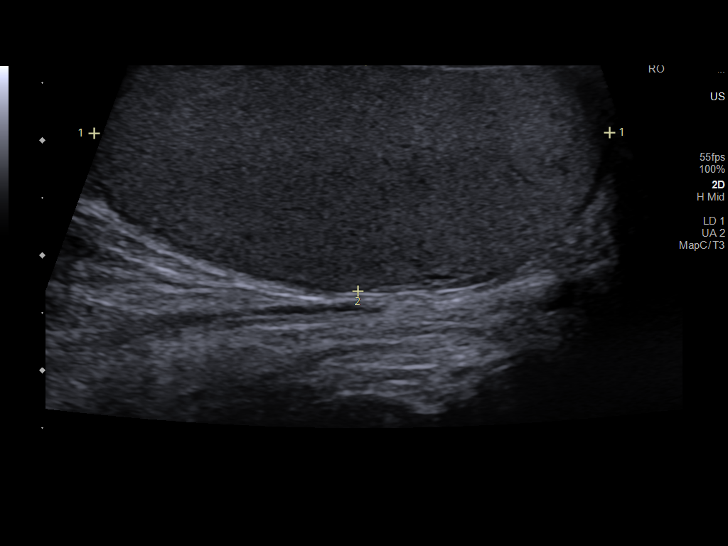
[im 64/70]
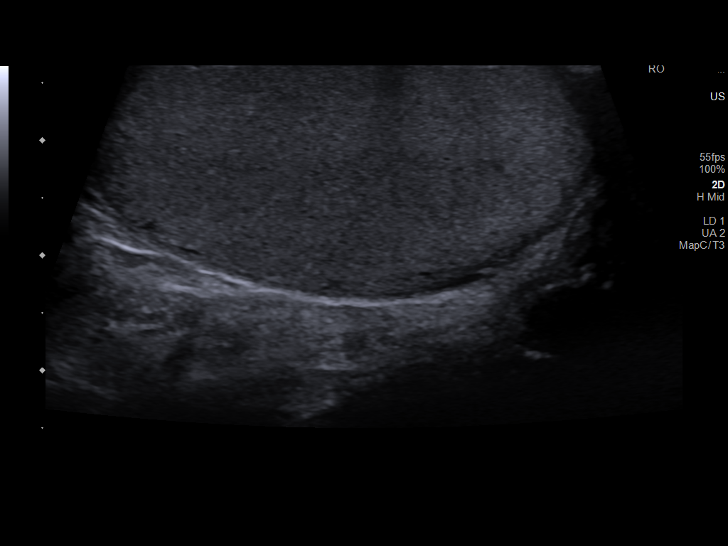
[im 70/70]
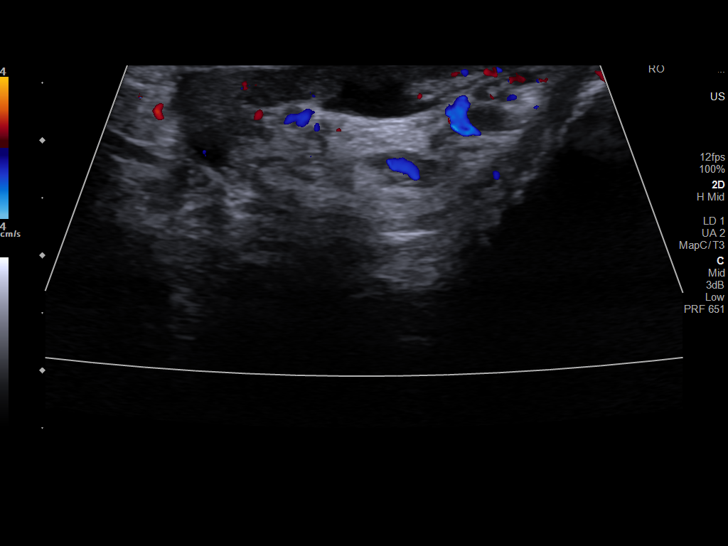

[14 of 25 positions shown; findings below may reference images not displayed]

FINDINGS: Right testicle

Measurements: 3.7 x 1.8 x 2.8 cm. No mass or microlithiasis
visualized.

Left testicle

Measurements: 4.5 x 2.0 x 2.4 cm. No mass or microlithiasis
visualized.

Right epididymis:  Normal in size and appearance.

Left epididymis:  Normal in size and appearance.

Hydrocele:  None visualized.

Varicocele:  None visualized.

Pulsed Doppler interrogation of both testes demonstrates normal low
resistance arterial and venous waveforms bilaterally.
IMPRESSION: Normal exam.

## 2022-07-09 ENCOUNTER — Other Ambulatory Visit: Payer: Self-pay

## 2022-07-09 ENCOUNTER — Encounter (HOSPITAL_COMMUNITY): Payer: Self-pay

## 2022-07-09 ENCOUNTER — Emergency Department (HOSPITAL_COMMUNITY)
Admission: EM | Admit: 2022-07-09 | Discharge: 2022-07-10 | Disposition: A | Discharge status: Home / Self Care | Payer: BC Managed Care – PPO | Attending: Emergency Medicine | Admitting: Emergency Medicine

## 2022-07-09 DIAGNOSIS — Z7982 Long term (current) use of aspirin: Secondary | ICD-10-CM | POA: Insufficient documentation

## 2022-07-09 DIAGNOSIS — N50819 Testicular pain, unspecified: Secondary | ICD-10-CM | POA: Diagnosis present

## 2022-07-09 DIAGNOSIS — R61 Generalized hyperhidrosis: Secondary | ICD-10-CM | POA: Diagnosis not present

## 2022-07-09 DIAGNOSIS — N289 Disorder of kidney and ureter, unspecified: Secondary | ICD-10-CM

## 2022-07-09 DIAGNOSIS — R1032 Left lower quadrant pain: Secondary | ICD-10-CM | POA: Diagnosis not present

## 2022-07-09 DIAGNOSIS — E876 Hypokalemia: Secondary | ICD-10-CM

## 2022-07-09 DIAGNOSIS — N390 Urinary tract infection, site not specified: Secondary | ICD-10-CM

## 2022-07-09 DIAGNOSIS — D696 Thrombocytopenia, unspecified: Secondary | ICD-10-CM

## 2022-07-09 DIAGNOSIS — R300 Dysuria: Secondary | ICD-10-CM | POA: Insufficient documentation

## 2022-07-09 DIAGNOSIS — R7401 Elevation of levels of liver transaminase levels: Secondary | ICD-10-CM

## 2022-07-09 LAB — CBC WITH DIFFERENTIAL/PLATELET
Abs Immature Granulocytes: 0.03 10*3/uL (ref 0.00–0.07)
Basophils Absolute: 0.1 10*3/uL (ref 0.0–0.1)
Basophils Relative: 1 %
Eosinophils Absolute: 0.1 10*3/uL (ref 0.0–0.5)
Eosinophils Relative: 1 %
HCT: 41.9 % (ref 39.0–52.0)
Hemoglobin: 14.4 g/dL (ref 13.0–17.0)
Immature Granulocytes: 0 %
Lymphocytes Relative: 12 %
Lymphs Abs: 1.1 10*3/uL (ref 0.7–4.0)
MCH: 30.6 pg (ref 26.0–34.0)
MCHC: 34.4 g/dL (ref 30.0–36.0)
MCV: 89.1 fL (ref 80.0–100.0)
Monocytes Absolute: 0.7 10*3/uL (ref 0.1–1.0)
Monocytes Relative: 7 %
Neutro Abs: 7.2 10*3/uL (ref 1.7–7.7)
Neutrophils Relative %: 79 %
Platelets: 125 10*3/uL — ABNORMAL LOW (ref 150–400)
RBC: 4.7 MIL/uL (ref 4.22–5.81)
RDW: 13.3 % (ref 11.5–15.5)
WBC: 9.1 10*3/uL (ref 4.0–10.5)
nRBC: 0 % (ref 0.0–0.2)

## 2022-07-09 LAB — URINALYSIS, ROUTINE W REFLEX MICROSCOPIC
Bilirubin Urine: NEGATIVE
Glucose, UA: NEGATIVE mg/dL
Ketones, ur: NEGATIVE mg/dL
Nitrite: POSITIVE — AB
Protein, ur: NEGATIVE mg/dL
Specific Gravity, Urine: 1.014 (ref 1.005–1.030)
WBC, UA: >50 WBC/hpf — ABNORMAL HIGH (ref 0–5)
pH: 6 (ref 5.0–8.0)

## 2022-07-09 LAB — COMPREHENSIVE METABOLIC PANEL
ALT: 43 U/L (ref 0–44)
AST: 42 U/L — ABNORMAL HIGH (ref 15–41)
Albumin: 4 g/dL (ref 3.5–5.0)
Alkaline Phosphatase: 48 U/L (ref 38–126)
Anion gap: 8 (ref 5–15)
BUN: 22 mg/dL — ABNORMAL HIGH (ref 6–20)
CO2: 27 mmol/L (ref 22–32)
Calcium: 8.7 mg/dL — ABNORMAL LOW (ref 8.9–10.3)
Chloride: 102 mmol/L (ref 98–111)
Creatinine, Ser: 1.26 mg/dL — ABNORMAL HIGH (ref 0.61–1.24)
GFR, Estimated: >60 mL/min (ref >60)
Glucose, Bld: 90 mg/dL (ref 70–99)
Potassium: 3.3 mmol/L — ABNORMAL LOW (ref 3.5–5.1)
Sodium: 137 mmol/L (ref 135–145)
Total Bilirubin: 1.3 mg/dL — ABNORMAL HIGH (ref 0.3–1.2)
Total Protein: 6.8 g/dL (ref 6.5–8.1)

## 2022-07-09 LAB — LACTIC ACID, PLASMA: Lactic Acid, Venous: 0.8 mmol/L (ref 0.5–1.9)

## 2022-07-09 MED ORDER — SODIUM CHLORIDE 0.9 % IV SOLN
1.0000 g | Freq: Once | INTRAVENOUS | Status: AC
  Administered 2022-07-09: 1 g via INTRAVENOUS
  Filled 2022-07-09: qty 10

## 2022-07-09 MED ORDER — ACETAMINOPHEN 500 MG PO TABS
1000.0000 mg | ORAL_TABLET | Freq: Once | ORAL | Status: AC
  Administered 2022-07-09: 1000 mg via ORAL
  Filled 2022-07-09: qty 2

## 2022-07-09 NOTE — ED Provider Notes (Signed)
Care assumed from Dr. Sabra Heck, patient with fever, chills, possible UTI pending labs. Plan to discharge on antibiotics if no serious laboratory abnormalities.  I have reviewed and interpreted all of the laboratory tests and my interpretation is mild renal insufficiency which is stable, mild hypokalemia, mild elevation of AST which is not felt to be clinically significant, mild thrombocytopenia which is also not felt to be clinically significant.  Lactic acid level was normal, no evidence of sepsis.  Urinalysis is significant for urinary tract infection with positive nitrite and greater than 50 WBCs per high-power field.  He is given a dose of oral potassium and is discharged with prescriptions for cephalexin and oral potassium.  He is to contact his urologist to see if surgery needs to be delayed because of his infection.  Results for orders placed or performed during the hospital encounter of 07/09/22  CBC with Differential  Result Value Ref Range   WBC 9.1 4.0 - 10.5 K/uL   RBC 4.70 4.22 - 5.81 MIL/uL   Hemoglobin 14.4 13.0 - 17.0 g/dL   HCT 41.9 39.0 - 52.0 %   MCV 89.1 80.0 - 100.0 fL   MCH 30.6 26.0 - 34.0 pg   MCHC 34.4 30.0 - 36.0 g/dL   RDW 13.3 11.5 - 15.5 %   Platelets 125 (L) 150 - 400 K/uL   nRBC 0.0 0.0 - 0.2 %   Neutrophils Relative % 79 %   Neutro Abs 7.2 1.7 - 7.7 K/uL   Lymphocytes Relative 12 %   Lymphs Abs 1.1 0.7 - 4.0 K/uL   Monocytes Relative 7 %   Monocytes Absolute 0.7 0.1 - 1.0 K/uL   Eosinophils Relative 1 %   Eosinophils Absolute 0.1 0.0 - 0.5 K/uL   Basophils Relative 1 %   Basophils Absolute 0.1 0.0 - 0.1 K/uL   Immature Granulocytes 0 %   Abs Immature Granulocytes 0.03 0.00 - 0.07 K/uL  Comprehensive metabolic panel  Result Value Ref Range   Sodium 137 135 - 145 mmol/L   Potassium 3.3 (L) 3.5 - 5.1 mmol/L   Chloride 102 98 - 111 mmol/L   CO2 27 22 - 32 mmol/L   Glucose, Bld 90 70 - 99 mg/dL   BUN 22 (H) 6 - 20 mg/dL   Creatinine, Ser 1.26 (H) 0.61 -  1.24 mg/dL   Calcium 8.7 (L) 8.9 - 10.3 mg/dL   Total Protein 6.8 6.5 - 8.1 g/dL   Albumin 4.0 3.5 - 5.0 g/dL   AST 42 (H) 15 - 41 U/L   ALT 43 0 - 44 U/L   Alkaline Phosphatase 48 38 - 126 U/L   Total Bilirubin 1.3 (H) 0.3 - 1.2 mg/dL   GFR, Estimated >60 >60 mL/min   Anion gap 8 5 - 15  Urinalysis, Routine w reflex microscopic Urine, Clean Catch  Result Value Ref Range   Color, Urine YELLOW YELLOW   APPearance HAZY (A) CLEAR   Specific Gravity, Urine 1.014 1.005 - 1.030   pH 6.0 5.0 - 8.0   Glucose, UA NEGATIVE NEGATIVE mg/dL   Hgb urine dipstick MODERATE (A) NEGATIVE   Bilirubin Urine NEGATIVE NEGATIVE   Ketones, ur NEGATIVE NEGATIVE mg/dL   Protein, ur NEGATIVE NEGATIVE mg/dL   Nitrite POSITIVE (A) NEGATIVE   Leukocytes,Ua LARGE (A) NEGATIVE   RBC / HPF 0-5 0 - 5 RBC/hpf   WBC, UA >50 (H) 0 - 5 WBC/hpf   Bacteria, UA RARE (A) NONE SEEN   Mucus PRESENT  Budding Yeast PRESENT   Lactic acid, plasma  Result Value Ref Range   Lactic Acid, Venous 0.8 0.5 - 1.9 mmol/L  Lactic acid, plasma  Result Value Ref Range   Lactic Acid, Venous 0.8 0.5 - 1.9 mmol/L     Delora Fuel, MD 43/32/95 [id number]

## 2022-07-09 NOTE — ED Provider Notes (Signed)
[facility] EMERGENCY DEPARTMENT Provider Note   CSN: [id number] Arrival date & time: 07/09/22  2215     History  Chief Complaint  Patient presents with   Testicle Pain    Chase Wyatt is a 51 y.o. male.   Testicle Pain    This patient is a 51 year old male, he has had problems with his genitourinary system including some urinary tract infections of recent, he has had a inguinal hernia repair bilaterally, his most recent procedure was in April where he had incisions in the bilateral inguinal regions.  He was diagnosed with prostatitis and epididymitis by urology and because of resistant epididymitis on the left he was scheduled for an epididymectomy, which was scheduled for approximately 5 days from now.  The patient states that today he started to develop dysuria, lower abdominal discomfort and now starts to have myalgias, fevers, chills, headache and sweating.  He took an ibuprofen approximately 3-1/2 hours ago but continues to have symptoms.  He has associated dysuria but no nausea vomiting or diarrhea.  Over the course of the last couple of months he was on an extended course of doxycycline for prostatitis which improved, he then developed a bug bite and cellulitis of his leg and was treated with Augmentin last dose was 5 days ago.  Temperature at home tonight was 101.5    Home Medications Prior to Admission medications   Medication Sig Start Date End Date Taking? Authorizing Provider  ALPRAZolam Duanne Moron) 0.5 MG tablet Take 0.5 mg by mouth 3 (three) times daily as needed for anxiety.    [provider]  aspirin EC 81 MG tablet Take 81 mg by mouth in the morning. Swallow whole.    [provider]  cetirizine (ZYRTEC) 10 MG tablet Take 10 mg by mouth in the morning.    [provider]  clindamycin (CLEOCIN) 150 MG capsule Take 150 mg by mouth every 8 (eight) hours. 06/28/22   [provider]  clotrimazole-betamethasone (LOTRISONE) cream Apply 1  Application topically 2 (two) times daily. Patient not taking: Reported on 07/05/2022 06/08/22   Summerlin, Berneice Heinrich, PA-C  cyclobenzaprine (FLEXERIL) 10 MG tablet Take 10 mg by mouth 3 (three) times daily as needed for muscle spasms.    [provider]  diclofenac (VOLTAREN) 75 MG EC tablet Take 1 tablet (75 mg total) by mouth 2 (two) times daily. Patient not taking: Reported on 07/05/2022 05/25/22   Cleon Gustin, MD  doxycycline (VIBRAMYCIN) 100 MG capsule Take 1 capsule (100 mg total) by mouth every 12 (twelve) hours. Patient not taking: Reported on 07/05/2022 06/08/22   Summerlin, Berneice Heinrich, PA-C  eletriptan (RELPAX) 40 MG tablet Take 40 mg by mouth daily as needed for migraine. 06/25/20   [provider]  fluticasone (FLONASE) 50 MCG/ACT nasal spray Place 1 spray into both nostrils in the morning.    [provider]  naproxen (NAPROSYN) 500 MG tablet Take 500 mg by mouth daily as needed for migraine. 03/25/20   [provider]  omeprazole (PRILOSEC) 40 MG capsule Take 40 mg by mouth daily before breakfast.    [provider]  ondansetron (ZOFRAN) 4 MG tablet Take 4 mg by mouth every 8 (eight) hours as needed (migraine related nausea).    [provider]  testosterone cypionate (DEPOTESTOSTERONE CYPIONATE) 200 MG/ML injection Inject 600 mg into the muscle every 14 (fourteen) days. 3 mls every other week 06/18/20   [provider]  valACYclovir (VALTREX) 1000 MG tablet  Take 1,000 mg by mouth daily as needed (fever blisters). 09/14/20   [provider]  zolpidem (AMBIEN) 10 MG tablet Take 10 mg by mouth at bedtime as needed for sleep. 02/24/22   [provider]      Allergies    Benzoin, Freederm adhesive remover [new skin], Meloxicam, and Oxycontin [oxycodone]    Review of Systems   Review of Systems  Genitourinary:  Positive for testicular pain.  All other systems reviewed and are negative.   Physical  Exam Updated Vital Signs BP 137/71 (BP Location: Right Arm)   Pulse 76   Temp 98.6 F (37 C) (Oral)   Resp 18   Ht 1.93 m ('6\' 4"'$ )   Wt 115.7 kg   SpO2 97%   BMI 31.04 kg/m  Physical Exam Vitals and nursing note reviewed.  Constitutional:      General: He is not in acute distress.    Appearance: He is well-developed. He is diaphoretic.  HENT:     Head: Normocephalic and atraumatic.     Mouth/Throat:     Pharynx: No oropharyngeal exudate.  Eyes:     General: No scleral icterus.       Right eye: No discharge.        Left eye: No discharge.     Conjunctiva/sclera: Conjunctivae normal.     Pupils: Pupils are equal, round, and reactive to light.  Neck:     Thyroid: No thyromegaly.     Vascular: No JVD.  Cardiovascular:     Rate and Rhythm: Normal rate and regular rhythm.     Heart sounds: Normal heart sounds. No murmur heard.    No friction rub. No gallop.  Pulmonary:     Effort: Pulmonary effort is normal. No respiratory distress.     Breath sounds: Normal breath sounds. No wheezing or rales.  Abdominal:     General: Bowel sounds are normal. There is no distension.     Palpations: Abdomen is soft. There is no mass.     Tenderness: There is abdominal tenderness.     Comments: Mild tenderness in the suprapubic and left lower quadrant  Genitourinary:    Comments: Chaperone present for genitourinary exam, there is a normal-appearing penis and scrotum, the right testicle is normal, the left testicle has mild swelling and tenderness in the posterior aspect of the testicle. Musculoskeletal:        General: No tenderness. Normal range of motion.     Cervical back: Normal range of motion and neck supple.     Right lower leg: No edema.     Left lower leg: No edema.  Lymphadenopathy:     Cervical: No cervical adenopathy.  Skin:    General: Skin is warm.     Findings: No erythema or rash.  Neurological:     Mental Status: He is alert.     Coordination: Coordination normal.   Psychiatric:        Behavior: Behavior normal.     ED Results / Procedures / Treatments   Labs (all labs ordered are listed, but only abnormal results are displayed) Labs Reviewed  URINE CULTURE  CBC WITH DIFFERENTIAL/PLATELET  COMPREHENSIVE METABOLIC PANEL  URINALYSIS, ROUTINE W REFLEX MICROSCOPIC  LACTIC ACID, PLASMA  LACTIC ACID, PLASMA    EKG None  Radiology No results found.  Procedures Procedures    Medications Ordered in ED Medications  cefTRIAXone (ROCEPHIN) 1 g in sodium chloride 0.9 % 100 mL IVPB (has no administration in  time range)  acetaminophen (TYLENOL) tablet 1,000 mg (has no administration in time range)    ED Course/ Medical Decision Making/ A&P                           Medical Decision Making Amount and/or Complexity of Data Reviewed Labs: ordered.  Risk OTC drugs.   This patient presents to the ED for concern of fever chills myalgias and urinary symptoms, this involves an extensive number of treatment options, and is a complaint that carries with it a high risk of complications and morbidity.  The differential diagnosis includes urinary tract infection cystitis pyelonephritis, he denies any tick bites or rashes, denies any respiratory symptoms   Co morbidities that complicate the patient evaluation  Recent urinary tract diagnoses including epididymitis, prostatitis, has had urinary tract infection   Additional history obtained:  Additional history obtained from electronic medical record External records from outside source obtained and reviewed including notes from urology visits and plan for surgery   Lab Tests:  I Ordered, and personally interpreted labs.  The pertinent results include: CBC metabolic panel urinalysis and culture as well as lactic acid, the results are pending at the time of change of shift   Cardiac Monitoring: / EKG:  The patient was maintained on a cardiac monitor.  I personally viewed and interpreted the  cardiac monitored which showed an underlying rhythm of: Normal sinus rhythm, heart rate in the 80s   Problem List / ED Course / Critical interventions / Medication management  Patient was given antibiotics for presumed urinary tract infection although labs are not available at the time of change of shift. The patient does not have tachycardia fever hypotension or any signs of sepsis on exam. I ordered medication including Rocephin for infection Reevaluation of the patient after these medicines showed that the patient stable I have reviewed the patients home medicines and have made adjustments as needed   Social Determinants of Health:  None   At the time of change of shift care signed out to Dr. Roxanne Mins to follow-up results and disposition accordingly        Final Clinical Impression(s) / ED Diagnoses Final diagnoses:  None    Rx / DC Orders ED Discharge Orders     None         Noemi Chapel, MD 07/09/22 2248

## 2022-07-09 NOTE — ED Notes (Signed)
ED Provider at bedside. 

## 2022-07-09 NOTE — ED Triage Notes (Signed)
Pt arrived via POV c/o left testicular pain. Pt endorses dysuria. Pt recently on doxycycline and finished treatment. Pt previous followed by Dr Alyson Ingles and recently had vasectomy w/o relief from symptoms. Pt reports fever at home earlier today of 101.7 F. Pts temp in triage is 98.47F. Pt reports taking '800mg'$  Ibuprofen at home PTA.

## 2022-07-10 ENCOUNTER — Telehealth (HOSPITAL_COMMUNITY): Payer: Self-pay | Admitting: Emergency Medicine

## 2022-07-10 LAB — LACTIC ACID, PLASMA: Lactic Acid, Venous: 0.8 mmol/L (ref 0.5–1.9)

## 2022-07-10 MED ORDER — POTASSIUM CHLORIDE CRYS ER 20 MEQ PO TBCR: 20.0000 meq | EXTENDED_RELEASE_TABLET | Freq: Every day | ORAL | 0 refills | Status: DC

## 2022-07-10 MED ORDER — CEPHALEXIN 500 MG PO CAPS: 500.0000 mg | ORAL_CAPSULE | Freq: Three times a day (TID) | ORAL | 0 refills | Status: DC

## 2022-07-10 MED ORDER — POTASSIUM CHLORIDE CRYS ER 20 MEQ PO TBCR
40.0000 meq | EXTENDED_RELEASE_TABLET | Freq: Once | ORAL | Status: AC
  Administered 2022-07-10: 40 meq via ORAL
  Filled 2022-07-10: qty 2

## 2022-07-10 MED ORDER — POTASSIUM CHLORIDE CRYS ER 20 MEQ PO TBCR: 20.0000 meq | EXTENDED_RELEASE_TABLET | Freq: Two times a day (BID) | ORAL | 0 refills | Status: DC

## 2022-07-10 MED ORDER — CEPHALEXIN 250 MG PO CAPS: 250.0000 mg | ORAL_CAPSULE | Freq: Three times a day (TID) | ORAL | 0 refills | Status: AC

## 2022-07-10 NOTE — ED Notes (Signed)
ED Provider at bedside. 

## 2022-07-10 NOTE — Telephone Encounter (Signed)
Pharmacy updated, meds sent

## 2022-07-10 NOTE — Discharge Instructions (Addendum)
Please call your urologist tomorrow to let them know that you on antibiotics for urinary tract infection.  Return to the emergency department if you are having any problems.

## 2022-07-11 ENCOUNTER — Encounter (HOSPITAL_COMMUNITY)
Admission: RE | Admit: 2022-07-11 | Discharge: 2022-07-11 | Disposition: A | Discharge status: Home / Self Care | Payer: BC Managed Care – PPO | Source: Ambulatory Visit | Attending: Urology | Admitting: Urology

## 2022-07-11 ENCOUNTER — Telehealth: Payer: Self-pay

## 2022-07-11 ENCOUNTER — Encounter (HOSPITAL_COMMUNITY): Payer: Self-pay

## 2022-07-11 VITALS — BP 121/67 | HR 63 | Temp 98.1°F | Resp 18 | Ht 76.0 in | Wt 255.0 lb

## 2022-07-11 DIAGNOSIS — N433 Hydrocele, unspecified: Secondary | ICD-10-CM | POA: Diagnosis present

## 2022-07-11 DIAGNOSIS — F419 Anxiety disorder, unspecified: Secondary | ICD-10-CM | POA: Diagnosis not present

## 2022-07-11 DIAGNOSIS — Z79899 Other long term (current) drug therapy: Secondary | ICD-10-CM | POA: Diagnosis not present

## 2022-07-11 DIAGNOSIS — G4730 Sleep apnea, unspecified: Secondary | ICD-10-CM | POA: Diagnosis not present

## 2022-07-11 DIAGNOSIS — N50812 Left testicular pain: Secondary | ICD-10-CM | POA: Diagnosis not present

## 2022-07-11 DIAGNOSIS — I10 Essential (primary) hypertension: Secondary | ICD-10-CM | POA: Diagnosis not present

## 2022-07-11 DIAGNOSIS — Z01818 Encounter for other preprocedural examination: Secondary | ICD-10-CM

## 2022-07-11 DIAGNOSIS — G709 Myoneural disorder, unspecified: Secondary | ICD-10-CM | POA: Diagnosis not present

## 2022-07-11 DIAGNOSIS — K219 Gastro-esophageal reflux disease without esophagitis: Secondary | ICD-10-CM | POA: Diagnosis not present

## 2022-07-11 HISTORY — DX: Presence of other specified functional implants: Z96.89

## 2022-07-11 LAB — SURGICAL PCR SCREEN
MRSA, PCR: NEGATIVE
Staphylococcus aureus: NEGATIVE

## 2022-07-11 NOTE — Telephone Encounter (Signed)
Patient's wife called to let Dr. Alyson Ingles know pt was seen at [facility] hospital and had a UTI.  He has had 3-4 UTI's in the last few months, the wife is asking if this is causing the inflammation or is the Epidiymis/ prostatitis causing the UTI's?  She feels that something is being missed.  Verbal from Dr. Alyson Ingles that the surgery and UTI's are not related.  I informed her and let her know we could schedule an appointment to discuss the questions in more detail with the Dr.  She is ok with waiting until his follow up to discuss questions and concerns.

## 2022-07-12 LAB — URINE CULTURE: Culture: >=100000 — AB

## 2022-07-13 ENCOUNTER — Other Ambulatory Visit: Payer: Self-pay

## 2022-07-13 ENCOUNTER — Ambulatory Visit (HOSPITAL_COMMUNITY)
Admission: RE | Admit: 2022-07-13 | Discharge: 2022-07-13 | Disposition: A | Discharge status: Home / Self Care | Payer: BC Managed Care – PPO | Source: Ambulatory Visit | Attending: Urology | Admitting: Urology

## 2022-07-13 ENCOUNTER — Telehealth: Payer: Self-pay

## 2022-07-13 ENCOUNTER — Ambulatory Visit (HOSPITAL_COMMUNITY): Payer: BC Managed Care – PPO | Admitting: Certified Registered"

## 2022-07-13 ENCOUNTER — Encounter (HOSPITAL_COMMUNITY)
Admission: RE | Disposition: A | Discharge status: Home / Self Care | Payer: Self-pay | Source: Ambulatory Visit | Attending: Urology

## 2022-07-13 ENCOUNTER — Encounter (HOSPITAL_COMMUNITY): Payer: Self-pay | Admitting: Urology

## 2022-07-13 DIAGNOSIS — F419 Anxiety disorder, unspecified: Secondary | ICD-10-CM | POA: Insufficient documentation

## 2022-07-13 DIAGNOSIS — G709 Myoneural disorder, unspecified: Secondary | ICD-10-CM | POA: Insufficient documentation

## 2022-07-13 DIAGNOSIS — K219 Gastro-esophageal reflux disease without esophagitis: Secondary | ICD-10-CM | POA: Insufficient documentation

## 2022-07-13 DIAGNOSIS — N433 Hydrocele, unspecified: Secondary | ICD-10-CM

## 2022-07-13 DIAGNOSIS — G4730 Sleep apnea, unspecified: Secondary | ICD-10-CM | POA: Insufficient documentation

## 2022-07-13 DIAGNOSIS — N50812 Left testicular pain: Secondary | ICD-10-CM | POA: Insufficient documentation

## 2022-07-13 DIAGNOSIS — I10 Essential (primary) hypertension: Secondary | ICD-10-CM | POA: Insufficient documentation

## 2022-07-13 DIAGNOSIS — Z79899 Other long term (current) drug therapy: Secondary | ICD-10-CM | POA: Insufficient documentation

## 2022-07-13 DIAGNOSIS — N5089 Other specified disorders of the male genital organs: Secondary | ICD-10-CM

## 2022-07-13 HISTORY — PX: EPIDIDYMECTOMY: SHX6275

## 2022-07-13 HISTORY — PX: HYDROCELE EXCISION: SHX482

## 2022-07-13 SURGERY — EPIDIDYMECTOMY
Anesthesia: General | Site: Scrotum | Laterality: Left

## 2022-07-13 MED ORDER — LIDOCAINE HCL (PF) 2 % IJ SOLN
INTRAMUSCULAR | Status: AC
  Filled 2022-07-13: qty 5

## 2022-07-13 MED ORDER — SUCCINYLCHOLINE CHLORIDE 200 MG/10ML IV SOSY
PREFILLED_SYRINGE | INTRAVENOUS | Status: DC | PRN
  Administered 2022-07-13: 100 mg via INTRAVENOUS

## 2022-07-13 MED ORDER — ONDANSETRON HCL 4 MG/2ML IJ SOLN
INTRAMUSCULAR | Status: AC
  Filled 2022-07-13: qty 2

## 2022-07-13 MED ORDER — DEXMEDETOMIDINE HCL IN NACL 80 MCG/20ML IV SOLN
INTRAVENOUS | Status: AC
  Filled 2022-07-13: qty 20

## 2022-07-13 MED ORDER — 0.9 % SODIUM CHLORIDE (POUR BTL) OPTIME
TOPICAL | Status: DC | PRN
  Administered 2022-07-13: 1000 mL

## 2022-07-13 MED ORDER — ROCURONIUM BROMIDE 10 MG/ML (PF) SYRINGE
PREFILLED_SYRINGE | INTRAVENOUS | Status: AC
  Filled 2022-07-13: qty 10

## 2022-07-13 MED ORDER — KETAMINE HCL 10 MG/ML IJ SOLN
INTRAMUSCULAR | Status: DC | PRN
  Administered 2022-07-13: 30 mg via INTRAVENOUS
  Administered 2022-07-13: 20 mg via INTRAVENOUS

## 2022-07-13 MED ORDER — SEVOFLURANE IN SOLN
RESPIRATORY_TRACT | Status: AC
  Filled 2022-07-13: qty 250

## 2022-07-13 MED ORDER — CEFAZOLIN SODIUM-DEXTROSE 2-4 GM/100ML-% IV SOLN
INTRAVENOUS | Status: AC
  Filled 2022-07-13: qty 100

## 2022-07-13 MED ORDER — PROPOFOL 10 MG/ML IV BOLUS
INTRAVENOUS | Status: AC
  Filled 2022-07-13: qty 20

## 2022-07-13 MED ORDER — ROCURONIUM BROMIDE 10 MG/ML (PF) SYRINGE
PREFILLED_SYRINGE | INTRAVENOUS | Status: DC | PRN
  Administered 2022-07-13: 40 mg via INTRAVENOUS

## 2022-07-13 MED ORDER — FENTANYL CITRATE (PF) 250 MCG/5ML IJ SOLN
INTRAMUSCULAR | Status: DC | PRN
  Administered 2022-07-13 (×3): 50 ug via INTRAVENOUS

## 2022-07-13 MED ORDER — ONDANSETRON HCL 4 MG/2ML IJ SOLN
INTRAMUSCULAR | Status: DC | PRN
  Administered 2022-07-13: 4 mg via INTRAVENOUS

## 2022-07-13 MED ORDER — HYDROMORPHONE HCL 1 MG/ML IJ SOLN
0.5000 mg | INTRAMUSCULAR | Status: DC | PRN
  Administered 2022-07-13 (×2): 0.5 mg via INTRAVENOUS
  Filled 2022-07-13: qty 0.5

## 2022-07-13 MED ORDER — DEXAMETHASONE SODIUM PHOSPHATE 4 MG/ML IJ SOLN
INTRAMUSCULAR | Status: DC | PRN
  Administered 2022-07-13: 5 mg via INTRAVENOUS

## 2022-07-13 MED ORDER — TAMSULOSIN HCL 0.4 MG PO CAPS: 0.4000 mg | ORAL_CAPSULE | Freq: Every day | ORAL | 11 refills | Status: DC

## 2022-07-13 MED ORDER — MIDAZOLAM HCL 2 MG/2ML IJ SOLN
INTRAMUSCULAR | Status: AC
  Filled 2022-07-13: qty 2

## 2022-07-13 MED ORDER — HYDROMORPHONE HCL 1 MG/ML IJ SOLN
INTRAMUSCULAR | Status: AC
  Filled 2022-07-13: qty 0.5

## 2022-07-13 MED ORDER — ORAL CARE MOUTH RINSE: 15.0000 mL | Freq: Once | OROMUCOSAL | Status: AC

## 2022-07-13 MED ORDER — PHENYLEPHRINE 80 MCG/ML (10ML) SYRINGE FOR IV PUSH (FOR BLOOD PRESSURE SUPPORT)
PREFILLED_SYRINGE | INTRAVENOUS | Status: AC
  Filled 2022-07-13: qty 10

## 2022-07-13 MED ORDER — DIPHENHYDRAMINE HCL 50 MG/ML IJ SOLN
INTRAMUSCULAR | Status: AC
  Filled 2022-07-13: qty 1

## 2022-07-13 MED ORDER — KETAMINE HCL 50 MG/5ML IJ SOSY
PREFILLED_SYRINGE | INTRAMUSCULAR | Status: AC
  Filled 2022-07-13: qty 5

## 2022-07-13 MED ORDER — LACTATED RINGERS IV SOLN: INTRAVENOUS | Status: DC

## 2022-07-13 MED ORDER — BUPIVACAINE HCL (PF) 0.25 % IJ SOLN
INTRAMUSCULAR | Status: AC
  Filled 2022-07-13: qty 30

## 2022-07-13 MED ORDER — DEXAMETHASONE SODIUM PHOSPHATE 10 MG/ML IJ SOLN
INTRAMUSCULAR | Status: AC
  Filled 2022-07-13: qty 1

## 2022-07-13 MED ORDER — MIDAZOLAM HCL 2 MG/2ML IJ SOLN
INTRAMUSCULAR | Status: DC | PRN
  Administered 2022-07-13: 2 mg via INTRAVENOUS

## 2022-07-13 MED ORDER — FENTANYL CITRATE PF 50 MCG/ML IJ SOSY
25.0000 ug | PREFILLED_SYRINGE | INTRAMUSCULAR | Status: DC | PRN
  Administered 2022-07-13: 50 ug via INTRAVENOUS
  Filled 2022-07-13: qty 1

## 2022-07-13 MED ORDER — GLYCOPYRROLATE PF 0.2 MG/ML IJ SOSY
PREFILLED_SYRINGE | INTRAMUSCULAR | Status: AC
  Filled 2022-07-13: qty 1

## 2022-07-13 MED ORDER — ONDANSETRON HCL 4 MG/2ML IJ SOLN: 4.0000 mg | Freq: Once | INTRAMUSCULAR | Status: DC | PRN

## 2022-07-13 MED ORDER — KETOROLAC TROMETHAMINE 30 MG/ML IJ SOLN
INTRAMUSCULAR | Status: AC
  Filled 2022-07-13: qty 1

## 2022-07-13 MED ORDER — DEXMEDETOMIDINE (PRECEDEX) IN NS 20 MCG/5ML (4 MCG/ML) IV SYRINGE
PREFILLED_SYRINGE | INTRAVENOUS | Status: DC | PRN
  Administered 2022-07-13 (×2): 12 ug via INTRAVENOUS

## 2022-07-13 MED ORDER — DIPHENHYDRAMINE HCL 50 MG/ML IJ SOLN
12.5000 mg | Freq: Once | INTRAMUSCULAR | Status: AC
  Administered 2022-07-13: 12.5 mg via INTRAVENOUS

## 2022-07-13 MED ORDER — CEFAZOLIN SODIUM-DEXTROSE 2-4 GM/100ML-% IV SOLN
2.0000 g | INTRAVENOUS | Status: AC
  Administered 2022-07-13: 2 g via INTRAVENOUS

## 2022-07-13 MED ORDER — CHLORHEXIDINE GLUCONATE 0.12 % MT SOLN
15.0000 mL | Freq: Once | OROMUCOSAL | Status: AC
  Administered 2022-07-13: 15 mL via OROMUCOSAL

## 2022-07-13 MED ORDER — SUGAMMADEX SODIUM 200 MG/2ML IV SOLN
INTRAVENOUS | Status: DC | PRN
  Administered 2022-07-13: 200 mg via INTRAVENOUS

## 2022-07-13 MED ORDER — FENTANYL CITRATE (PF) 250 MCG/5ML IJ SOLN
INTRAMUSCULAR | Status: AC
  Filled 2022-07-13: qty 5

## 2022-07-13 MED ORDER — LIDOCAINE 2% (20 MG/ML) 5 ML SYRINGE
INTRAMUSCULAR | Status: DC | PRN
  Administered 2022-07-13: 100 mg via INTRAVENOUS

## 2022-07-13 MED ORDER — BUPIVACAINE HCL 0.25 % IJ SOLN
INTRAMUSCULAR | Status: DC | PRN
  Administered 2022-07-13: 10 mL

## 2022-07-13 MED ORDER — HYDROCODONE-ACETAMINOPHEN 5-325 MG PO TABS
1.0000 | ORAL_TABLET | Freq: Four times a day (QID) | ORAL | 0 refills | Status: DC | PRN
Start: 2022-07-13 — End: 2023-01-26

## 2022-07-13 MED ORDER — PROPOFOL 10 MG/ML IV BOLUS
INTRAVENOUS | Status: DC | PRN
  Administered 2022-07-13: 200 mg via INTRAVENOUS

## 2022-07-13 SURGICAL SUPPLY — 33 items
ADH SKN CLS APL DERMABOND .7 (GAUZE/BANDAGES/DRESSINGS) ×1
BANDAGE GAUZE ELAST BULKY 4 IN (GAUZE/BANDAGES/DRESSINGS) ×1 IMPLANT
COVER SURGICAL LIGHT HANDLE (MISCELLANEOUS) ×4 IMPLANT
DERMABOND ADVANCED (GAUZE/BANDAGES/DRESSINGS) ×1
DERMABOND ADVANCED .7 DNX12 (GAUZE/BANDAGES/DRESSINGS) ×1 IMPLANT
DRSG TEGADERM 4X4.75 (GAUZE/BANDAGES/DRESSINGS) ×2 IMPLANT
ELECT NDL BLADE 2-5/6 (NEEDLE) ×1 IMPLANT
ELECT NEEDLE BLADE 2-5/6 (NEEDLE) ×2 IMPLANT
ELECT REM PT RETURN 9FT ADLT (ELECTROSURGICAL) ×2
ELECTRODE REM PT RTRN 9FT ADLT (ELECTROSURGICAL) ×1 IMPLANT
GAUZE 4X4 16PLY ~~LOC~~+RFID DBL (SPONGE) ×2 IMPLANT
GAUZE KERLIX 2X3 DERM STRL LF (GAUZE/BANDAGES/DRESSINGS) ×2 IMPLANT
GLOVE BIO SURGEON STRL SZ8 (GLOVE) ×2 IMPLANT
GLOVE BIOGEL PI IND STRL 7.0 (GLOVE) ×2 IMPLANT
GLOVE BIOGEL PI IND STRL 8 (GLOVE) ×1 IMPLANT
GLOVE BIOGEL PI INDICATOR 7.0 (GLOVE) ×2
GLOVE BIOGEL PI INDICATOR 8 (GLOVE) ×1
GOWN STRL REUS W/TWL LRG LVL3 (GOWN DISPOSABLE) ×2 IMPLANT
GOWN STRL REUS W/TWL XL LVL3 (GOWN DISPOSABLE) ×2 IMPLANT
KIT TURNOVER KIT A (KITS) ×2 IMPLANT
MANIFOLD NEPTUNE II (INSTRUMENTS) ×2 IMPLANT
NDL HYPO 25X1 1.5 SAFETY (NEEDLE) ×1 IMPLANT
NEEDLE HYPO 25X1 1.5 SAFETY (NEEDLE) ×2 IMPLANT
NS IRRIG 1000ML POUR BTL (IV SOLUTION) ×2 IMPLANT
PACK MINOR (CUSTOM PROCEDURE TRAY) ×2 IMPLANT
PAD ARMBOARD 7.5X6 YLW CONV (MISCELLANEOUS) ×2 IMPLANT
SET BASIN LINEN APH (SET/KITS/TRAYS/PACK) ×2 IMPLANT
SUPPORT SCROTAL LG STRP (MISCELLANEOUS) ×2 IMPLANT
SUT CHROMIC 3 0 PS 2 (SUTURE) IMPLANT
SUT MNCRL AB 4-0 PS2 18 (SUTURE) ×1 IMPLANT
SUT VIC AB 3-0 SH 27 (SUTURE) ×2
SUT VIC AB 3-0 SH 27X BRD (SUTURE) ×1 IMPLANT
SYR CONTROL 10ML LL (SYRINGE) ×2 IMPLANT

## 2022-07-13 NOTE — Interval H&P Note (Signed)
History and Physical Interval Note:  07/13/2022 8:20 AM  Chase Wyatt  has presented today for surgery, with the diagnosis of left epididymal pain.  The various methods of treatment have been discussed with the patient and family. After consideration of risks, benefits and other options for treatment, the patient has consented to  Procedure(s): EPIDIDYMECTOMY (Left) as a surgical intervention.  The patient's history has been reviewed, patient examined, no change in status, stable for surgery.  I have reviewed the patient's chart and labs.  Questions were answered to the patient's satisfaction.     [name]

## 2022-07-13 NOTE — Anesthesia Preprocedure Evaluation (Signed)
Anesthesia Evaluation  Patient identified by MRN, date of birth, ID band Patient awake    Reviewed: Allergy & Precautions, NPO status , Patient's Chart, lab work & pertinent test results  History of Anesthesia Complications (+) PONV, PROLONGED EMERGENCE and history of anesthetic complications  Airway Mallampati: III  TM Distance: >3 FB Neck ROM: Full    Dental  (+) Dental Advisory Given, Missing   Pulmonary sleep apnea and Continuous Positive Airway Pressure Ventilation ,    Pulmonary exam normal breath sounds clear to auscultation       Cardiovascular Exercise Tolerance: Good hypertension, Pt. on medications Normal cardiovascular exam+ Valvular Problems/Murmurs  Rhythm:Regular Rate:Normal     Neuro/Psych Anxiety  Neuromuscular disease    GI/Hepatic GERD  Medicated and Controlled,(+)     substance abuse  alcohol use,   Endo/Other  negative endocrine ROS  Renal/GU Renal InsufficiencyRenal disease     Musculoskeletal  (+) Arthritis  (thoracic laminectomy, spinal cord stimulator), thoracic laminectomy, spinal cord stimulato   Abdominal   Peds  Hematology   Anesthesia Other Findings   Reproductive/Obstetrics                             Anesthesia Physical  Anesthesia Plan  ASA: 3  Anesthesia Plan: General and General LMA   Post-op Pain Management:    Induction: Intravenous  PONV Risk Score and Plan: 4 or greater and Treatment may vary due to age or medical condition and Ondansetron  Airway Management Planned: LMA and Oral ETT  Additional Equipment:   Intra-op Plan:   Post-operative Plan: Extubation in OR  Informed Consent: I have reviewed the patients History and Physical, chart, labs and discussed the procedure including the risks, benefits and alternatives for the proposed anesthesia with the patient or authorized representative who has indicated his/her understanding and  acceptance.     Dental advisory given  Plan Discussed with: CRNA and Surgeon  Anesthesia Plan Comments:         Anesthesia Quick Evaluation

## 2022-07-13 NOTE — Anesthesia Procedure Notes (Signed)
Procedure Name: Intubation Date/Time: 07/13/2022 8:42 AM  Performed by: Orlie Dakin, CRNAPre-anesthesia Checklist: Patient identified, Emergency Drugs available, Suction available and Patient being monitored Patient Re-evaluated:Patient Re-evaluated prior to induction Oxygen Delivery Method: Circle system utilized Preoxygenation: Pre-oxygenation with 100% oxygen Induction Type: IV induction, Rapid sequence and Cricoid Pressure applied Laryngoscope Size: Miller and 3 Grade View: Grade II Tube type: Oral Tube size: 7.5 mm Number of attempts: 1 Airway Equipment and Method: Stylet Placement Confirmation: ETT inserted through vocal cords under direct vision, positive ETCO2 and breath sounds checked- equal and bilateral Secured at: 23 cm Tube secured with: Tape Dental Injury: Teeth and Oropharynx as per pre-operative assessment  Comments: 4x4s bite block used.

## 2022-07-13 NOTE — Telephone Encounter (Signed)
Post ED Visit - Positive Culture Follow-up  Culture report reviewed by antimicrobial stewardship pharmacist: [place] Team '[]'$  Elenor Quinones, Pharm.D. '[]'$  Heide Guile, Pharm.D., BCPS AQ-ID '[]'$  Parks Neptune, Pharm.D., BCPS '[]'$  Alycia Rossetti, Pharm.D., BCPS '[]'$  [place], Pharm.D., BCPS, AAHIVP '[]'$  Legrand Como, Pharm.D., BCPS, AAHIVP '[x]'$  Salome Arnt, PharmD, BCPS '[]'$  Johnnette Gourd, PharmD, BCPS '[]'$  Hughes Better, PharmD, BCPS '[]'$  Leeroy Cha, PharmD '[]'$  Laqueta Linden, PharmD, BCPS '[]'$  Albertina Parr, PharmD  [place] Team '[]'$  Leodis Sias, PharmD '[]'$  Lindell Spar, PharmD '[]'$  Royetta Asal, PharmD '[]'$  Graylin Shiver, Rph '[]'$  Rema Fendt) Glennon Mac, PharmD '[]'$  Arlyn Dunning, PharmD '[]'$  Netta Cedars, PharmD '[]'$  Dia Sitter, PharmD '[]'$  Leone Haven, PharmD '[]'$  Gretta Arab, PharmD '[]'$  Theodis Shove, PharmD '[]'$  Peggyann Juba, PharmD '[]'$  Reuel Boom, PharmD   Positive urine culture Treated with Cephalexin, organism sensitive to the same and no further patient follow-up is required at this time.  Glennon Hamilton 07/13/2022, 9:00 AM

## 2022-07-13 NOTE — Op Note (Signed)
Preoperative diagnosis: Left epididymal pain  Postoperative diagnosis: left epididymal pain, left hydrocele  Procedure: 1. Left epididymectomy 3. Left hydrocelectomy  Attending: [name], MD  Anesthesia: General  History of blood loss: Minimal  Antibiotics: ancef  Drains: none  Specimens: 1. Left epididymis  Findings: multiple left epididymal cysts  Indications: Patient is a 51 year old male with a history of left epididymis which was painful and causing him pain with walking.  We discussed the treatment options including observation versus excision after discussing treatment options he decided to proceed with excision.   Procedure in detail: Prior to procedure consent was obtained.  Patient was brought to the operating room and a brief timeout was done to ensure correct patient, correct procedure, correct site.  General anesthesia was administered and patient was placed in supine position.  His genitalia was then prepped and draped in usual sterile fashion.  A 3 cm incision was made in the left hemiscrotum.  We dissected down to the tunica and then incised the tunica. A small hydrocele was encountered. We then excised the hydrocele sac and then over sewed the edge with 2-0 Vicryl in a running fashion. We then excised the left appendix testis and sent it for pathology. We then used sharp dissection to excision the left epididymis. This was then sent for pathology. Hemostasis was then obtained with electrocautery. We then closed the defect in the epididymis with 3-0 vicryl in a running fashion. We then returned the testis to the left hemiscrotum and closed the overlying dartos with 3-0 vicryl in a running fashion. The skin was then closed with 4-0 monocryl in a running fashion. Dermabond was placed on the incision.  A dressing was then applied to the incision.  We then placed a scrotal fluff and this then concluded the procedure which was well tolerated by the patient.  Complications:  None  Condition: Stable, extubated, transferred to PACU.  Plan: Patient is to be discharged home.  He is to follow up in 2 weeks for wound check.

## 2022-07-13 NOTE — Transfer of Care (Signed)
Immediate Anesthesia Transfer of Care Note  Patient: AKSHAY SPANG  Procedure(s) Performed: EPIDIDYMECTOMY (Left: Scrotum) HYDROCELECTOMY ADULT (Left: Scrotum)  Patient Location: PACU  Anesthesia Type:General  Level of Consciousness: drowsy  Airway & Oxygen Therapy: Patient Spontanous Breathing and Patient connected to face mask oxygen  Post-op Assessment: Report given to RN and Post -op Vital signs reviewed and stable  Post vital signs: Reviewed and stable  Last Vitals:  Vitals Value Taken Time  BP 128/70 07/13/22 0942  Temp 36.4 C 07/13/22 0942  Pulse 59 07/13/22 0944  Resp 10 07/13/22 0944  SpO2 100 % 07/13/22 0944  Vitals shown include unvalidated device data.  Last Pain:  Vitals:   07/13/22 0746  TempSrc: Oral  PainSc: 6       Patients Stated Pain Goal: 6 (39/03/00 9233)  Complications: No notable events documented.

## 2022-07-14 ENCOUNTER — Encounter (HOSPITAL_COMMUNITY): Payer: Self-pay | Admitting: Urology

## 2022-07-14 NOTE — Anesthesia Postprocedure Evaluation (Signed)
Anesthesia Post Note  Patient: Chase Wyatt  Procedure(s) Performed: EPIDIDYMECTOMY (Left: Scrotum) HYDROCELECTOMY ADULT (Left: Scrotum)  Patient location during evaluation: Phase II Anesthesia Type: General Level of consciousness: awake Pain management: pain level controlled Vital Signs Assessment: post-procedure vital signs reviewed and stable Respiratory status: spontaneous breathing and respiratory function stable Cardiovascular status: blood pressure returned to baseline and stable Postop Assessment: no headache and no apparent nausea or vomiting Anesthetic complications: no Comments: Late entry   No notable events documented.   Last Vitals:  Vitals:   07/13/22 1122 07/13/22 1130  BP:  (!) 183/95  Pulse: (!) 59 62  Resp: 20 20  Temp:    SpO2: 100% 98%    Last Pain:  Vitals:   07/13/22 1130  TempSrc:   PainSc: [place]

## 2022-07-15 LAB — SURGICAL PATHOLOGY

## 2022-07-20 ENCOUNTER — Ambulatory Visit (INDEPENDENT_AMBULATORY_CARE_PROVIDER_SITE_OTHER): Payer: BC Managed Care – PPO | Admitting: Urology

## 2022-07-20 ENCOUNTER — Encounter: Payer: Self-pay | Admitting: Urology

## 2022-07-20 VITALS — BP 165/78 | HR 62

## 2022-07-20 DIAGNOSIS — E291 Testicular hypofunction: Secondary | ICD-10-CM

## 2022-07-20 DIAGNOSIS — N50819 Testicular pain, unspecified: Secondary | ICD-10-CM

## 2022-07-20 DIAGNOSIS — L290 Pruritus ani: Secondary | ICD-10-CM | POA: Diagnosis not present

## 2022-07-20 DIAGNOSIS — Z87438 Personal history of other diseases of male genital organs: Secondary | ICD-10-CM | POA: Diagnosis not present

## 2022-07-20 LAB — URINALYSIS, ROUTINE W REFLEX MICROSCOPIC
Bilirubin, UA: NEGATIVE
Glucose, UA: NEGATIVE
Ketones, UA: NEGATIVE
Leukocytes,UA: NEGATIVE
Nitrite, UA: NEGATIVE
Protein,UA: NEGATIVE
RBC, UA: NEGATIVE
Specific Gravity, UA: 1.01 (ref 1.005–1.030)
Urobilinogen, Ur: 0.2 mg/dL (ref 0.2–1.0)
pH, UA: 7 (ref 5.0–7.5)

## 2022-07-20 MED ORDER — LIDOCAINE VISCOUS HCL 2 % MT SOLN: 5.0000 mL | Freq: Three times a day (TID) | OROMUCOSAL | 0 refills | Status: DC | PRN

## 2022-07-20 MED ORDER — DIPHENHYDRAMINE HCL 12.5 MG/5ML PO LIQD
5.0000 mL | Freq: Three times a day (TID) | ORAL | 0 refills | Status: DC | PRN
Start: 2022-07-20 — End: 2022-07-20

## 2022-07-20 MED ORDER — CLOTRIMAZOLE-BETAMETHASONE 1-0.05 % EX CREA: 1.0000 | TOPICAL_CREAM | Freq: Two times a day (BID) | CUTANEOUS | 0 refills | Status: AC

## 2022-07-20 MED ORDER — LIDOCAINE VISCOUS HCL 2 % MT SOLN
5.0000 mL | Freq: Three times a day (TID) | OROMUCOSAL | 0 refills | Status: DC | PRN
Start: 2022-07-20 — End: 2022-08-17

## 2022-07-20 NOTE — Patient Instructions (Signed)

## 2022-07-20 NOTE — Progress Notes (Signed)
07/20/2022 1:25 PM   Chase Wyatt 1971-05-18 [id number]  Referring provider: [name], MD [place],  [place]  Followup left epididymectomy   HPI: Mr Pintor is a 51yo here for followup after left epididymectomy. He notes a rash on his inner thigh that developed 3 days post op. He also notes throat irritation. He has issues with hypogonadism and is currently on '600mg'$  every 2 weeks. No recent labs. His last injection was 2 weeks ago. He has fatigue.    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   BACK SURGERY     multiple   CARDIAC CATHETERIZATION  2013   [place], no stents   CHOLECYSTECTOMY  10/28/2011   Procedure: LAPAROSCOPIC CHOLECYSTECTOMY;  Surgeon: Jamesetta So;  Location: [facility];  Service: General;  Laterality: N/A;   EPIDIDYMECTOMY Left 07/13/2022   Procedure: EPIDIDYMECTOMY;  Surgeon: Cleon Gustin, MD;  Location: [facility];  Service: Urology;  Laterality: Left;   HYDROCELE EXCISION Left 07/13/2022   Procedure: HYDROCELECTOMY ADULT;  Surgeon: Cleon Gustin, MD;  Location: [facility];  Service: Urology;  Laterality: Left;   INGUINAL HERNIA REPAIR Left 08/07/2020   Procedure: HERNIA REPAIR INGUINAL ADULT using MESH MARLEX PLUG MEDIUM;  Surgeon: Aviva Signs, MD;  Location: [facility];  Service: General;  Laterality: Left;   INGUINAL HERNIA REPAIR Right 01/11/2021   Procedure: HERNIA REPAIR INGUINAL ADULT;  Surgeon: Aviva Signs, MD;  [facility]: [facility];  Service: General;   Laterality: Right;   LAMINECTOMY THORACIC SPINE W/ PLACEMENT SPINAL CORD STIMULATOR     NASAL SINUS SURGERY     SHOULDER OPEN ROTATOR CUFF REPAIR Left    [place]   UMBILICAL HERNIA REPAIR N/A 08/07/2020   Procedure: HERNIA REPAIR UMBILICAL ADULT;  Surgeon: Aviva Signs, MD;  Location: [facility];  Service: General;  Laterality: N/A;   VARICOCELECTOMY Right 06/10/2021   Procedure: right, neurolysis-subinguial;  Surgeon: Cleon Gustin, MD;  [facility]: [facility];  Service: Urology;  Laterality: Right;   VARICOCELECTOMY Right 09/02/2021   Procedure: VARICOCELECTOMY-right subingunial neurolysis;  Surgeon: Cleon Gustin, MD;  [facility];  Service: Urology;  Laterality: Right;   VARICOCELECTOMY Left 04/14/2022   Procedure: VARICOCELECTOMY-left neurolysis;  Surgeon: Cleon Gustin, MD;  [facility];  Service: Urology;  Laterality: Left;   VASECTOMY N/A 09/02/2021   Procedure: VASECTOMY;  Surgeon: Cleon Gustin, MD;  [facility]: [facility];  Service: Urology;  Laterality: N/A;    Home Medications:  Allergies as of 07/20/2022       Reactions   Benzoin Rash   Blisters   Freederm Adhesive Remover [new Skin] Itching   Meloxicam Hives   Oxycontin [oxycodone] Other (See Comments)   Headache        Medication List        Accurate as of July 20, 2022  1:25 PM. If you have any questions,  ask your nurse or doctor.          ALPRAZolam 0.5 MG tablet Commonly known as: XANAX Take 0.5 mg by mouth 3 (three) times daily as needed for anxiety.   aspirin EC 81 MG tablet Take 81 mg by mouth in the morning. Swallow whole.   cephALEXin 500 MG capsule Commonly known as: KEFLEX Take 1 capsule (500 mg total) by mouth 3 (three) times daily.   cetirizine 10 MG tablet Commonly known as: ZYRTEC Take 10 mg by mouth in the morning.   clotrimazole-betamethasone cream Commonly known as: Lotrisone Apply 1 Application topically 2 (two) times daily.   cyclobenzaprine 10 MG  tablet Commonly known as: FLEXERIL Take 10 mg by mouth 3 (three) times daily as needed for muscle spasms.   diclofenac 75 MG EC tablet Commonly known as: VOLTAREN Take 1 tablet (75 mg total) by mouth 2 (two) times daily.   eletriptan 40 MG tablet Commonly known as: RELPAX Take 40 mg by mouth daily as needed for migraine.   fluticasone 50 MCG/ACT nasal spray Commonly known as: FLONASE Place 1 spray into both nostrils in the morning.   HYDROcodone-acetaminophen 5-325 MG tablet Commonly known as: Norco Take 1 tablet by mouth every 6 (six) hours as needed for moderate pain.   naproxen 500 MG tablet Commonly known as: NAPROSYN Take 500 mg by mouth daily as needed for migraine.   omeprazole 40 MG capsule Commonly known as: PRILOSEC Take 40 mg by mouth daily before breakfast.   ondansetron 4 MG tablet Commonly known as: ZOFRAN Take 4 mg by mouth every 8 (eight) hours as needed (migraine related nausea).   potassium chloride SA 20 MEQ tablet Commonly known as: KLOR-CON M Take 1 tablet (20 mEq total) by mouth 2 (two) times daily.   potassium chloride SA 20 MEQ tablet Commonly known as: KLOR-CON M Take 1 tablet (20 mEq total) by mouth daily for 7 days.   tamsulosin 0.4 MG Caps capsule Commonly known as: Flomax Take 1 capsule (0.4 mg total) by mouth daily after supper.   testosterone cypionate 200 MG/ML injection Commonly known as: DEPOTESTOSTERONE CYPIONATE Inject 600 mg into the muscle every 14 (fourteen) days. 3 mls every other week   valACYclovir 1000 MG tablet Commonly known as: VALTREX Take 1,000 mg by mouth daily as needed (fever blisters).   zolpidem 10 MG tablet Commonly known as: AMBIEN Take 10 mg by mouth at bedtime as needed for sleep.        Allergies:  Allergies  Allergen Reactions   Benzoin Rash    Blisters   Freederm Adhesive Remover [New Skin] Itching   Meloxicam Hives   Oxycontin [Oxycodone] Other (See Comments)    Headache    Family  History: Family History  Problem Relation Age of Onset   Hypotension Neg Hx    Anesthesia problems Neg Hx    Malignant hyperthermia Neg Hx    Pseudochol deficiency Neg Hx     Social History:  reports that he has never smoked. He has quit using smokeless tobacco. He reports current alcohol use of about 1.0 standard drink of alcohol per week. He reports that he does not use drugs.  ROS: All other review of systems were reviewed and are negative except what is noted above in HPI  Physical Exam: BP (!) 165/78   Pulse 62   Constitutional:  Alert and oriented, No acute distress. HEENT: [place] AT, moist mucus membranes.  Trachea midline, no masses. Cardiovascular: No clubbing,  cyanosis, or edema. Respiratory: Normal respiratory effort, no increased work of breathing. GI: Abdomen is soft, nontender, nondistended, no abdominal masses GU: No CVA tenderness. Circumcised phallus. No masses/lesions on penis, testis, scrotum. Prostate 40g smooth no nodules no induration.  Lymph: No cervical or inguinal lymphadenopathy. Skin: No rashes, bruises or suspicious lesions. Neurologic: Grossly intact, no focal deficits, moving all 4 extremities. Psychiatric: Normal mood and affect.  Laboratory Data: Lab Results  Component Value Date   WBC 9.1 07/09/2022   HGB 14.4 07/09/2022   HCT 41.9 07/09/2022   MCV 89.1 07/09/2022   PLT 125 (L) 07/09/2022    Lab Results  Component Value Date   CREATININE 1.26 (H) 07/09/2022    No results found for: "PSA"  No results found for: "TESTOSTERONE"  No results found for: "HGBA1C"  Urinalysis    Component Value Date/Time   COLORURINE YELLOW 07/09/2022 2244   APPEARANCEUR HAZY (A) 07/09/2022 2244   APPEARANCEUR Clear 06/22/2022 0905   LABSPEC 1.014 07/09/2022 2244   PHURINE 6.0 07/09/2022 2244   GLUCOSEU NEGATIVE 07/09/2022 2244   HGBUR MODERATE (A) 07/09/2022 2244   BILIRUBINUR NEGATIVE 07/09/2022 2244   BILIRUBINUR Negative 06/22/2022 0905   KETONESUR  NEGATIVE 07/09/2022 2244   PROTEINUR NEGATIVE 07/09/2022 2244   NITRITE POSITIVE (A) 07/09/2022 2244   LEUKOCYTESUR LARGE (A) 07/09/2022 2244    Lab Results  Component Value Date   LABMICR Comment 06/22/2022   WBCUA 6-10 (A) 06/08/2022   LABEPIT None seen 06/08/2022   BACTERIA RARE (A) 07/09/2022    Pertinent Imaging:  No results found for this or any previous visit.  No results found for this or any previous visit.  No results found for this or any previous visit.  No results found for this or any previous visit.  No results found for this or any previous visit.  No results found for this or any previous visit.  No results found for this or any previous visit.  No results found for this or any previous visit.   Assessment & Plan:    1. Pain in testicle, unspecified laterality -improved after epididymectomy. -clotrimazole BID - Urinalysis, Routine w reflex microscopic  2. Hypogonadism -testosterone labs today, will call with results -Patient will start '300mg'$  every 2 weeks   No follow-ups on file.  [name], MD  [facility] [place]

## 2022-07-26 LAB — CBC WITH DIFFERENTIAL
Basophils Absolute: 0.1 10*3/uL (ref 0.0–0.2)
Basos: 1 %
EOS (ABSOLUTE): 0.2 10*3/uL (ref 0.0–0.4)
Eos: 3 %
Hematocrit: 47.9 % (ref 37.5–51.0)
Hemoglobin: 16.2 g/dL (ref 13.0–17.7)
Immature Grans (Abs): 0.1 10*3/uL (ref 0.0–0.1)
Immature Granulocytes: 1 %
Lymphocytes Absolute: 1.2 10*3/uL (ref 0.7–3.1)
Lymphs: 19 %
MCH: 29.8 pg (ref 26.6–33.0)
MCHC: 33.8 g/dL (ref 31.5–35.7)
MCV: 88 fL (ref 79–97)
Monocytes Absolute: 0.4 10*3/uL (ref 0.1–0.9)
Monocytes: 6 %
Neutrophils Absolute: 4.4 10*3/uL (ref 1.4–7.0)
Neutrophils: 70 %
RBC: 5.43 x10E6/uL (ref 4.14–5.80)
RDW: 13 % (ref 11.6–15.4)
WBC: 6.3 10*3/uL (ref 3.4–10.8)

## 2022-07-26 LAB — COMPREHENSIVE METABOLIC PANEL
ALT: 47 IU/L — ABNORMAL HIGH (ref 0–44)
AST: 28 IU/L (ref 0–40)
Albumin/Globulin Ratio: 1.9 (ref 1.2–2.2)
Albumin: 4.8 g/dL (ref 3.8–4.9)
Alkaline Phosphatase: 80 IU/L (ref 44–121)
BUN/Creatinine Ratio: 20 (ref 9–20)
BUN: 24 mg/dL (ref 6–24)
Bilirubin Total: 0.5 mg/dL (ref 0.0–1.2)
CO2: 24 mmol/L (ref 20–29)
Calcium: 8.6 mg/dL — ABNORMAL LOW (ref 8.7–10.2)
Chloride: 99 mmol/L (ref 96–106)
Creatinine, Ser: 1.2 mg/dL (ref 0.76–1.27)
Globulin, Total: 2.5 g/dL (ref 1.5–4.5)
Glucose: 103 mg/dL — ABNORMAL HIGH (ref 70–99)
Potassium: 4.3 mmol/L (ref 3.5–5.2)
Sodium: 141 mmol/L (ref 134–144)
Total Protein: 7.3 g/dL (ref 6.0–8.5)
eGFR: 73 mL/min/{1.73_m2} (ref >59)

## 2022-07-26 LAB — TESTOSTERONE,FREE AND TOTAL
Testosterone, Free: 3.3 pg/mL — ABNORMAL LOW (ref 7.2–24.0)
Testosterone: 113 ng/dL — ABNORMAL LOW (ref 264–916)

## 2022-07-26 LAB — ESTRADIOL: Estradiol: 12.9 pg/mL (ref 7.6–42.6)

## 2022-07-26 LAB — PROLACTIN: Prolactin: 8.7 ng/mL (ref 4.0–15.2)

## 2022-08-01 ENCOUNTER — Ambulatory Visit: Payer: BC Managed Care – PPO | Admitting: Urology

## 2022-08-10 ENCOUNTER — Other Ambulatory Visit: Payer: Self-pay | Admitting: Urology

## 2022-08-10 MED ORDER — JATENZO 198 MG PO CAPS: 1.0000 | ORAL_CAPSULE | Freq: Two times a day (BID) | ORAL | 3 refills | Status: DC

## 2022-08-15 ENCOUNTER — Other Ambulatory Visit: Payer: BC Managed Care – PPO

## 2022-08-15 DIAGNOSIS — N390 Urinary tract infection, site not specified: Secondary | ICD-10-CM

## 2022-08-15 LAB — URINALYSIS, ROUTINE W REFLEX MICROSCOPIC
Bilirubin, UA: NEGATIVE
Glucose, UA: NEGATIVE
Ketones, UA: NEGATIVE
Nitrite, UA: NEGATIVE
Protein,UA: NEGATIVE
RBC, UA: NEGATIVE
Specific Gravity, UA: 1.01 (ref 1.005–1.030)
Urobilinogen, Ur: 0.2 mg/dL (ref 0.2–1.0)
pH, UA: 7 (ref 5.0–7.5)

## 2022-08-15 LAB — MICROSCOPIC EXAMINATION
Epithelial Cells (non renal): NONE SEEN /hpf (ref 0–10)
RBC, Urine: NONE SEEN /hpf (ref 0–2)
Renal Epithel, UA: NONE SEEN /hpf

## 2022-08-16 ENCOUNTER — Telehealth: Payer: Self-pay

## 2022-08-16 MED ORDER — DOXYCYCLINE HYCLATE 100 MG PO CAPS: 100.0000 mg | ORAL_CAPSULE | Freq: Two times a day (BID) | ORAL | 0 refills | Status: DC

## 2022-08-16 NOTE — Telephone Encounter (Signed)
Patient wife called and left message patient still having urinary symptoms and feeling worse today.patient left ua/uc sample yesterday.  UA reviewed with Dr. Alyson Ingles- new order for doxycyline '100mg'$  bid for 7 days received and sent to pharmacy.  Patient called and voiced understanding. Will keep scheduled f/u

## 2022-08-17 ENCOUNTER — Other Ambulatory Visit: Payer: Self-pay

## 2022-08-17 ENCOUNTER — Ambulatory Visit: Payer: BC Managed Care – PPO | Admitting: Urology

## 2022-08-17 VITALS — BP 158/94 | HR 78

## 2022-08-17 DIAGNOSIS — N50819 Testicular pain, unspecified: Secondary | ICD-10-CM

## 2022-08-17 DIAGNOSIS — E291 Testicular hypofunction: Secondary | ICD-10-CM

## 2022-08-17 LAB — URINE CULTURE

## 2022-08-17 MED ORDER — LIDOCAINE VISCOUS HCL 2 % MT SOLN: 5.0000 mL | Freq: Three times a day (TID) | OROMUCOSAL | 0 refills | Status: DC | PRN

## 2022-08-17 MED ORDER — ALUM & MAG HYDROXIDE-SIMETH 200-200-20 MG/5ML PO SUSP
5.0000 mL | Freq: Three times a day (TID) | ORAL | 0 refills | Status: DC | PRN
Start: 2022-08-17 — End: 2022-08-17

## 2022-08-17 NOTE — Progress Notes (Unsigned)
08/17/2022 11:53 AM   Chase Wyatt 04-Nov-1971 [id number]  Referring provider: [name], MD [place],  [place]  No chief complaint on file.   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   BACK SURGERY     multiple   CARDIAC CATHETERIZATION  2013   [place], no stents   CHOLECYSTECTOMY  10/28/2011   Procedure: LAPAROSCOPIC CHOLECYSTECTOMY;  Surgeon: Jamesetta So;  Location: [facility];  Service: General;  Laterality: N/A;   EPIDIDYMECTOMY Left 07/13/2022   Procedure: EPIDIDYMECTOMY;  Surgeon: Cleon Gustin, MD;  Location: [facility];  Service: Urology;  Laterality: Left;   HYDROCELE EXCISION Left 07/13/2022   Procedure: HYDROCELECTOMY ADULT;  Surgeon: Cleon Gustin, MD;  Location: [facility];  Service: Urology;  Laterality: Left;   INGUINAL HERNIA REPAIR Left 08/07/2020   Procedure: HERNIA REPAIR INGUINAL ADULT using MESH MARLEX PLUG MEDIUM;  Surgeon: Aviva Signs, MD;  Location: [facility];  Service: General;  Laterality: Left;   INGUINAL HERNIA REPAIR Right 01/11/2021   Procedure: HERNIA REPAIR INGUINAL ADULT;  Surgeon: Aviva Signs, MD;  Location: [facility];  Service: General;  Laterality: Right;   LAMINECTOMY THORACIC SPINE W/ PLACEMENT SPINAL CORD STIMULATOR     NASAL SINUS SURGERY     SHOULDER OPEN ROTATOR CUFF REPAIR Left    [place]   UMBILICAL HERNIA REPAIR N/A 08/07/2020   Procedure: HERNIA REPAIR UMBILICAL ADULT;  Surgeon: Aviva Signs, MD;  Location: [facility];   Service: General;  Laterality: N/A;   VARICOCELECTOMY Right 06/10/2021   Procedure: right, neurolysis-subinguial;  Surgeon: Cleon Gustin, MD;  Location: [facility];  Service: Urology;  Laterality: Right;   VARICOCELECTOMY Right 09/02/2021   Procedure: VARICOCELECTOMY-right subingunial neurolysis;  Surgeon: Cleon Gustin, MD;  Location: [facility];  Service: Urology;  Laterality: Right;   VARICOCELECTOMY Left 04/14/2022   Procedure: VARICOCELECTOMY-left neurolysis;  Surgeon: Cleon Gustin, MD;  Location: [facility];  Service: Urology;  Laterality: Left;   VASECTOMY N/A 09/02/2021   Procedure: VASECTOMY;  Surgeon: Cleon Gustin, MD;  Location: [facility];  Service: Urology;  Laterality: N/A;    Home Medications:  Allergies as of 08/17/2022       Reactions   Benzoin Rash   Blisters   Freederm Adhesive Remover [new Skin] Itching   Meloxicam Hives   Oxycontin [oxycodone] Other (See Comments)   Headache        Medication List        Accurate as of August 17, 2022 11:53 AM. If you have any questions, ask your nurse or doctor.          ALPRAZolam 0.5 MG tablet Commonly known as: XANAX Take 0.5 mg by mouth 3 (three) times daily as needed for anxiety.   aspirin EC 81 MG tablet Take 81 mg by mouth in the morning. Swallow whole.   cephALEXin  500 MG capsule Commonly known as: KEFLEX Take 1 capsule (500 mg total) by mouth 3 (three) times daily.   cetirizine 10 MG tablet Commonly known as: ZYRTEC Take 10 mg by mouth in the morning.   clotrimazole-betamethasone cream Commonly known as: Lotrisone Apply 1 Application topically 2 (two) times daily.   cyclobenzaprine 10 MG tablet Commonly known as: FLEXERIL Take 10 mg by mouth 3 (three) times daily as needed for muscle spasms.   diclofenac 75 MG EC tablet Commonly known as: VOLTAREN Take 1 tablet (75 mg total) by mouth 2 (two) times daily.   doxycycline 100 MG capsule Commonly known as: VIBRAMYCIN Take 1 capsule  (100 mg total) by mouth every 12 (twelve) hours.   eletriptan 40 MG tablet Commonly known as: RELPAX Take 40 mg by mouth daily as needed for migraine.   fluticasone 50 MCG/ACT nasal spray Commonly known as: FLONASE Place 1 spray into both nostrils in the morning.   HYDROcodone-acetaminophen 5-325 MG tablet Commonly known as: Norco Take 1 tablet by mouth every 6 (six) hours as needed for moderate pain.   Jatenzo 198 MG Caps Generic drug: Testosterone Undecanoate Take 1 tablet by mouth in the morning and at bedtime.   magic mouthwash (lidocaine, diphenhydrAMINE, alum & mag hydroxide) suspension Swish and spit 5 mLs 3 (three) times daily as needed for mouth pain.   naproxen 500 MG tablet Commonly known as: NAPROSYN Take 500 mg by mouth daily as needed for migraine.   omeprazole 40 MG capsule Commonly known as: PRILOSEC Take 40 mg by mouth daily before breakfast.   ondansetron 4 MG tablet Commonly known as: ZOFRAN Take 4 mg by mouth every 8 (eight) hours as needed (migraine related nausea).   potassium chloride SA 20 MEQ tablet Commonly known as: KLOR-CON M Take 1 tablet (20 mEq total) by mouth 2 (two) times daily.   potassium chloride SA 20 MEQ tablet Commonly known as: KLOR-CON M Take 1 tablet (20 mEq total) by mouth daily for 7 days.   tamsulosin 0.4 MG Caps capsule Commonly known as: Flomax Take 1 capsule (0.4 mg total) by mouth daily after supper.   testosterone cypionate 200 MG/ML injection Commonly known as: DEPOTESTOSTERONE CYPIONATE Inject 600 mg into the muscle every 14 (fourteen) days. 3 mls every other week   valACYclovir 1000 MG tablet Commonly known as: VALTREX Take 1,000 mg by mouth daily as needed (fever blisters).   zolpidem 10 MG tablet Commonly known as: AMBIEN Take 10 mg by mouth at bedtime as needed for sleep.        Allergies:  Allergies  Allergen Reactions   Benzoin Rash    Blisters   Freederm Adhesive Remover [New Skin] Itching    Meloxicam Hives   Oxycontin [Oxycodone] Other (See Comments)    Headache    Family History: Family History  Problem Relation Age of Onset   Hypotension Neg Hx    Anesthesia problems Neg Hx    Malignant hyperthermia Neg Hx    Pseudochol deficiency Neg Hx     Social History:  reports that he has never smoked. He has quit using smokeless tobacco. He reports current alcohol use of about 1.0 standard drink of alcohol per week. He reports that he does not use drugs.  ROS: All other review of systems were reviewed and are negative except what is noted above in HPI  Physical Exam: BP (!) 158/94   Pulse 78   Constitutional:  Alert and oriented, No acute distress. HEENT:  [place] AT, moist mucus membranes.  Trachea midline, no masses. Cardiovascular: No clubbing, cyanosis, or edema. Respiratory: Normal respiratory effort, no increased work of breathing. GI: Abdomen is soft, nontender, nondistended, no abdominal masses GU: No CVA tenderness.  Lymph: No cervical or inguinal lymphadenopathy. Skin: No rashes, bruises or suspicious lesions. Neurologic: Grossly intact, no focal deficits, moving all 4 extremities. Psychiatric: Normal mood and affect.  Laboratory Data: Lab Results  Component Value Date   WBC 6.3 07/20/2022   HGB 16.2 07/20/2022   HCT 47.9 07/20/2022   MCV 88 07/20/2022   PLT 125 (L) 07/09/2022    Lab Results  Component Value Date   CREATININE 1.20 07/20/2022    No results found for: "PSA"  Lab Results  Component Value Date   TESTOSTERONE 113 (L) 07/20/2022    No results found for: "HGBA1C"  Urinalysis    Component Value Date/Time   COLORURINE YELLOW 07/09/2022 2244   APPEARANCEUR Clear 08/15/2022 1025   LABSPEC 1.014 07/09/2022 2244   PHURINE 6.0 07/09/2022 2244   GLUCOSEU Negative 08/15/2022 1025   HGBUR MODERATE (A) 07/09/2022 2244   BILIRUBINUR Negative 08/15/2022 1025   KETONESUR NEGATIVE 07/09/2022 2244   PROTEINUR Negative 08/15/2022 1025    PROTEINUR NEGATIVE 07/09/2022 2244   NITRITE Negative 08/15/2022 1025   NITRITE POSITIVE (A) 07/09/2022 2244   LEUKOCYTESUR Trace (A) 08/15/2022 1025   LEUKOCYTESUR LARGE (A) 07/09/2022 2244    Lab Results  Component Value Date   LABMICR See below: 08/15/2022   WBCUA 6-10 (A) 08/15/2022   LABEPIT None seen 08/15/2022   BACTERIA Few 08/15/2022    Pertinent Imaging: *** No results found for this or any previous visit.  No results found for this or any previous visit.  No results found for this or any previous visit.  No results found for this or any previous visit.  No results found for this or any previous visit.  No results found for this or any previous visit.  No results found for this or any previous visit.  No results found for this or any previous visit.   Assessment & Plan:    1. Pain in testicle, unspecified laterality -resolved   2. Hypogonadism in male -testosterone today - Testosterone,Free and Total  3. UTI -doxycycline '100mg'$  BID for 7 days    Return in about 4 weeks (around 09/14/2022).  [name], MD  [facility] [place]

## 2022-08-18 ENCOUNTER — Encounter: Payer: Self-pay | Admitting: Urology

## 2022-08-18 NOTE — Patient Instructions (Signed)

## 2022-08-23 ENCOUNTER — Encounter: Payer: Self-pay | Admitting: Urology

## 2022-08-24 ENCOUNTER — Encounter: Payer: Self-pay | Admitting: Urology

## 2022-08-25 ENCOUNTER — Encounter: Payer: Self-pay | Admitting: Urology

## 2022-08-25 LAB — TESTOSTERONE,FREE AND TOTAL
Testosterone, Free: 27.9 pg/mL — ABNORMAL HIGH (ref 7.2–24.0)
Testosterone: 1433 ng/dL — ABNORMAL HIGH (ref 264–916)

## 2022-08-25 NOTE — Telephone Encounter (Signed)
Just FYI- testosterone lab results back Testosterone level high Injection done Sunday- blood work done Wednesday.

## 2022-08-29 ENCOUNTER — Other Ambulatory Visit: Payer: BC Managed Care – PPO

## 2022-08-29 ENCOUNTER — Other Ambulatory Visit: Payer: Self-pay

## 2022-08-29 DIAGNOSIS — N410 Acute prostatitis: Secondary | ICD-10-CM

## 2022-08-29 DIAGNOSIS — N390 Urinary tract infection, site not specified: Secondary | ICD-10-CM

## 2022-08-31 ENCOUNTER — Other Ambulatory Visit: Payer: Self-pay | Admitting: Urology

## 2022-08-31 LAB — URINE CULTURE: Organism ID, Bacteria: NO GROWTH

## 2022-08-31 MED ORDER — TESTOSTERONE CYPIONATE 200 MG/ML IM SOLN
200.0000 mg | INTRAMUSCULAR | 3 refills | Status: DC
Start: 2022-08-31 — End: 2022-09-14

## 2022-09-14 ENCOUNTER — Encounter: Payer: Self-pay | Admitting: Urology

## 2022-09-14 ENCOUNTER — Ambulatory Visit: Payer: BC Managed Care – PPO | Admitting: Urology

## 2022-09-14 VITALS — BP 177/99 | HR 64

## 2022-09-14 DIAGNOSIS — E291 Testicular hypofunction: Secondary | ICD-10-CM

## 2022-09-14 DIAGNOSIS — N50819 Testicular pain, unspecified: Secondary | ICD-10-CM

## 2022-09-14 LAB — URINALYSIS, ROUTINE W REFLEX MICROSCOPIC
Bilirubin, UA: NEGATIVE
Glucose, UA: NEGATIVE
Ketones, UA: NEGATIVE
Leukocytes,UA: NEGATIVE
Nitrite, UA: NEGATIVE
Protein,UA: NEGATIVE
RBC, UA: NEGATIVE
Specific Gravity, UA: 1.005 (ref 1.005–1.030)
Urobilinogen, Ur: 0.2 mg/dL (ref 0.2–1.0)
pH, UA: 6.5 (ref 5.0–7.5)

## 2022-09-14 MED ORDER — TESTOSTERONE CYPIONATE 200 MG/ML IM SOLN
200.0000 mg | INTRAMUSCULAR | 3 refills | Status: DC
Start: 2022-09-14 — End: 2022-11-23

## 2022-09-14 NOTE — Progress Notes (Signed)
09/14/2022 10:27 AM   [name] [birth year] [id number]  Referring provider: [name], MD [place],  [place]  Followup hypogonadism   HPI: Mr Bodie is a 51yo here for followup for hypogonadism. His testosterone has been high normal on IM testosterone '200mg'$  every 2 weeks. He continues to have decreased energy in the morning and a feeling of decrease pleasure/desire to do activities that normal enjoyed. His testicular pain has resolved. 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   BACK SURGERY     multiple   CARDIAC CATHETERIZATION  2013   [place], no stents   CHOLECYSTECTOMY  10/28/2011   Procedure: LAPAROSCOPIC CHOLECYSTECTOMY;  Surgeon: Jamesetta So;  Location: [facility];  Service: General;  Laterality: N/A;   EPIDIDYMECTOMY Left 07/13/2022   Procedure: EPIDIDYMECTOMY;  Surgeon: Cleon Gustin, MD;  Location: [facility];  Service: Urology;  Laterality: Left;   HYDROCELE EXCISION Left 07/13/2022   Procedure: HYDROCELECTOMY ADULT;  Surgeon: Cleon Gustin, MD;  Location: [facility];  Service: Urology;  Laterality: Left;   INGUINAL HERNIA REPAIR Left 08/07/2020   Procedure: HERNIA REPAIR INGUINAL ADULT using MESH MARLEX PLUG MEDIUM;  Surgeon: Aviva Signs, MD;  Location: [facility];  Service: General;  Laterality: Left;   INGUINAL HERNIA REPAIR Right 01/11/2021   Procedure: HERNIA REPAIR INGUINAL ADULT;  Surgeon: Aviva Signs, MD;  Location: [facility];   Service: General;  Laterality: Right;   LAMINECTOMY THORACIC SPINE W/ PLACEMENT SPINAL CORD STIMULATOR     NASAL SINUS SURGERY     SHOULDER OPEN ROTATOR CUFF REPAIR Left    [place]   UMBILICAL HERNIA REPAIR N/A 08/07/2020   Procedure: HERNIA REPAIR UMBILICAL ADULT;  Surgeon: Aviva Signs, MD;  Location: [facility];  Service: General;  Laterality: N/A;   VARICOCELECTOMY Right 06/10/2021   Procedure: right, neurolysis-subinguial;  Surgeon: Cleon Gustin, MD;  Location: [facility];  Service: Urology;  Laterality: Right;   VARICOCELECTOMY Right 09/02/2021   Procedure: VARICOCELECTOMY-right subingunial neurolysis;  Surgeon: Cleon Gustin, MD;  [facility]: [facility];  Service: Urology;  Laterality: Right;   VARICOCELECTOMY Left 04/14/2022   Procedure: VARICOCELECTOMY-left neurolysis;  Surgeon: Cleon Gustin, MD;  [facility]: [facility];  Service: Urology;  Laterality: Left;   VASECTOMY N/A 09/02/2021   Procedure: VASECTOMY;  Surgeon: Cleon Gustin, MD;  [facility]: [facility];  Service: Urology;  Laterality: N/A;    Home Medications:  Allergies as of 09/14/2022       Reactions   Benzoin Rash   Blisters   Freederm Adhesive Remover [new Skin] Itching   Meloxicam Hives   Oxycontin [oxycodone] Other (See Comments)   Headache        Medication List        Accurate as of September 14, 2022 10:27 AM. If you have any questions, ask your nurse or doctor.  ALPRAZolam 0.5 MG tablet Commonly known as: XANAX Take 0.5 mg by mouth 3 (three) times daily as needed for anxiety.   aspirin EC 81 MG tablet Take 81 mg by mouth in the morning. Swallow whole.   cephALEXin 500 MG capsule Commonly known as: KEFLEX Take 1 capsule (500 mg total) by mouth 3 (three) times daily.   cetirizine 10 MG tablet Commonly known as: ZYRTEC Take 10 mg by mouth in the morning.   clotrimazole-betamethasone cream Commonly known as: Lotrisone Apply 1 Application topically 2 (two) times daily.    cyclobenzaprine 10 MG tablet Commonly known as: FLEXERIL Take 10 mg by mouth 3 (three) times daily as needed for muscle spasms.   diclofenac 75 MG EC tablet Commonly known as: VOLTAREN Take 1 tablet (75 mg total) by mouth 2 (two) times daily.   doxycycline 100 MG capsule Commonly known as: VIBRAMYCIN Take 1 capsule (100 mg total) by mouth every 12 (twelve) hours.   eletriptan 40 MG tablet Commonly known as: RELPAX Take 40 mg by mouth daily as needed for migraine.   fluticasone 50 MCG/ACT nasal spray Commonly known as: FLONASE Place 1 spray into both nostrils in the morning.   HYDROcodone-acetaminophen 5-325 MG tablet Commonly known as: Norco Take 1 tablet by mouth every 6 (six) hours as needed for moderate pain.   Jatenzo 198 MG Caps Generic drug: Testosterone Undecanoate Take 1 tablet by mouth in the morning and at bedtime.   magic mouthwash (lidocaine, diphenhydrAMINE, alum & mag hydroxide) suspension Swish and spit 5 mLs 3 (three) times daily as needed for mouth pain.   naproxen 500 MG tablet Commonly known as: NAPROSYN Take 500 mg by mouth daily as needed for migraine.   omeprazole 40 MG capsule Commonly known as: PRILOSEC Take 40 mg by mouth daily before breakfast.   ondansetron 4 MG tablet Commonly known as: ZOFRAN Take 4 mg by mouth every 8 (eight) hours as needed (migraine related nausea).   potassium chloride SA 20 MEQ tablet Commonly known as: KLOR-CON M Take 1 tablet (20 mEq total) by mouth 2 (two) times daily.   potassium chloride SA 20 MEQ tablet Commonly known as: KLOR-CON M Take 1 tablet (20 mEq total) by mouth daily for 7 days.   tamsulosin 0.4 MG Caps capsule Commonly known as: Flomax Take 1 capsule (0.4 mg total) by mouth daily after supper.   testosterone cypionate 200 MG/ML injection Commonly known as: DEPOTESTOSTERONE CYPIONATE Inject 1 mL (200 mg total) into the muscle every 14 (fourteen) days. 3 mls every other week   valACYclovir  1000 MG tablet Commonly known as: VALTREX Take 1,000 mg by mouth daily as needed (fever blisters).   zolpidem 10 MG tablet Commonly known as: AMBIEN Take 10 mg by mouth at bedtime as needed for sleep.        Allergies:  Allergies  Allergen Reactions   Benzoin Rash    Blisters   Freederm Adhesive Remover [New Skin] Itching   Meloxicam Hives   Oxycontin [Oxycodone] Other (See Comments)    Headache    Family History: Family History  Problem Relation Age of Onset   Hypotension Neg Hx    Anesthesia problems Neg Hx    Malignant hyperthermia Neg Hx    Pseudochol deficiency Neg Hx     Social History:  reports that he has never smoked. He has quit using smokeless tobacco. He reports current alcohol use of about 1.0 standard drink of alcohol per week. He reports that he  does not use drugs.  ROS: All other review of systems were reviewed and are negative except what is noted above in HPI  Physical Exam: BP (!) 177/99   Pulse 64   Constitutional:  Alert and oriented, No acute distress. HEENT: [place] AT, moist mucus membranes.  Trachea midline, no masses. Cardiovascular: No clubbing, cyanosis, or edema. Respiratory: Normal respiratory effort, no increased work of breathing. GI: Abdomen is soft, nontender, nondistended, no abdominal masses GU: No CVA tenderness.  Lymph: No cervical or inguinal lymphadenopathy. Skin: No rashes, bruises or suspicious lesions. Neurologic: Grossly intact, no focal deficits, moving all 4 extremities. Psychiatric: Normal mood and affect.  Laboratory Data: Lab Results  Component Value Date   WBC 6.3 07/20/2022   HGB 16.2 07/20/2022   HCT 47.9 07/20/2022   MCV 88 07/20/2022   PLT 125 (L) 07/09/2022    Lab Results  Component Value Date   CREATININE 1.20 07/20/2022    No results found for: "PSA"  Lab Results  Component Value Date   TESTOSTERONE 1,433 (H) 08/17/2022    No results found for: "HGBA1C"  Urinalysis    Component Value  Date/Time   COLORURINE YELLOW 07/09/2022 2244   APPEARANCEUR Clear 08/15/2022 1025   LABSPEC 1.014 07/09/2022 2244   PHURINE 6.0 07/09/2022 2244   GLUCOSEU Negative 08/15/2022 1025   HGBUR MODERATE (A) 07/09/2022 2244   BILIRUBINUR Negative 08/15/2022 1025   KETONESUR NEGATIVE 07/09/2022 2244   PROTEINUR Negative 08/15/2022 1025   PROTEINUR NEGATIVE 07/09/2022 2244   NITRITE Negative 08/15/2022 1025   NITRITE POSITIVE (A) 07/09/2022 2244   LEUKOCYTESUR Trace (A) 08/15/2022 1025   LEUKOCYTESUR LARGE (A) 07/09/2022 2244    Lab Results  Component Value Date   LABMICR See below: 08/15/2022   WBCUA 6-10 (A) 08/15/2022   LABEPIT None seen 08/15/2022   BACTERIA Few 08/15/2022    Pertinent Imaging:  No results found for this or any previous visit.  No results found for this or any previous visit.  No results found for this or any previous visit.  No results found for this or any previous visit.  No results found for this or any previous visit.  No valid procedures specified. No results found for this or any previous visit.  No results found for this or any previous visit.   Assessment & Plan:    1. Pain in testicle, unspecified laterality -resolved - Urinalysis, Routine w reflex microscopic  2. Hypogonadism in male -Continue IM testosterone '200mg'$  every 2 weeks -RTC 2 months for testosterone labs Patient counseled to contact his PCP to discuss starting SSRI/mood stabilizer   No follow-ups on file.  [name], MD  [facility] [place]

## 2022-09-14 NOTE — Patient Instructions (Signed)

## 2022-10-15 IMAGING — US US SCROTUM W/ DOPPLER COMPLETE
1 series · 14 of 25 positions shown · non-contrast
Comparison: Scrotal ultrasound dated February 14, 2022.

CLINICAL DATA: Left testicular pain.

EXAM:
SCROTAL ULTRASOUND
DOPPLER ULTRASOUND OF THE TESTICLES
TECHNIQUE: Complete ultrasound examination of the testicles, epididymis, and
other scrotal structures was performed. Color and spectral Doppler
ultrasound were also utilized to evaluate blood flow to the
testicles.

[Series 1: us scrotum w/doppler · 14 of 77 slices shown]
[im 1/77]
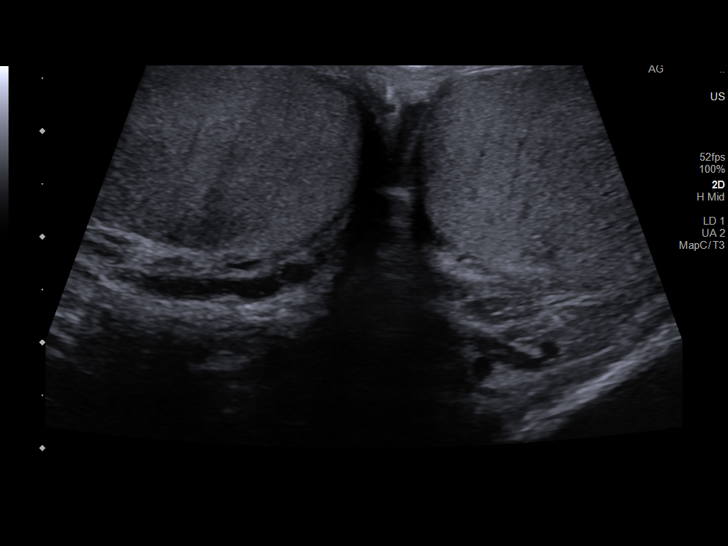
[im 7/77]
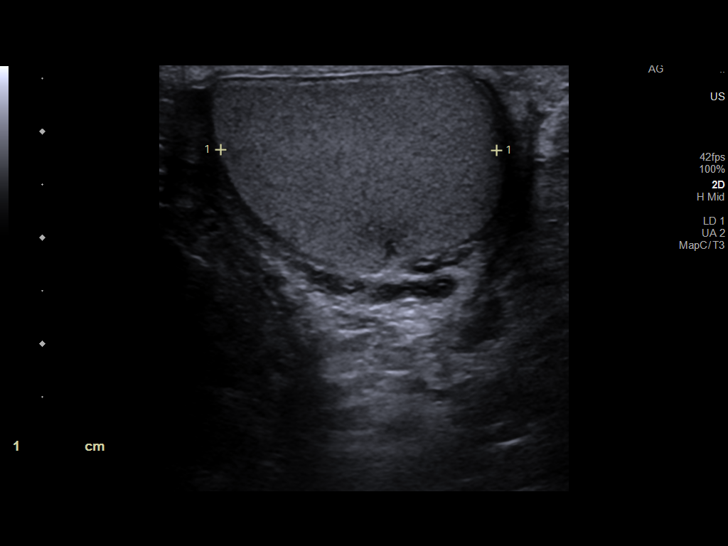
[im 13/77]
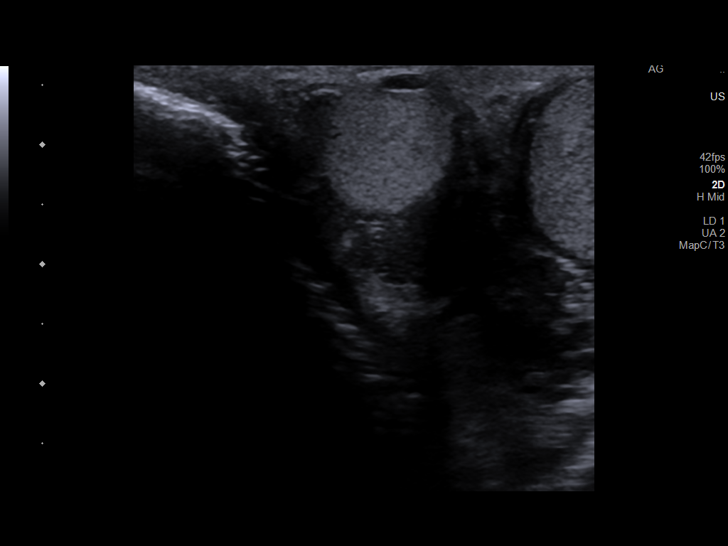
[im 20/77]
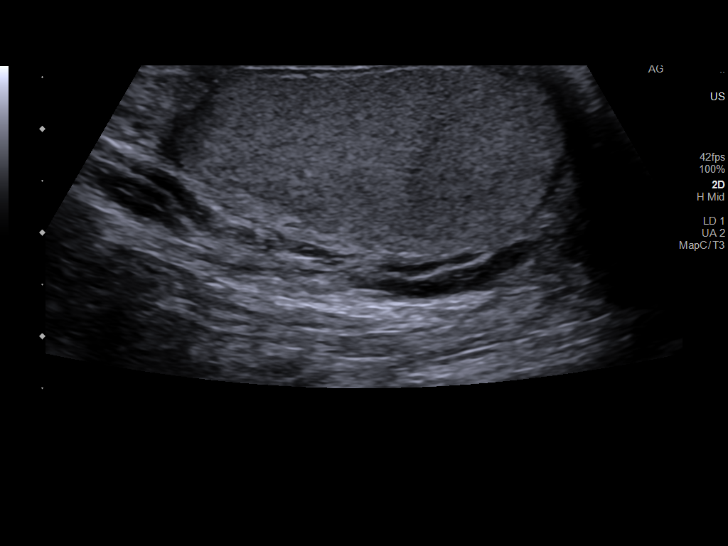
[im 26/77]
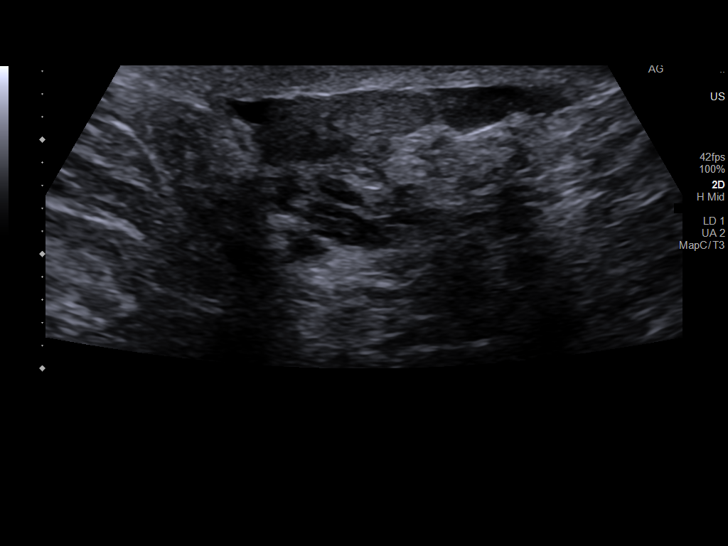
[im 29/77]
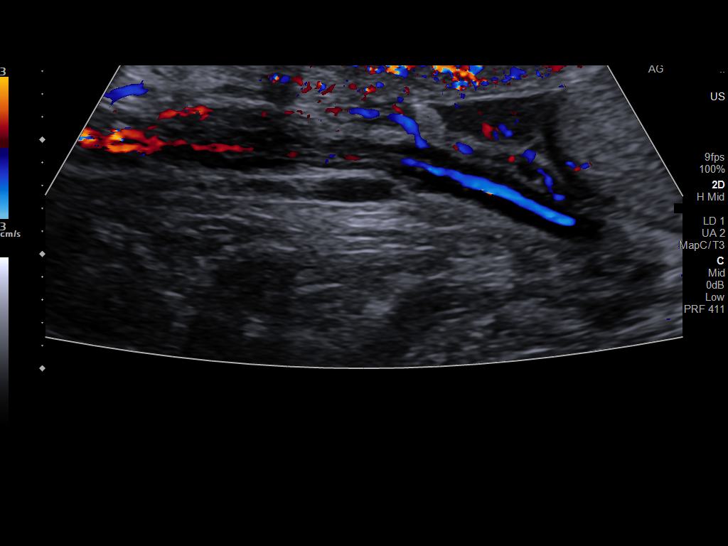
[im 35/77]
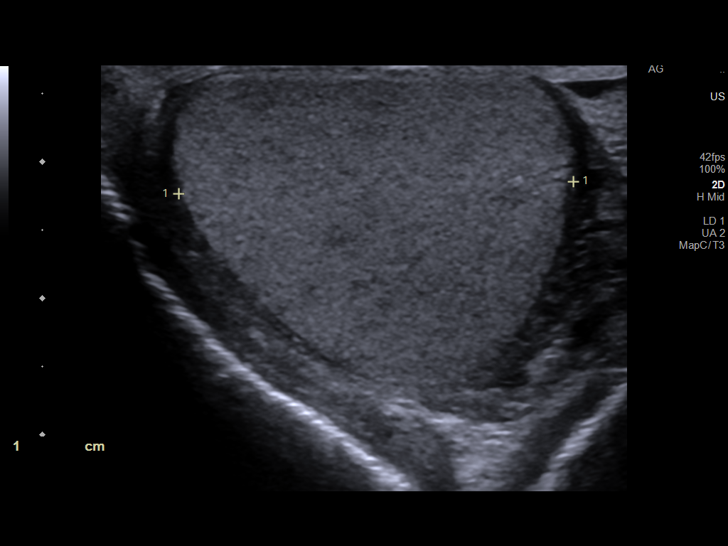
[im 42/77]
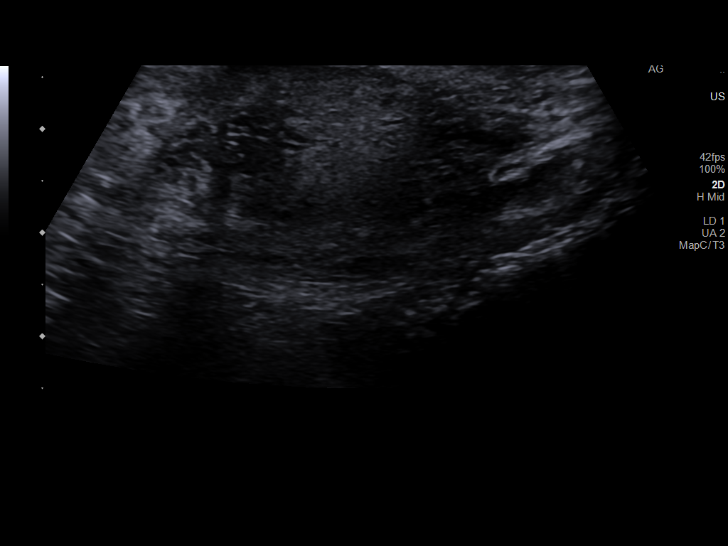
[im 48/77]
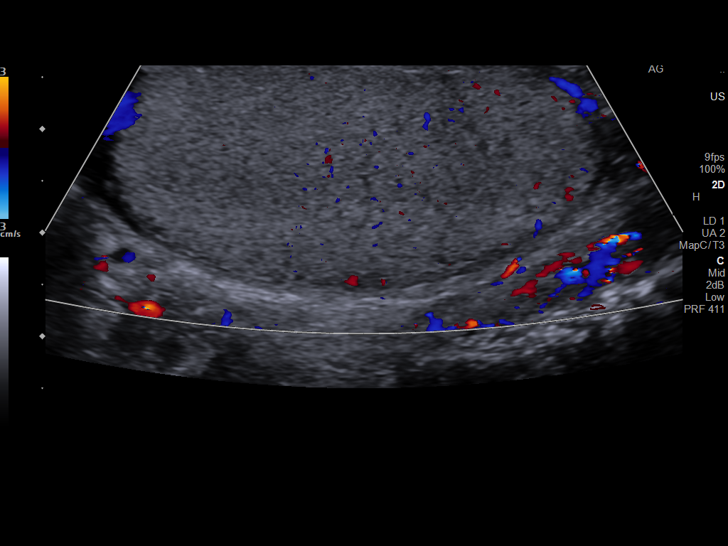
[im 51/77]
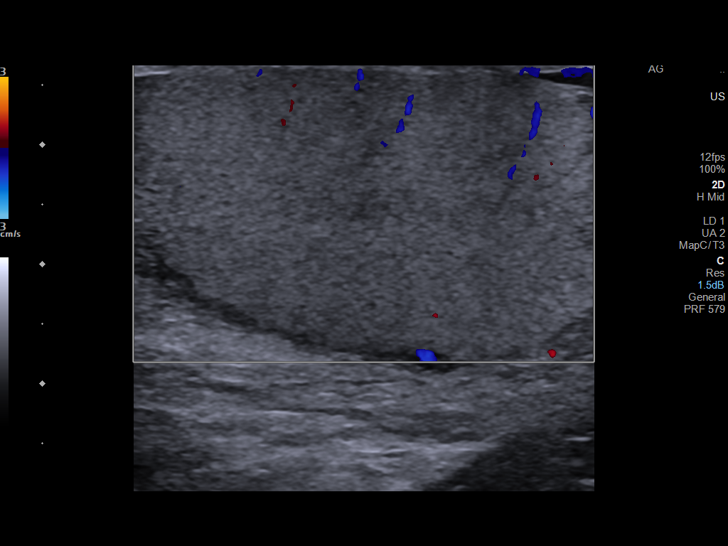
[im 58/77]
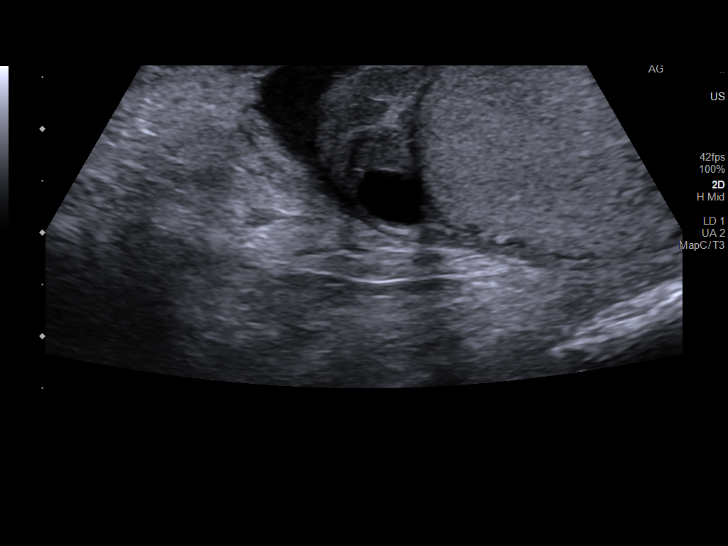
[im 64/77]
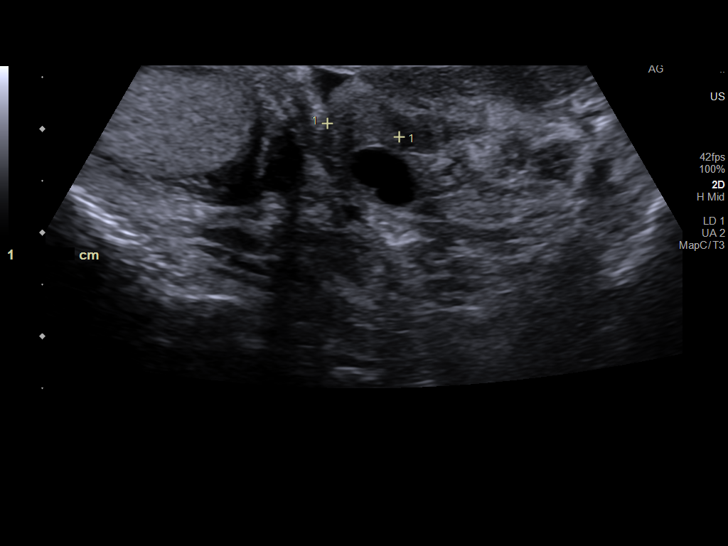
[im 70/77]
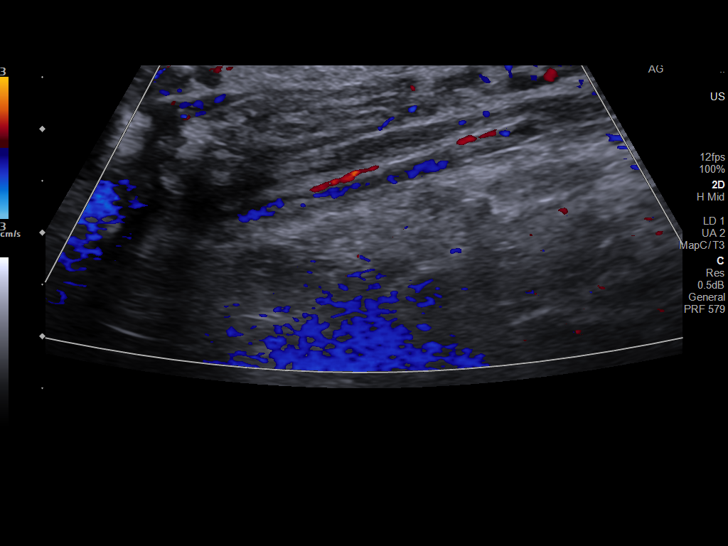
[im 77/77]
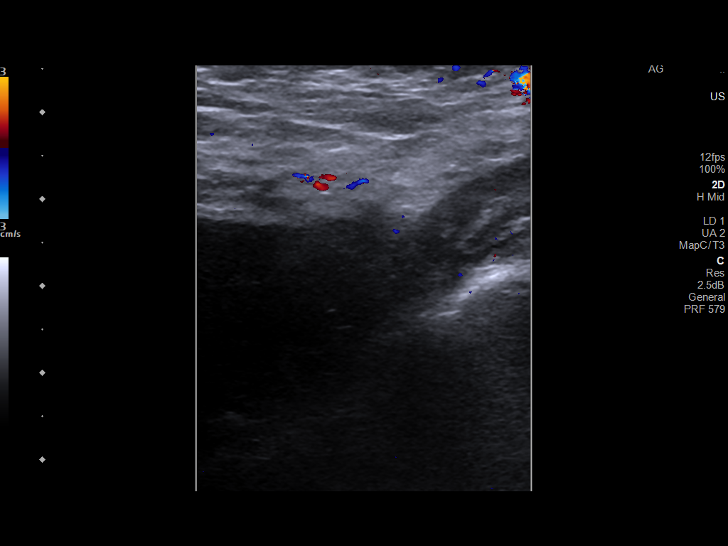

[14 of 25 positions shown; findings below may reference images not displayed]

FINDINGS: Right testicle

Measurements: 3.7 x 1.8 x 2.6 cm. No mass or microlithiasis
visualized.

Left testicle

Measurements: 4.9 x 2.2 x 2.9 cm. No mass or microlithiasis
visualized.

Right epididymis:  Normal in size and appearance.

Left epididymis: Normal in size and appearance. New 7 mm cyst in the
epididymal head.

Hydrocele:  Trace left-greater-than-right hydroceles.

Varicocele:  None visualized.

Pulsed Doppler interrogation of both testes demonstrates normal low
resistance arterial and venous waveforms bilaterally.

No evidence of inguinal hernia.
IMPRESSION: 1. No acute abnormality. New 7 mm cyst in the left epididymal head,
a benign finding. No further follow-up required.
2. Trace left-greater-than-right hydroceles.

## 2022-11-07 ENCOUNTER — Encounter: Payer: Self-pay | Admitting: Urology

## 2022-11-07 NOTE — Telephone Encounter (Signed)
Patient has been rescheduled per front staff.

## 2022-11-08 ENCOUNTER — Other Ambulatory Visit: Payer: BC Managed Care – PPO

## 2022-11-15 ENCOUNTER — Other Ambulatory Visit: Payer: BC Managed Care – PPO

## 2022-11-15 DIAGNOSIS — E291 Testicular hypofunction: Secondary | ICD-10-CM

## 2022-11-16 ENCOUNTER — Ambulatory Visit: Payer: BC Managed Care – PPO | Admitting: Urology

## 2022-11-17 LAB — COMPREHENSIVE METABOLIC PANEL
ALT: 34 IU/L (ref 0–44)
AST: 33 IU/L (ref 0–40)
Albumin/Globulin Ratio: 1.8 (ref 1.2–2.2)
Albumin: 4.4 g/dL (ref 3.8–4.9)
Alkaline Phosphatase: 64 IU/L (ref 44–121)
BUN/Creatinine Ratio: 12 (ref 9–20)
BUN: 14 mg/dL (ref 6–24)
Bilirubin Total: 0.3 mg/dL (ref 0.0–1.2)
CO2: 25 mmol/L (ref 20–29)
Calcium: 9.6 mg/dL (ref 8.7–10.2)
Chloride: 97 mmol/L (ref 96–106)
Creatinine, Ser: 1.13 mg/dL (ref 0.76–1.27)
Globulin, Total: 2.4 g/dL (ref 1.5–4.5)
Glucose: 105 mg/dL — ABNORMAL HIGH (ref 70–99)
Potassium: 4 mmol/L (ref 3.5–5.2)
Sodium: 140 mmol/L (ref 134–144)
Total Protein: 6.8 g/dL (ref 6.0–8.5)
eGFR: 79 mL/min/{1.73_m2} (ref >59)

## 2022-11-17 LAB — CBC WITH DIFFERENTIAL
Basophils Absolute: 0 10*3/uL (ref 0.0–0.2)
Basos: 1 %
EOS (ABSOLUTE): 0.1 10*3/uL (ref 0.0–0.4)
Eos: 3 %
Hematocrit: 41.2 % (ref 37.5–51.0)
Hemoglobin: 14.1 g/dL (ref 13.0–17.7)
Immature Grans (Abs): 0 10*3/uL (ref 0.0–0.1)
Immature Granulocytes: 0 %
Lymphocytes Absolute: 1 10*3/uL (ref 0.7–3.1)
Lymphs: 20 %
MCH: 29.5 pg (ref 26.6–33.0)
MCHC: 34.2 g/dL (ref 31.5–35.7)
MCV: 86 fL (ref 79–97)
Monocytes Absolute: 0.4 10*3/uL (ref 0.1–0.9)
Monocytes: 7 %
Neutrophils Absolute: 3.4 10*3/uL (ref 1.4–7.0)
Neutrophils: 69 %
RBC: 4.78 x10E6/uL (ref 4.14–5.80)
RDW: 12.9 % (ref 11.6–15.4)
WBC: 4.8 10*3/uL (ref 3.4–10.8)

## 2022-11-17 LAB — TESTOSTERONE,FREE AND TOTAL
Testosterone, Free: 14.7 pg/mL (ref 7.2–24.0)
Testosterone: 646 ng/dL (ref 264–916)

## 2022-11-23 ENCOUNTER — Ambulatory Visit: Payer: BC Managed Care – PPO | Admitting: Urology

## 2022-11-23 ENCOUNTER — Encounter: Payer: Self-pay | Admitting: Urology

## 2022-11-23 VITALS — BP 151/77 | HR 62

## 2022-11-23 DIAGNOSIS — E291 Testicular hypofunction: Secondary | ICD-10-CM | POA: Diagnosis not present

## 2022-11-23 MED ORDER — TESTOSTERONE CYPIONATE 200 MG/ML IM SOLN: 200.0000 mg | INTRAMUSCULAR | 3 refills | Status: DC

## 2022-11-23 MED ORDER — JATENZO 158 MG PO CAPS: 1.0000 | ORAL_CAPSULE | Freq: Two times a day (BID) | ORAL | 5 refills | Status: DC

## 2022-11-23 NOTE — Progress Notes (Signed)
11/23/2022 1:32 PM   Chase Wyatt 02-24-1971 [id number]  Referring provider: [name], MD [place],  [place]  Followup hypogonadism  HPI: [name] is a 51yo here for followup for hypogonadism. Testosterone 643. Energy is good. He is having significant pain, bleeding and bruising with the IM injections. No other complaints today. No testicular pain.    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   BACK SURGERY     multiple   CARDIAC CATHETERIZATION  2013   [place], no stents   CHOLECYSTECTOMY  10/28/2011   Procedure: LAPAROSCOPIC CHOLECYSTECTOMY;  Surgeon: Chase Wyatt;  Location: [facility];  Service: General;  Laterality: N/A;   EPIDIDYMECTOMY Left 07/13/2022   Procedure: EPIDIDYMECTOMY;  Surgeon: Chase Gustin, MD;  Location: [facility];  Service: Urology;  Laterality: Left;   HYDROCELE EXCISION Left 07/13/2022   Procedure: HYDROCELECTOMY ADULT;  Surgeon: Chase Gustin, MD;  Location: [facility];  Service: Urology;  Laterality: Left;   INGUINAL HERNIA REPAIR Left 08/07/2020   Procedure: HERNIA REPAIR INGUINAL ADULT using MESH MARLEX PLUG MEDIUM;  Surgeon: Chase Signs, MD;  Location: [facility];  Service: General;  Laterality: Left;   INGUINAL HERNIA REPAIR Right 01/11/2021   Procedure: HERNIA REPAIR INGUINAL ADULT;  Surgeon: Chase Signs, MD;  Location: [facility];  Service: General;  Laterality: Right;   LAMINECTOMY THORACIC SPINE W/ PLACEMENT SPINAL CORD STIMULATOR     NASAL SINUS  SURGERY     SHOULDER OPEN ROTATOR CUFF REPAIR Left    [place]   UMBILICAL HERNIA REPAIR N/A 08/07/2020   Procedure: HERNIA REPAIR UMBILICAL ADULT;  Surgeon: Chase Signs, MD;  Location: [facility];  Service: General;  Laterality: N/A;   VARICOCELECTOMY Right 06/10/2021   Procedure: right, neurolysis-subinguial;  Surgeon: Chase Gustin, MD;  Location: [facility];  Service: Urology;  Laterality: Right;   VARICOCELECTOMY Right 09/02/2021   Procedure: VARICOCELECTOMY-right subingunial neurolysis;  Surgeon: Chase Gustin, MD;  Location: [facility];  Service: Urology;  Laterality: Right;   VARICOCELECTOMY Left 04/14/2022   Procedure: VARICOCELECTOMY-left neurolysis;  Surgeon: Chase Gustin, MD;  Location: [facility];  Service: Urology;  Laterality: Left;   VASECTOMY N/A 09/02/2021   Procedure: VASECTOMY;  Surgeon: Chase Gustin, MD;  Location: [facility];  Service: Urology;  Laterality: N/A;    Home Medications:  Allergies as of 11/23/2022       Reactions   Benzoin Rash   Blisters   Freederm Adhesive Remover [new Skin] Itching   Meloxicam Hives   Oxycontin [oxycodone] Other (See Comments)   Headache        Medication List        Accurate as of November 23, 2022  1:32 PM. If you have any questions, ask your nurse or doctor.          STOP taking these medications    Jatenzo 198 MG  Caps Generic drug: Testosterone Undecanoate       TAKE these medications    ALPRAZolam 0.5 MG tablet Commonly known as: XANAX Take 0.5 mg by mouth 3 (three) times daily as needed for anxiety.   aspirin EC 81 MG tablet Take 81 mg by mouth in the morning. Swallow whole.   cephALEXin 500 MG capsule Commonly known as: KEFLEX Take 1 capsule (500 mg total) by mouth 3 (three) times daily.   cetirizine 10 MG tablet Commonly known as: ZYRTEC Take 10 mg by mouth in the morning.   clotrimazole-betamethasone cream Commonly known as: Lotrisone Apply 1 Application topically 2 (two) times  daily.   cyclobenzaprine 10 MG tablet Commonly known as: FLEXERIL Take 10 mg by mouth 3 (three) times daily as needed for muscle spasms.   diclofenac 75 MG EC tablet Commonly known as: VOLTAREN Take 1 tablet (75 mg total) by mouth 2 (two) times daily.   doxycycline 100 MG capsule Commonly known as: VIBRAMYCIN Take 1 capsule (100 mg total) by mouth every 12 (twelve) hours.   eletriptan 40 MG tablet Commonly known as: RELPAX Take 40 mg by mouth daily as needed for migraine.   fluticasone 50 MCG/ACT nasal spray Commonly known as: FLONASE Place 1 spray into both nostrils in the morning.   HYDROcodone-acetaminophen 5-325 MG tablet Commonly known as: Norco Take 1 tablet by mouth every 6 (six) hours as needed for moderate pain.   magic mouthwash (lidocaine, diphenhydrAMINE, alum & mag hydroxide) suspension Swish and spit 5 mLs 3 (three) times daily as needed for mouth pain.   naproxen 500 MG tablet Commonly known as: NAPROSYN Take 500 mg by mouth daily as needed for migraine.   omeprazole 40 MG capsule Commonly known as: PRILOSEC Take 40 mg by mouth daily before breakfast.   ondansetron 4 MG tablet Commonly known as: ZOFRAN Take 4 mg by mouth every 8 (eight) hours as needed (migraine related nausea).   potassium chloride SA 20 MEQ tablet Commonly known as: KLOR-CON M Take 1 tablet (20 mEq total) by mouth 2 (two) times daily.   potassium chloride SA 20 MEQ tablet Commonly known as: KLOR-CON M Take 1 tablet (20 mEq total) by mouth daily for 7 days.   tamsulosin 0.4 MG Caps capsule Commonly known as: Flomax Take 1 capsule (0.4 mg total) by mouth daily after supper.   testosterone cypionate 200 MG/ML injection Commonly known as: DEPOTESTOSTERONE CYPIONATE Inject 1 mL (200 mg total) into the muscle every 14 (fourteen) days. 3 mls every other week   valACYclovir 1000 MG tablet Commonly known as: VALTREX Take 1,000 mg by mouth daily as needed (fever blisters).    zolpidem 10 MG tablet Commonly known as: AMBIEN Take 10 mg by mouth at bedtime as needed for sleep.        Allergies:  Allergies  Allergen Reactions   Benzoin Rash    Blisters   Freederm Adhesive Remover [New Skin] Itching   Meloxicam Hives   Oxycontin [Oxycodone] Other (See Comments)    Headache    Family History: Family History  Problem Relation Age of Onset   Hypotension Neg Hx    Anesthesia problems Neg Hx    Malignant hyperthermia Neg Hx    Pseudochol deficiency Neg Hx     Social History:  reports that he has never smoked. He has quit using smokeless tobacco. He reports current alcohol use of about 1.0 standard drink of alcohol per week. He reports that he does not use drugs.  ROS: All other review of systems were reviewed and are negative except what is noted above in HPI  Physical Exam: BP (!) 151/77   Pulse 62   Constitutional:  Alert and oriented, No acute distress. HEENT: [place] AT, moist mucus membranes.  Trachea midline, no masses. Cardiovascular: No clubbing, cyanosis, or edema. Respiratory: Normal respiratory effort, no increased work of breathing. GI: Abdomen is soft, nontender, nondistended, no abdominal masses GU: No CVA tenderness.  Lymph: No cervical or inguinal lymphadenopathy. Skin: No rashes, bruises or suspicious lesions. Neurologic: Grossly intact, no focal deficits, moving all 4 extremities. Psychiatric: Normal mood and affect.  Laboratory Data: Lab Results  Component Value Date   WBC 4.8 11/15/2022   HGB 14.1 11/15/2022   HCT 41.2 11/15/2022   MCV 86 11/15/2022   PLT 125 (L) 07/09/2022    Lab Results  Component Value Date   CREATININE 1.13 11/15/2022    No results found for: "PSA"  Lab Results  Component Value Date   TESTOSTERONE 646 11/15/2022    No results found for: "HGBA1C"  Urinalysis    Component Value Date/Time   COLORURINE YELLOW 07/09/2022 2244   APPEARANCEUR Clear 09/14/2022 1040   LABSPEC 1.014 07/09/2022  2244   PHURINE 6.0 07/09/2022 2244   GLUCOSEU Negative 09/14/2022 1040   HGBUR MODERATE (A) 07/09/2022 2244   BILIRUBINUR Negative 09/14/2022 1040   KETONESUR NEGATIVE 07/09/2022 2244   PROTEINUR Negative 09/14/2022 1040   PROTEINUR NEGATIVE 07/09/2022 2244   NITRITE Negative 09/14/2022 1040   NITRITE POSITIVE (A) 07/09/2022 2244   LEUKOCYTESUR Negative 09/14/2022 1040   LEUKOCYTESUR LARGE (A) 07/09/2022 2244    Lab Results  Component Value Date   LABMICR Comment 09/14/2022   WBCUA 6-10 (A) 08/15/2022   LABEPIT None seen 08/15/2022   BACTERIA Few 08/15/2022    Pertinent Imaging:  No results found for this or any previous visit.  No results found for this or any previous visit.  No results found for this or any previous visit.  No results found for this or any previous visit.  No results found for this or any previous visit.  No valid procedures specified. No results found for this or any previous visit.  No results found for this or any previous visit.   Assessment & Plan:    1. Hypogonadism in male -We will trial jatenzo '158mg'$  BID since he is having progressive difficulty with injections.    No follow-ups on file.  [name], MD  [facility] [place]

## 2022-11-24 NOTE — Telephone Encounter (Signed)
Returned call to patient.

## 2022-11-29 ENCOUNTER — Encounter: Payer: Self-pay | Admitting: Urology

## 2022-12-26 ENCOUNTER — Other Ambulatory Visit: Payer: Self-pay | Admitting: Urology

## 2022-12-26 DIAGNOSIS — Z1211 Encounter for screening for malignant neoplasm of colon: Secondary | ICD-10-CM

## 2022-12-27 ENCOUNTER — Encounter: Payer: Self-pay | Admitting: Urology

## 2022-12-29 ENCOUNTER — Encounter (INDEPENDENT_AMBULATORY_CARE_PROVIDER_SITE_OTHER): Payer: Self-pay | Admitting: *Deleted

## 2023-01-26 ENCOUNTER — Encounter (INDEPENDENT_AMBULATORY_CARE_PROVIDER_SITE_OTHER): Payer: Self-pay | Admitting: Gastroenterology

## 2023-01-26 ENCOUNTER — Ambulatory Visit (INDEPENDENT_AMBULATORY_CARE_PROVIDER_SITE_OTHER): Payer: BC Managed Care – PPO | Admitting: Gastroenterology

## 2023-01-26 VITALS — BP 150/88 | HR 68 | Temp 98.2°F | Ht 76.0 in | Wt 270.6 lb

## 2023-01-26 DIAGNOSIS — B370 Candidal stomatitis: Secondary | ICD-10-CM | POA: Diagnosis not present

## 2023-01-26 MED ORDER — FLUCONAZOLE 200 MG PO TABS: 400.0000 mg | ORAL_TABLET | Freq: Every day | ORAL | 0 refills | Status: AC

## 2023-01-26 NOTE — Progress Notes (Signed)
Chase Wyatt, M.D. Gastroenterology & Hepatology [facility] [address] [place], [place] [address] Primary Care Physician: [name], MD [place]  Referring MD: PCP  Chief Complaint: Recurrent thrush  History of Present Illness: Chase Wyatt is a 52 y.o. male with PMH anxiety, epididymitis, GERD, who presents for evaluation of recurrent thrush.  Patient reports that for the last 2 years he has had recurrent issues with thrush after undergoing surgery. He has had 7 surgeries for the last 2 years and every time after surgery he gets thrush in his tongue.  He is concerned as after the last surgery he has persisted with "thrush related symptoms" ". He has seen an immunologist, ENT and his primary care, for recurrent thrush but he has not had complete relief with a different treatment he has received. He reports receiving the following treatment - he has received 3 rounds of 10-14 days of Diflucan - does not know the dosage. Also tried nystatin mouthwash without improvement. He has been taking omeprazole for 14-15 years for GERD, was switched to Protonix by ENT but he did not feel any difference.  Last time he had surgery was in November 2023 and he has persisted with thrush. He reports that he has noted a white coating in his tongue along with soreness in his mouth along with sensitivity when he eats certain food and with air exposure. He reports that it feels like the beginning of a sore throat. He does not have dysphagia but has persistent discomfort in his throat. He denies having heartburn.  Tends to sleep with his mouth open as he uses a CPAP.  The patient denies having any nausea, vomiting, fever, chills, hematochezia, melena, hematemesis, abdominal distention, abdominal pain, diarrhea, jaundice, pruritus. Has lost 15 lb on purpose as he gained weight after his shoulder surgery.  Last EGD:> 10 years  ago, possible gastritis but no report is available Last Colonoscopy:>10 years ago, no report but states it was normal  FHx: neg for any gastrointestinal/liver disease, mother pancreatic cancer, no malignancies but father has a history  Social: neg smoking, alcohol or illicit drug use Surgical: bilateral inguinal hernia, cholecystectomy  Past Medical History: Past Medical History:  Diagnosis Date   Anxiety    Arthritis    GERD (gastroesophageal reflux disease)    Heart murmur    Hypertension    Joint pain    Neuromuscular disorder ([place])    nerve damage right leg   PONV (postoperative nausea and vomiting)    S/P insertion of spinal cord stimulator    Sleep apnea     Past Surgical History: Past Surgical History:  Procedure Laterality Date   ANKLE DEBRIDEMENT  3 yrs ago -[place]   left   ANKLE SURGERY  2005   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   BACK SURGERY     multiple   CARDIAC CATHETERIZATION  2013   [place], no stents   CHOLECYSTECTOMY  10/28/2011   Procedure: LAPAROSCOPIC CHOLECYSTECTOMY;  Surgeon: Jamesetta So;  Location: [facility];  Service: General;  Laterality: N/A;   EPIDIDYMECTOMY Left 07/13/2022   Procedure: EPIDIDYMECTOMY;  Surgeon: Cleon Gustin, MD;  Location: [facility];  Service: Urology;  Laterality: Left;   HYDROCELE EXCISION Left 07/13/2022   Procedure: HYDROCELECTOMY ADULT;  Surgeon: Cleon Gustin, MD;  Location: [facility];  Service: Urology;  Laterality: Left;   INGUINAL HERNIA REPAIR Left 08/07/2020   Procedure: HERNIA REPAIR  INGUINAL ADULT using MESH MARLEX PLUG MEDIUM;  Surgeon: Aviva Signs, MD;  Location: [facility];  Service: General;  Laterality: Left;   INGUINAL HERNIA REPAIR Right 01/11/2021   Procedure: HERNIA REPAIR INGUINAL ADULT;  Surgeon: Aviva Signs, MD;  Location: [facility];  Service: General;  Laterality: Right;   LAMINECTOMY THORACIC SPINE W/ PLACEMENT SPINAL CORD STIMULATOR     NASAL SINUS SURGERY     SHOULDER OPEN ROTATOR CUFF  REPAIR Left    [place]   UMBILICAL HERNIA REPAIR N/A 08/07/2020   Procedure: HERNIA REPAIR UMBILICAL ADULT;  Surgeon: Aviva Signs, MD;  Location: [facility];  Service: General;  Laterality: N/A;   VARICOCELECTOMY Right 06/10/2021   Procedure: right, neurolysis-subinguial;  Surgeon: Cleon Gustin, MD;  Location: [facility];  Service: Urology;  Laterality: Right;   VARICOCELECTOMY Right 09/02/2021   Procedure: VARICOCELECTOMY-right subingunial neurolysis;  Surgeon: Cleon Gustin, MD;  [facility];  Service: Urology;  Laterality: Right;   VARICOCELECTOMY Left 04/14/2022   Procedure: VARICOCELECTOMY-left neurolysis;  Surgeon: Cleon Gustin, MD;  [facility];  Service: Urology;  Laterality: Left;   VASECTOMY N/A 09/02/2021   Procedure: VASECTOMY;  Surgeon: Cleon Gustin, MD;  [facility];  Service: Urology;  Laterality: N/A;    Family History: Family History  Problem Relation Age of Onset   Hypotension Neg Hx    Anesthesia problems Neg Hx    Malignant hyperthermia Neg Hx    Pseudochol deficiency Neg Hx     Social History: Social History   Tobacco Use  Smoking Status Never   Passive exposure: Never  Smokeless Tobacco Former   Social History   Substance and Sexual Activity  Alcohol Use Never   Alcohol/week: 1.0 standard drink of alcohol   Types: 1 Cans of beer per week   Social History   Substance and Sexual Activity  Drug Use No    Allergies: Allergies  Allergen Reactions   Benzoin Rash    Blisters   Freederm Adhesive Remover [New Skin] Itching   Meloxicam Hives   Oxycontin [Oxycodone] Other (See Comments)    Headache    Medications: Current Outpatient Medications  Medication Sig Dispense Refill   ALPRAZolam (XANAX) 0.5 MG tablet Take 0.5 mg by mouth 3 (three) times daily as needed for anxiety.     aspirin EC 81 MG tablet Take 81 mg by mouth in the morning. Swallow whole.     cetirizine (ZYRTEC) 10 MG tablet Take 10 mg by mouth in  the morning.     clotrimazole-betamethasone (LOTRISONE) cream Apply 1 Application topically 2 (two) times daily. 45 g 0   cyclobenzaprine (FLEXERIL) 10 MG tablet Take 10 mg by mouth 3 (three) times daily as needed for muscle spasms.     eletriptan (RELPAX) 40 MG tablet Take 40 mg by mouth daily as needed for migraine.     fluticasone (FLONASE) 50 MCG/ACT nasal spray Place 1 spray into both nostrils in the morning.     naproxen (NAPROSYN) 500 MG tablet Take 500 mg by mouth daily as needed for migraine.     ondansetron (ZOFRAN) 4 MG tablet Take 4 mg by mouth every 8 (eight) hours as needed (migraine related nausea).     pantoprazole (PROTONIX) 40 MG tablet Take 40 mg by mouth. One qam     tamsulosin (FLOMAX) 0.4 MG CAPS capsule Take 1 capsule (0.4 mg total) by mouth daily after supper. 30 capsule 11   testosterone cypionate (DEPOTESTOSTERONE CYPIONATE) 200 MG/ML  injection Inject 1 mL (200 mg total) into the muscle every 14 (fourteen) days. 3 mls every other week 10 mL 3   valACYclovir (VALTREX) 1000 MG tablet Take 1,000 mg by mouth daily as needed (fever blisters).     zolpidem (AMBIEN) 10 MG tablet Take 10 mg by mouth at bedtime as needed for sleep.     No current facility-administered medications for this visit.    Review of Systems: GENERAL: negative for malaise, night sweats HEENT: No changes in hearing or vision, no nose bleeds or other nasal problems. NECK: Negative for lumps, goiter, pain and significant neck swelling RESPIRATORY: Negative for cough, wheezing CARDIOVASCULAR: Negative for chest pain, leg swelling, palpitations, orthopnea GI: SEE HPI MUSCULOSKELETAL: Negative for joint pain or swelling, back pain, and muscle pain. SKIN: Negative for lesions, rash PSYCH: Negative for sleep disturbance, mood disorder and recent psychosocial stressors. HEMATOLOGY Negative for prolonged bleeding, bruising easily, and swollen nodes. ENDOCRINE: Negative for cold or heat intolerance, polyuria,  polydipsia and goiter. NEURO: negative for tremor, gait imbalance, syncope and seizures. The remainder of the review of systems is noncontributory.   Physical Exam: BP (!) 150/88   Pulse 68   Temp 98.2 F (36.8 C) (Oral)   Ht '6\' 4"'$  (1.93 m)   Wt 270 lb 9.6 oz (122.7 kg)   BMI 32.94 kg/m  GENERAL: The patient is AO x3, in no acute distress. HEENT: Head is normocephalic and atraumatic. EOMI are intact. Mouth is well hydrated . Has mild whitish discoloration of the back of the tongue but this does not come off when scraped, no erythema. NECK: Supple. No masses LUNGS: Clear to auscultation. No presence of rhonchi/wheezing/rales. Adequate chest expansion HEART: RRR, normal s1 and s2. ABDOMEN: Soft, nontender, no guarding, no peritoneal signs, and nondistended. BS +. No masses. EXTREMITIES: Without any cyanosis, clubbing, rash, lesions or edema. NEUROLOGIC: AOx3, no focal motor deficit. SKIN: no jaundice, no rashes   Imaging/Labs: as above  I personally reviewed and interpreted the available labs, imaging and endoscopic files.  Impression and Plan: Chase Wyatt is a 52 y.o. male with PMH anxiety, epididymitis, GERD, who presents for evaluation of recurrent thrush.  Patient is presenting with some mild changes suggestive of possible thrush in his tongue although not showing classical striking changes in his mouth.  As he was using possibly a lower dose of fluconazole recently, we will try 1 last course of fluconazole 400 mg daily for 3 weeks.  It is possible that the exposure to antibiotics in the perioperative timeframe may have led to this dysbiosis his tongue and lead to recurrent thrush.  However, we will rule out an HIV infection given his recurrent episodes.  Changes did not seem to be related to GERD.  He can continue with lozenge use to keep his mouth on stress as sleeping with his mouth open can promote the tongue changes.  He is at increased risk of colorectal cancer given his  family history of colonic polyps.  Will schedule him for a colonoscopy and repeat at least every 5 years.  The patient was found to have elevated blood pressure when vital signs were checked in the office. The blood pressure was rechecked by the nursing staff and it was found be persistently elevated >140/90 mmHg. I personally advised to the patient to follow up closely with PCP for hypertension control.  -Start fluconazole 400 mg qday for 3 weeks -Use lozenges 2-3 times a day to try to keep mouth moisturized -Schedule  colonoscopy -If persistent tongue and pharyngeal symptoms, may proceed with EGD. -HIV test  All questions were answered.      Chase Peppers, MD Gastroenterology and Hepatology [facility]

## 2023-01-26 NOTE — Patient Instructions (Addendum)
Start fluconazole 400 mg qday for 3 weeks Use lozenges 2-3 times a day to try to keep mouth moisturized Schedule colonoscopy The patient was found to have elevated blood pressure when vital signs were checked in the office. The blood pressure was rechecked by the nursing staff and it was found be persistently elevated >140/90 mmHg. I personally advised to the patient to follow up closely with PCP for hypertension control. Perform blood workup

## 2023-01-27 ENCOUNTER — Telehealth: Payer: Self-pay | Admitting: *Deleted

## 2023-01-27 LAB — HIV ANTIBODY (ROUTINE TESTING W REFLEX): HIV 1&2 Ab, 4th Generation: NONREACTIVE

## 2023-01-27 NOTE — Telephone Encounter (Signed)
[facility]  Colonoscopy w/Dr.Castaneda, RM 1 Family history of colon polyps

## 2023-02-01 ENCOUNTER — Telehealth: Payer: Self-pay | Admitting: *Deleted

## 2023-02-01 ENCOUNTER — Encounter: Payer: Self-pay | Admitting: *Deleted

## 2023-02-01 NOTE — Telephone Encounter (Signed)
Yes, it would be reasonable to do both together. Diagnosis: recurrent thrush Thanks

## 2023-02-01 NOTE — Telephone Encounter (Signed)
Pt has been scheduled for 03/01/23 at 9:30 am, instructions sent via MyChart and prep sent to pharmacy.

## 2023-02-01 NOTE — Telephone Encounter (Signed)
Called to schedule pt for his colonoscopy and he says that he has spoke with you regarding the issues he is having with his reflux. He says he has met his deductible and wants to know if we can schedule EGD with the colonoscopy. Please advise. Thank you

## 2023-02-17 ENCOUNTER — Telehealth (INDEPENDENT_AMBULATORY_CARE_PROVIDER_SITE_OTHER): Payer: Self-pay | Admitting: *Deleted

## 2023-02-17 NOTE — Telephone Encounter (Signed)
Pt seen 2/8 and prescribed fluconazole '400mg'$  for 3 weeks. Reports yeast in mouth is better but never completely cleared up. Finished med about a week ago. Has EGD and TCS scheduled 03/01/23.   [place].  Pt's number [phone]

## 2023-02-17 NOTE — Telephone Encounter (Signed)
Will evaluate this at time of endoscopy, please ask him to NOT brush his tongue or remove the coating on the day of the procedure so I can confirm if he indeed has yeast

## 2023-02-20 ENCOUNTER — Other Ambulatory Visit: Payer: Self-pay | Admitting: *Deleted

## 2023-02-20 MED ORDER — PEG 3350-KCL-NA BICARB-NACL 420 G PO SOLR: 4000.0000 mL | Freq: Once | ORAL | 0 refills | Status: AC

## 2023-02-20 NOTE — Telephone Encounter (Signed)
Pt notified on vm that script was sent in. Thanks.

## 2023-02-20 NOTE — Telephone Encounter (Signed)
Tammy, I see a note where it says prep was sent in on 2/14 but patient is saying the pharmacy does not have it. I was going to resend it but I dont see it on the med list.   [employer] in [place]

## 2023-02-20 NOTE — Telephone Encounter (Signed)
Discussed with patient per Dr. Jenetta Downer. He will evaluate at time of endoscopy, please ask him to NOT brush his tongue or remove the coating on the day of the procedure so I can confirm if he indeed has yeast.  Patient verbalized understanding.

## 2023-02-20 NOTE — Telephone Encounter (Signed)
I sent it in. Sorry about that

## 2023-02-22 ENCOUNTER — Ambulatory Visit (INDEPENDENT_AMBULATORY_CARE_PROVIDER_SITE_OTHER): Payer: BC Managed Care – PPO | Admitting: Urology

## 2023-02-22 VITALS — BP 188/66 | HR 62

## 2023-02-22 DIAGNOSIS — N5082 Scrotal pain: Secondary | ICD-10-CM

## 2023-02-22 DIAGNOSIS — E291 Testicular hypofunction: Secondary | ICD-10-CM

## 2023-02-22 MED ORDER — TESTOSTERONE CYPIONATE 200 MG/ML IM SOLN: 200.0000 mg | INTRAMUSCULAR | 3 refills | Status: DC

## 2023-02-22 NOTE — Progress Notes (Unsigned)
02/22/2023 9:53 AM   Chase Wyatt [birth year] [id number]  Referring provider: [name], MD [place],  [place]  Followup hypogonadism and inguinal pain   HPI: Chase Wyatt is a 52yo here for followup for hypogonadism. No recent testosterone labs. He is due for an injection tomorrow. He injects '200mg'$  every 2 weeks. He has intermittent right thigh pain with working out which is mild. Patient ewas started on BPh meds a week ago.    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   BACK SURGERY     multiple   CARDIAC CATHETERIZATION  2013   [place], no stents   CHOLECYSTECTOMY  10/28/2011   Procedure: LAPAROSCOPIC CHOLECYSTECTOMY;  Surgeon: Jamesetta So;  Location: [facility];  Service: General;  Laterality: N/A;   EPIDIDYMECTOMY Left 07/13/2022   Procedure: EPIDIDYMECTOMY;  Surgeon: Cleon Gustin, MD;  Location: [facility];  Service: Urology;  Laterality: Left;   HYDROCELE EXCISION Left 07/13/2022   Procedure: HYDROCELECTOMY ADULT;  Surgeon: Cleon Gustin, MD;  Location: [facility];  Service: Urology;  Laterality: Left;   INGUINAL HERNIA REPAIR Left 08/07/2020   Procedure: HERNIA REPAIR INGUINAL ADULT using MESH MARLEX PLUG MEDIUM;  Surgeon: Aviva Signs, MD;  Location: [facility];  Service: General;  Laterality: Left;   INGUINAL HERNIA REPAIR Right 01/11/2021   Procedure: HERNIA REPAIR INGUINAL ADULT;  Surgeon: Aviva Signs, MD;  Location: [facility];  Service: General;  Laterality: Right;    LAMINECTOMY THORACIC SPINE W/ PLACEMENT SPINAL CORD STIMULATOR     NASAL SINUS SURGERY     SHOULDER OPEN ROTATOR CUFF REPAIR Left    [place]   UMBILICAL HERNIA REPAIR N/A 08/07/2020   Procedure: HERNIA REPAIR UMBILICAL ADULT;  Surgeon: Aviva Signs, MD;  Location: [facility];  Service: General;  Laterality: N/A;   VARICOCELECTOMY Right 06/10/2021   Procedure: right, neurolysis-subinguial;  Surgeon: Cleon Gustin, MD;  Location: [facility];  Service: Urology;  Laterality: Right;   VARICOCELECTOMY Right 09/02/2021   Procedure: VARICOCELECTOMY-right subingunial neurolysis;  Surgeon: Cleon Gustin, MD;  Location: [facility];  Service: Urology;  Laterality: Right;   VARICOCELECTOMY Left 04/14/2022   Procedure: VARICOCELECTOMY-left neurolysis;  Surgeon: Cleon Gustin, MD;  [facility]: [facility];  Service: Urology;  Laterality: Left;   VASECTOMY N/A 09/02/2021   Procedure: VASECTOMY;  Surgeon: Cleon Gustin, MD;  Location: [facility];  Service: Urology;  Laterality: N/A;    Home Medications:  Allergies as of 02/22/2023       Reactions   Benzoin Rash   Blisters   Freederm Adhesive Remover [new Skin] Itching   Meloxicam Hives   Oxycontin [oxycodone] Other (See Comments)   Headache        Medication List        Accurate as of February 22, 2023  9:53 AM. If you have any questions, ask your nurse or doctor.  ALPRAZolam 0.5 MG tablet Commonly known as: XANAX Take 0.5 mg by mouth 3 (three) times daily as needed for anxiety.   aspirin EC 81 MG tablet Take 81 mg by mouth in the morning. Swallow whole.   cetirizine 10 MG tablet Commonly known as: ZYRTEC Take 10 mg by mouth in the morning.   clotrimazole-betamethasone cream Commonly known as: Lotrisone Apply 1 Application topically 2 (two) times daily.   cyclobenzaprine 10 MG tablet Commonly known as: FLEXERIL Take 10 mg by mouth 3 (three) times daily as needed for muscle spasms.   eletriptan 40 MG tablet Commonly known  as: RELPAX Take 40 mg by mouth daily as needed for migraine.   fluticasone 50 MCG/ACT nasal spray Commonly known as: FLONASE Place 1 spray into both nostrils in the morning.   naproxen 500 MG tablet Commonly known as: NAPROSYN Take 500 mg by mouth daily as needed for migraine.   ondansetron 4 MG tablet Commonly known as: ZOFRAN Take 4 mg by mouth every 8 (eight) hours as needed (migraine related nausea).   pantoprazole 40 MG tablet Commonly known as: PROTONIX Take 40 mg by mouth. One qam   tamsulosin 0.4 MG Caps capsule Commonly known as: Flomax Take 1 capsule (0.4 mg total) by mouth daily after supper.   testosterone cypionate 200 MG/ML injection Commonly known as: DEPOTESTOSTERONE CYPIONATE Inject 1 mL (200 mg total) into the muscle every 14 (fourteen) days. 3 mls every other week   valACYclovir 1000 MG tablet Commonly known as: VALTREX Take 1,000 mg by mouth daily as needed (fever blisters).   zolpidem 10 MG tablet Commonly known as: AMBIEN Take 10 mg by mouth at bedtime as needed for sleep.        Allergies:  Allergies  Allergen Reactions   Benzoin Rash    Blisters   Freederm Adhesive Remover [New Skin] Itching   Meloxicam Hives   Oxycontin [Oxycodone] Other (See Comments)    Headache    Family History: Family History  Problem Relation Age of Onset   Hypotension Neg Hx    Anesthesia problems Neg Hx    Malignant hyperthermia Neg Hx    Pseudochol deficiency Neg Hx     Social History:  reports that he has never smoked. He has never been exposed to tobacco smoke. He has quit using smokeless tobacco. He reports that he does not drink alcohol and does not use drugs.  ROS: All other review of systems were reviewed and are negative except what is noted above in HPI  Physical Exam: BP (!) 188/66   Pulse 62   Constitutional:  Alert and oriented, No acute distress. HEENT: [place] AT, moist mucus membranes.  Trachea midline, no masses. Cardiovascular: No  clubbing, cyanosis, or edema. Respiratory: Normal respiratory effort, no increased work of breathing. GI: Abdomen is soft, nontender, nondistended, no abdominal masses GU: No CVA tenderness.  Lymph: No cervical or inguinal lymphadenopathy. Skin: No rashes, bruises or suspicious lesions. Neurologic: Grossly intact, no focal deficits, moving all 4 extremities. Psychiatric: Normal mood and affect.  Laboratory Data: Lab Results  Component Value Date   WBC 4.8 11/15/2022   HGB 14.1 11/15/2022   HCT 41.2 11/15/2022   MCV 86 11/15/2022   PLT 125 (L) 07/09/2022    Lab Results  Component Value Date   CREATININE 1.13 11/15/2022    No results found for: "PSA"  Lab Results  Component Value Date   TESTOSTERONE 646 11/15/2022    No results found for: "HGBA1C"  Urinalysis    Component Value Date/Time   COLORURINE YELLOW 07/09/2022 2244   APPEARANCEUR Clear 09/14/2022 1040   LABSPEC 1.014 07/09/2022 2244   PHURINE 6.0 07/09/2022 2244   GLUCOSEU Negative 09/14/2022 1040   HGBUR MODERATE (A) 07/09/2022 2244   BILIRUBINUR Negative 09/14/2022 1040   KETONESUR NEGATIVE 07/09/2022 2244   PROTEINUR Negative 09/14/2022 1040   PROTEINUR NEGATIVE 07/09/2022 2244   NITRITE Negative 09/14/2022 1040   NITRITE POSITIVE (A) 07/09/2022 2244   LEUKOCYTESUR Negative 09/14/2022 1040   LEUKOCYTESUR LARGE (A) 07/09/2022 2244    Lab Results  Component Value Date   LABMICR Comment 09/14/2022   WBCUA 6-10 (A) 08/15/2022   LABEPIT None seen 08/15/2022   BACTERIA Few 08/15/2022    Pertinent Imaging: *** No results found for this or any previous visit.  No results found for this or any previous visit.  No results found for this or any previous visit.  No results found for this or any previous visit.  No results found for this or any previous visit.  No valid procedures specified. No results found for this or any previous visit.  No results found for this or any previous  visit.   Assessment & Plan:    1. Hypogonadism in male ***  2. Scrotal pain ***   No follow-ups on file.  [name], MD  [facility] [place] [facility] [place]

## 2023-02-23 ENCOUNTER — Encounter: Payer: Self-pay | Admitting: Urology

## 2023-02-23 NOTE — Telephone Encounter (Signed)
[place] PA for EGD: Order ID: [id number]       Authorized  Approval Valid Through: 02/23/2023 - 04/23/2023

## 2023-02-23 NOTE — Patient Instructions (Signed)

## 2023-02-25 LAB — TESTOSTERONE,FREE AND TOTAL
Testosterone, Free: 3.7 pg/mL — ABNORMAL LOW (ref 7.2–24.0)
Testosterone: 187 ng/dL — ABNORMAL LOW (ref 264–916)

## 2023-03-01 ENCOUNTER — Encounter (HOSPITAL_COMMUNITY)
Admission: RE | Disposition: A | Discharge status: Home / Self Care | Payer: Self-pay | Source: Home / Self Care | Attending: Gastroenterology

## 2023-03-01 ENCOUNTER — Ambulatory Visit (HOSPITAL_COMMUNITY)
Admission: RE | Admit: 2023-03-01 | Discharge: 2023-03-01 | Disposition: A | Discharge status: Home / Self Care | Payer: BC Managed Care – PPO | Attending: Gastroenterology | Admitting: Gastroenterology

## 2023-03-01 ENCOUNTER — Other Ambulatory Visit: Payer: Self-pay

## 2023-03-01 ENCOUNTER — Ambulatory Visit (HOSPITAL_COMMUNITY): Payer: BC Managed Care – PPO | Admitting: Anesthesiology

## 2023-03-01 ENCOUNTER — Other Ambulatory Visit (INDEPENDENT_AMBULATORY_CARE_PROVIDER_SITE_OTHER): Payer: Self-pay | Admitting: Gastroenterology

## 2023-03-01 ENCOUNTER — Encounter (HOSPITAL_COMMUNITY): Payer: Self-pay | Admitting: Gastroenterology

## 2023-03-01 ENCOUNTER — Encounter: Payer: Self-pay | Admitting: Urology

## 2023-03-01 ENCOUNTER — Encounter (INDEPENDENT_AMBULATORY_CARE_PROVIDER_SITE_OTHER): Payer: Self-pay | Admitting: *Deleted

## 2023-03-01 DIAGNOSIS — G4730 Sleep apnea, unspecified: Secondary | ICD-10-CM | POA: Insufficient documentation

## 2023-03-01 DIAGNOSIS — Z1211 Encounter for screening for malignant neoplasm of colon: Secondary | ICD-10-CM

## 2023-03-01 DIAGNOSIS — K219 Gastro-esophageal reflux disease without esophagitis: Secondary | ICD-10-CM

## 2023-03-01 DIAGNOSIS — K2289 Other specified disease of esophagus: Secondary | ICD-10-CM | POA: Diagnosis not present

## 2023-03-01 DIAGNOSIS — K635 Polyp of colon: Secondary | ICD-10-CM | POA: Diagnosis not present

## 2023-03-01 DIAGNOSIS — K3189 Other diseases of stomach and duodenum: Secondary | ICD-10-CM | POA: Diagnosis not present

## 2023-03-01 DIAGNOSIS — B370 Candidal stomatitis: Secondary | ICD-10-CM | POA: Diagnosis not present

## 2023-03-01 DIAGNOSIS — R079 Chest pain, unspecified: Secondary | ICD-10-CM | POA: Insufficient documentation

## 2023-03-01 DIAGNOSIS — I10 Essential (primary) hypertension: Secondary | ICD-10-CM | POA: Diagnosis not present

## 2023-03-01 DIAGNOSIS — K449 Diaphragmatic hernia without obstruction or gangrene: Secondary | ICD-10-CM | POA: Diagnosis not present

## 2023-03-01 DIAGNOSIS — F419 Anxiety disorder, unspecified: Secondary | ICD-10-CM | POA: Insufficient documentation

## 2023-03-01 DIAGNOSIS — K2270 Barrett's esophagus without dysplasia: Secondary | ICD-10-CM | POA: Diagnosis not present

## 2023-03-01 DIAGNOSIS — K2950 Unspecified chronic gastritis without bleeding: Secondary | ICD-10-CM | POA: Insufficient documentation

## 2023-03-01 HISTORY — PX: ESOPHAGOGASTRODUODENOSCOPY (EGD) WITH PROPOFOL: SHX5813

## 2023-03-01 HISTORY — PX: BIOPSY: SHX5522

## 2023-03-01 HISTORY — PX: POLYPECTOMY: SHX5525

## 2023-03-01 HISTORY — PX: ESOPHAGEAL BRUSHING: SHX6842

## 2023-03-01 HISTORY — PX: COLONOSCOPY WITH PROPOFOL: SHX5780

## 2023-03-01 LAB — HM COLONOSCOPY

## 2023-03-01 LAB — KOH PREP: KOH Prep: NONE SEEN

## 2023-03-01 SURGERY — ESOPHAGOGASTRODUODENOSCOPY (EGD) WITH PROPOFOL
Anesthesia: Monitor Anesthesia Care

## 2023-03-01 SURGERY — COLONOSCOPY WITH PROPOFOL
Anesthesia: General

## 2023-03-01 MED ORDER — PROPOFOL 500 MG/50ML IV EMUL
INTRAVENOUS | Status: DC | PRN
  Administered 2023-03-01: 250 ug/kg/min via INTRAVENOUS

## 2023-03-01 MED ORDER — SODIUM CHLORIDE 0.9 % IV SOLN
INTRAVENOUS | Status: DC
  Administered 2023-03-01: 1000 mL via INTRAVENOUS

## 2023-03-01 MED ORDER — PHENYLEPHRINE HCL (PRESSORS) 10 MG/ML IV SOLN
INTRAVENOUS | Status: DC | PRN
  Administered 2023-03-01: 160 ug via INTRAVENOUS

## 2023-03-01 MED ORDER — PROPOFOL 10 MG/ML IV BOLUS
INTRAVENOUS | Status: DC | PRN
  Administered 2023-03-01: 100 mg via INTRAVENOUS
  Administered 2023-03-01: 80 mg via INTRAVENOUS

## 2023-03-01 MED ORDER — PANTOPRAZOLE SODIUM 40 MG PO TBEC: 40.0000 mg | DELAYED_RELEASE_TABLET | Freq: Every day | ORAL | 3 refills | Status: DC

## 2023-03-01 MED ORDER — LIDOCAINE HCL (CARDIAC) PF 100 MG/5ML IV SOSY
PREFILLED_SYRINGE | INTRAVENOUS | Status: DC | PRN
  Administered 2023-03-01: 60 mg via INTRATRACHEAL

## 2023-03-01 NOTE — H&P (Signed)
Chase Wyatt is an 52 y.o. male.   Chief Complaint: oral thrush, chest pain and CRC screening  HPI: Chase Wyatt is a 52 y.o. male with PMH anxiety, epididymitis, GERD, who presents for evaluation of oral thrush, chest pain and CRC screening .  Patient reports recurrent coating on his tongue despite taking different antifungal regimens.  Denies any nausea, vomiting, fever, chills, melena, hematochezia.  Last EGD:> 10 years ago, possible gastritis but no report is available Last Colonoscopy:>10 years ago, no report but states it was normal   FHx: neg for any gastrointestinal/liver disease, mother pancreatic cancer, no malignancies but father has a history  Social: neg smoking, alcohol or illicit drug use Surgical: bilateral inguinal hernia, cholecystectomy  Past Medical History:  Diagnosis Date   Anxiety    Arthritis    GERD (gastroesophageal reflux disease)    Heart murmur    Hypertension    Joint pain    Neuromuscular disorder ([place])    nerve damage right leg   PONV (postoperative nausea and vomiting)    S/P insertion of spinal cord stimulator    Sleep apnea     Past Surgical History:  Procedure Laterality Date   ANKLE DEBRIDEMENT  3 yrs ago -[place]   left   ANKLE SURGERY  2005   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   BACK SURGERY     multiple   CARDIAC CATHETERIZATION  2013   [place], no stents   CHOLECYSTECTOMY  10/28/2011   Procedure: LAPAROSCOPIC CHOLECYSTECTOMY;  Surgeon: Jamesetta So;  Location: [facility];  Service: General;  Laterality: N/A;   EPIDIDYMECTOMY Left 07/13/2022   Procedure: EPIDIDYMECTOMY;  Surgeon: Cleon Gustin, MD;  Location: [facility];  Service: Urology;  Laterality: Left;   HYDROCELE EXCISION Left 07/13/2022   Procedure: HYDROCELECTOMY ADULT;  Surgeon: Cleon Gustin, MD;  Location: [facility];  Service: Urology;  Laterality: Left;   INGUINAL HERNIA REPAIR Left 08/07/2020   Procedure: HERNIA REPAIR INGUINAL ADULT using MESH MARLEX  PLUG MEDIUM;  Surgeon: Aviva Signs, MD;  Location: [facility];  Service: General;  Laterality: Left;   INGUINAL HERNIA REPAIR Right 01/11/2021   Procedure: HERNIA REPAIR INGUINAL ADULT;  Surgeon: Aviva Signs, MD;  Location: [facility];  Service: General;  Laterality: Right;   LAMINECTOMY THORACIC SPINE W/ PLACEMENT SPINAL CORD STIMULATOR     NASAL SINUS SURGERY     SHOULDER OPEN ROTATOR CUFF REPAIR Left    [place]   UMBILICAL HERNIA REPAIR N/A 08/07/2020   Procedure: HERNIA REPAIR UMBILICAL ADULT;  Surgeon: Aviva Signs, MD;  Location: [facility];  Service: General;  Laterality: N/A;   VARICOCELECTOMY Right 06/10/2021   Procedure: right, neurolysis-subinguial;  Surgeon: Cleon Gustin, MD;  Location: [facility];  Service: Urology;  Laterality: Right;   VARICOCELECTOMY Right 09/02/2021   Procedure: VARICOCELECTOMY-right subingunial neurolysis;  Surgeon: Cleon Gustin, MD;  Location: [facility];  Service: Urology;  Laterality: Right;   VARICOCELECTOMY Left 04/14/2022   Procedure: VARICOCELECTOMY-left neurolysis;  Surgeon: Cleon Gustin, MD;  Location: [facility];  Service: Urology;  Laterality: Left;   VASECTOMY N/A 09/02/2021   Procedure: VASECTOMY;  Surgeon: Cleon Gustin, MD;  Location: [facility];  Service: Urology;  Laterality: N/A;    Family History  Problem Relation Age of Onset   Hypotension Neg Hx    Anesthesia problems Neg Hx    Malignant hyperthermia Neg Hx    Pseudochol deficiency Neg Hx    Social History:  reports that he has never smoked. He has never been exposed to tobacco smoke. He has quit using smokeless tobacco. He reports that he does not drink alcohol and does not use drugs.  Allergies:  Allergies  Allergen Reactions   Benzoin Rash    Blisters   Freederm Adhesive Remover [New Skin] Itching   Meloxicam Hives   Oxycontin [Oxycodone] Other (See Comments)    Headache    Medications Prior to Admission  Medication Sig Dispense Refill   ALPRAZolam (XANAX) 0.5 MG  tablet Take 0.5 mg by mouth 3 (three) times daily as needed for anxiety.     aspirin EC 81 MG tablet Take 81 mg by mouth in the morning. Swallow whole.     cetirizine (ZYRTEC) 10 MG tablet Take 10 mg by mouth in the morning.     cloNIDine (CATAPRES) 0.2 MG tablet Take 0.2 mg by mouth at bedtime.     eletriptan (RELPAX) 40 MG tablet Take 40 mg by mouth daily as needed for migraine.     fluticasone (FLONASE) 50 MCG/ACT nasal spray Place 1 spray into both nostrils in the morning.     ibuprofen (ADVIL) 200 MG tablet Take 400 mg by mouth every 6 (six) hours as needed for moderate pain or headache.     itraconazole (SPORANOX) 100 MG capsule Take 100 mg by mouth 2 (two) times daily.     Multiple Vitamin (MULTIVITAMIN WITH MINERALS) TABS tablet Take 1 tablet by mouth daily.     naproxen (NAPROSYN) 500 MG tablet Take 500 mg by mouth daily as needed for migraine.     nystatin (MYCOSTATIN) 100000 UNIT/ML suspension Take 5 mLs by mouth 4 (four) times daily.     pantoprazole (PROTONIX) 40 MG tablet Take 40 mg by mouth daily.     tamsulosin (FLOMAX) 0.4 MG CAPS capsule Take 1 capsule (0.4 mg total) by mouth daily after supper. 30 capsule 11   testosterone cypionate (DEPOTESTOSTERONE CYPIONATE) 200 MG/ML injection Inject 1 mL (200 mg total) into the muscle every 14 (fourteen) days. 10 mL 3   valACYclovir (VALTREX) 1000 MG tablet Take 1,000 mg by mouth 2 (two) times daily as needed (fever blisters).     valsartan (DIOVAN) 80 MG tablet Take 80 mg by mouth daily.     clotrimazole-betamethasone (LOTRISONE) cream Apply 1 Application topically 2 (two) times daily. (Patient taking differently: Apply 1 Application topically 2 (two) times daily as needed (rash).) 45 g 0   Menthol, Topical Analgesic, (ICY HOT EX) Apply 1 Application topically daily as needed (pain).     ondansetron (ZOFRAN) 4 MG tablet Take 4 mg by mouth every 8 (eight) hours as needed (migraine related nausea).      No results found for this or any  previous visit (from the past 48 hour(s)). No results found.  Review of Systems  All other systems reviewed and are negative.   Blood pressure 133/73, pulse 61, temperature 98.1 F (36.7 C), temperature source Oral, resp. rate 12, height '6\' 4"'$  (1.93 m), weight 115.7 kg, SpO2 98 %. Physical Exam  GENERAL: The patient is AO x3, in no acute distress. HEENT: Head is normocephalic and atraumatic. EOMI are intact. Mouth is well hydrated and without lesions. NECK: Supple. No masses LUNGS: Clear to auscultation. No presence of rhonchi/wheezing/rales. Adequate chest expansion HEART: RRR, normal s1 and s2. ABDOMEN: Soft, nontender, no guarding, no peritoneal signs, and nondistended. BS +. No masses. EXTREMITIES: Without any cyanosis, clubbing, rash, lesions or edema. NEUROLOGIC: AOx3,  no focal motor deficit. SKIN: no jaundice, no rashes  Assessment/Plan  Chase Wyatt is a 52 y.o. male with PMH anxiety, epididymitis, GERD, who presents for evaluation of oral thrush, chest pain and CRC screening .  Will proceed with EGD and colonoscopy.  Harvel Quale, MD 03/01/2023, 9:33 AM

## 2023-03-01 NOTE — Op Note (Signed)
[facility] Patient Name: Chase Wyatt Procedure Date: 03/01/2023 9:20 AM MRN: [id number] Date of Birth: [birth year] Attending MD: Maylon Peppers , , [id number] CSN: [id number] Age: 52 Admit Type: Outpatient Procedure:                Upper GI endoscopy Indications:              Suspected gastro-esophageal reflux disease, Oral                            thrush Providers:                Maylon Peppers, Caprice Kluver, Aram Candela Referring MD:              Medicines:                Monitored Anesthesia Care Complications:            No immediate complications. Estimated Blood Loss:     Estimated blood loss: none. Procedure:                Pre-Anesthesia Assessment:                           - Prior to the procedure, a History and Physical                            was performed, and patient medications, allergies                            and sensitivities were reviewed. The patient's                            tolerance of previous anesthesia was reviewed.                           - The risks and benefits of the procedure and the                            sedation options and risks were discussed with the                            patient. All questions were answered and informed                            consent was obtained.                           - ASA Grade Assessment: II - A patient with mild                            systemic disease.                           After obtaining informed consent, the endoscope was                            passed under direct vision. Throughout the  procedure, the patient's blood pressure, pulse, and                            oxygen saturations were monitored continuously. The                            [id number] scope was introduced through the                            mouth, and advanced to the second part of duodenum.                            The upper GI endoscopy was accomplished  without                            difficulty. The patient tolerated the procedure                            well. Scope In: 9:39:27 AM Scope Out: 9:48:27 AM Total Procedure Duration: 0 hours 9 minutes 0 seconds  Findings:      The oropharynx was normal. No thrush was visualized.      The upper and middle third of the esophagus was normal. Brushings were       obtained with a cytology brush.      Three tongues of salmon-colored mucosa were present from 43 to 44 cm. No       other visible abnormalities were present. The maximum longitudinal       extent of these esophageal mucosal changes was 1 cm in length. Biopsies       were taken with a cold forceps for histology.      A 1 cm hiatal hernia was present.      The gastroesophageal flap valve was visualized endoscopically and       classified as Hill Grade III (minimal fold, loose to endoscope, hiatal       hernia likely).      Localized mildly erythematous mucosa without bleeding was found in the       gastric antrum. Biopsies were taken with a cold forceps for Helicobacter       pylori testing.      The examined duodenum was normal. Impression:               - Normal oropharynx.                           - Normal upper and mid third of esophagus.                            Brushings performed                           - Salmon-colored mucosa. Biopsied.                           - 1 cm hiatal hernia.                           - Erythematous  mucosa in the antrum. Biopsied.                           - Normal examined duodenum. Moderate Sedation:      Per Anesthesia Care Recommendation:           - Discharge patient to home (ambulatory).                           - Resume previous diet.                           - Await pathology results.                           - Continue present medications. Procedure Code(s):        --- Professional ---                           [phone], Esophagogastroduodenoscopy, flexible,                             transoral; with biopsy, single or multiple Diagnosis Code(s):        --- Professional ---                           K22.89, Other specified disease of esophagus                           K44.9, Diaphragmatic hernia without obstruction or                            gangrene                           K31.89, Other diseases of stomach and duodenum CPT copyright 2022 American Medical Association. All rights reserved. The codes documented in this report are preliminary and upon coder review may  be revised to meet current compliance requirements. Maylon Peppers, MD Maylon Peppers,  03/01/2023 10:15:54 AM This report has been signed electronically. Number of Addenda: 0

## 2023-03-01 NOTE — Discharge Instructions (Signed)
You are being discharged to home.  °Resume your previous diet.  °We are waiting for your pathology results.  °Continue your present medications.  °Your physician has recommended a repeat colonoscopy for surveillance based on pathology results.  °

## 2023-03-01 NOTE — Transfer of Care (Signed)
Immediate Anesthesia Transfer of Care Note  Patient: ISEAH NORWICK  Procedure(s) Performed: COLONOSCOPY WITH PROPOFOL ESOPHAGOGASTRODUODENOSCOPY (EGD) WITH PROPOFOL BIOPSY ESOPHAGEAL BRUSHING POLYPECTOMY  Patient Location: Short Stay  Anesthesia Type:General  Level of Consciousness: sedated  Airway & Oxygen Therapy: Patient Spontanous Breathing  Post-op Assessment: Report given to RN and Post -op Vital signs reviewed and stable  Post vital signs: Reviewed and stable  Last Vitals:  Vitals Value Taken Time  BP 98/51 03/01/23 1016  Temp 36.4 C 03/01/23 1016  Pulse 59 03/01/23 1016  Resp 14 03/01/23 1016  SpO2 96 % 03/01/23 1016    Last Pain:  Vitals:   03/01/23 1016  TempSrc: Axillary  PainSc: 0-No pain      Patients Stated Pain Goal: 6 ([id number])  Complications: No notable events documented.

## 2023-03-01 NOTE — Anesthesia Preprocedure Evaluation (Signed)
Anesthesia Evaluation  Patient identified by MRN, date of birth, ID band Patient awake    Reviewed: Allergy & Precautions, H&P , NPO status , Patient's Chart, lab work & pertinent test results, reviewed documented beta blocker date and time   History of Anesthesia Complications (+) PONV and history of anesthetic complications  Airway Mallampati: II  TM Distance: >3 FB Neck ROM: full    Dental no notable dental hx.    Pulmonary neg pulmonary ROS, sleep apnea    Pulmonary exam normal breath sounds clear to auscultation       Cardiovascular Exercise Tolerance: Good hypertension, negative cardio ROS + Valvular Problems/Murmurs  Rhythm:regular Rate:Normal     Neuro/Psych   Anxiety      Neuromuscular disease negative neurological ROS  negative psych ROS   GI/Hepatic negative GI ROS, Neg liver ROS,GERD  ,,  Endo/Other  negative endocrine ROS    Renal/GU negative Renal ROS  negative genitourinary   Musculoskeletal   Abdominal   Peds  Hematology negative hematology ROS (+)   Anesthesia Other Findings   Reproductive/Obstetrics negative OB ROS                             Anesthesia Physical Anesthesia Plan  ASA: 3  Anesthesia Plan: General   Post-op Pain Management:    Induction:   PONV Risk Score and Plan: Propofol infusion  Airway Management Planned:   Additional Equipment:   Intra-op Plan:   Post-operative Plan:   Informed Consent: I have reviewed the patients History and Physical, chart, labs and discussed the procedure including the risks, benefits and alternatives for the proposed anesthesia with the patient or authorized representative who has indicated his/her understanding and acceptance.     Dental Advisory Given  Plan Discussed with: CRNA  Anesthesia Plan Comments:        Anesthesia Quick Evaluation

## 2023-03-01 NOTE — Op Note (Signed)
[facility] Patient Name: Chase Wyatt Procedure Date: 03/01/2023 9:18 AM MRN: [id number] Date of Birth: [birth year] Attending MD: Maylon Peppers , , [id number] CSN: [id number] Age: 52 Admit Type: Outpatient Procedure:                Colonoscopy Indications:              Screening for colorectal malignant neoplasm Providers:                Maylon Peppers, Caprice Kluver, Aram Candela Referring MD:              Medicines:                Monitored Anesthesia Care Complications:            No immediate complications. Estimated Blood Loss:     Estimated blood loss: none. Procedure:                Pre-Anesthesia Assessment:                           - Prior to the procedure, a History and Physical                            was performed, and patient medications, allergies                            and sensitivities were reviewed. The patient's                            tolerance of previous anesthesia was reviewed.                           - The risks and benefits of the procedure and the                            sedation options and risks were discussed with the                            patient. All questions were answered and informed                            consent was obtained.                           - ASA Grade Assessment: II - A patient with mild                            systemic disease.                           After obtaining informed consent, the colonoscope                            was passed under direct vision. Throughout the                            procedure, the patient's blood pressure,  pulse, and                            oxygen saturations were monitored continuously. The                            [id number]) was introduced through the                            anus and advanced to the the cecum, identified by                            appendiceal orifice and ileocecal valve. The                            colonoscopy was  performed without difficulty. The                            patient tolerated the procedure well. The quality                            of the bowel preparation was good. Scope In: 9:53:38 AM Scope Out: 10:12:44 AM Scope Withdrawal Time: 0 hours 16 minutes 45 seconds  Total Procedure Duration: 0 hours 19 minutes 6 seconds  Findings:      The perianal and digital rectal examinations were normal.      A 3 mm polyp was found in the sigmoid colon. The polyp was sessile. The       polyp was removed with a cold snare. Resection and retrieval were       complete.      The retroflexed view of the distal rectum and anal verge was normal and       showed no anal or rectal abnormalities. Impression:               - One 3 mm polyp in the sigmoid colon, removed with                            a cold snare. Resected and retrieved.                           - The distal rectum and anal verge are normal on                            retroflexion view. Moderate Sedation:      Per Anesthesia Care Recommendation:           - Discharge patient to home (ambulatory).                           - Resume previous diet.                           - Await pathology results.                           - Repeat colonoscopy for surveillance based on  pathology results. Procedure Code(s):        --- Professional ---                           [phone], Colonoscopy, flexible; with removal of                            tumor(s), polyp(s), or other lesion(s) by snare                            technique Diagnosis Code(s):        --- Professional ---                           Z12.11, Encounter for screening for malignant                            neoplasm of colon                           D12.5, Benign neoplasm of sigmoid colon CPT copyright 2022 American Medical Association. All rights reserved. The codes documented in this report are preliminary and upon coder review may  be revised to meet  current compliance requirements. Maylon Peppers, MD Maylon Peppers,  03/01/2023 10:17:54 AM This report has been signed electronically. Number of Addenda: 0

## 2023-03-02 NOTE — Anesthesia Postprocedure Evaluation (Signed)
Anesthesia Post Note  Patient: Chase Wyatt  Procedure(s) Performed: COLONOSCOPY WITH PROPOFOL ESOPHAGOGASTRODUODENOSCOPY (EGD) WITH PROPOFOL BIOPSY ESOPHAGEAL BRUSHING POLYPECTOMY  Patient location during evaluation: Phase II Anesthesia Type: General Level of consciousness: awake Pain management: pain level controlled Vital Signs Assessment: post-procedure vital signs reviewed and stable Respiratory status: spontaneous breathing and respiratory function stable Cardiovascular status: blood pressure returned to baseline and stable Postop Assessment: no headache and no apparent nausea or vomiting Anesthetic complications: no Comments: Late entry   No notable events documented.   Last Vitals:  Vitals:   03/01/23 0821 03/01/23 1016  BP: 133/73 (!) 98/51  Pulse: 61 (!) 59  Resp: 12 14  Temp: 36.7 C 36.4 C  SpO2: 98% 96%    Last Pain:  Vitals:   03/01/23 1016  TempSrc: Axillary  PainSc: 0-No pain                 Louann Sjogren

## 2023-03-03 LAB — SURGICAL PATHOLOGY

## 2023-03-09 NOTE — Telephone Encounter (Signed)
See below

## 2023-03-10 ENCOUNTER — Encounter (HOSPITAL_COMMUNITY): Payer: Self-pay | Admitting: Gastroenterology

## 2023-03-10 ENCOUNTER — Other Ambulatory Visit: Payer: Self-pay | Admitting: Urology

## 2023-03-10 MED ORDER — TESTOSTERONE CYPIONATE 200 MG/ML IM SOLN: 300.0000 mg | INTRAMUSCULAR | 3 refills | Status: DC

## 2023-05-17 ENCOUNTER — Encounter: Payer: Self-pay | Admitting: Urology

## 2023-05-18 ENCOUNTER — Other Ambulatory Visit: Payer: Self-pay

## 2023-05-18 MED ORDER — ALFUZOSIN HCL ER 10 MG PO TB24: 10.0000 mg | ORAL_TABLET | Freq: Every day | ORAL | 11 refills | Status: DC

## 2023-08-18 ENCOUNTER — Other Ambulatory Visit: Payer: BC Managed Care – PPO

## 2023-08-18 DIAGNOSIS — E291 Testicular hypofunction: Secondary | ICD-10-CM

## 2023-08-21 LAB — COMPREHENSIVE METABOLIC PANEL
ALT: 45 IU/L — ABNORMAL HIGH (ref 0–44)
AST: 45 IU/L — ABNORMAL HIGH (ref 0–40)
Albumin: 4.5 g/dL (ref 3.8–4.9)
Alkaline Phosphatase: 57 IU/L (ref 44–121)
BUN/Creatinine Ratio: 13 (ref 9–20)
BUN: 16 mg/dL (ref 6–24)
Bilirubin Total: 0.4 mg/dL (ref 0.0–1.2)
CO2: 24 mmol/L (ref 20–29)
Calcium: 9.2 mg/dL (ref 8.7–10.2)
Chloride: 103 mmol/L (ref 96–106)
Creatinine, Ser: 1.24 mg/dL (ref 0.76–1.27)
Globulin, Total: 2.2 g/dL (ref 1.5–4.5)
Glucose: 108 mg/dL — ABNORMAL HIGH (ref 70–99)
Potassium: 4.2 mmol/L (ref 3.5–5.2)
Sodium: 142 mmol/L (ref 134–144)
Total Protein: 6.7 g/dL (ref 6.0–8.5)
eGFR: 70 mL/min/{1.73_m2} (ref >59)

## 2023-08-21 LAB — CBC
Hematocrit: 40.9 % (ref 37.5–51.0)
Hemoglobin: 13.9 g/dL (ref 13.0–17.7)
MCH: 29.9 pg (ref 26.6–33.0)
MCHC: 34 g/dL (ref 31.5–35.7)
MCV: 88 fL (ref 79–97)
Platelets: 129 10*3/uL — ABNORMAL LOW (ref 150–450)
RBC: 4.65 x10E6/uL (ref 4.14–5.80)
RDW: 13.3 % (ref 11.6–15.4)
WBC: 4.5 10*3/uL (ref 3.4–10.8)

## 2023-08-21 LAB — TESTOSTERONE,FREE AND TOTAL
Testosterone, Free: 12.5 pg/mL (ref 7.2–24.0)
Testosterone: 542 ng/dL (ref 264–916)

## 2023-08-25 ENCOUNTER — Ambulatory Visit: Payer: BC Managed Care – PPO | Admitting: Urology

## 2023-08-25 VITALS — BP 154/83 | HR 61

## 2023-08-25 DIAGNOSIS — E291 Testicular hypofunction: Secondary | ICD-10-CM

## 2023-08-25 MED ORDER — TESTOSTERONE CYPIONATE 200 MG/ML IM SOLN: 300.0000 mg | INTRAMUSCULAR | 3 refills | Status: DC

## 2023-08-25 NOTE — Progress Notes (Unsigned)
08/25/2023 9:19 AM   Chase Wyatt 08-14-1971 [id number]  Referring provider: Maximiano Coss, MD [address] [place],  [place]  Followup hypogonadism   HPI: Testosterone 542. Hemoglobin and CMp normal. Good energy. IPPS 3 QOl 0.    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   BACK SURGERY     multiple   BIOPSY  03/01/2023   Procedure: BIOPSY;  Surgeon: Dolores Frame, MD;  [facility];  Service: Gastroenterology;;   CARDIAC CATHETERIZATION  2013   [place], no stents   CHOLECYSTECTOMY  10/28/2011   Procedure: LAPAROSCOPIC CHOLECYSTECTOMY;  Surgeon: Dalia Heading;  Location: [facility];  Service: General;  Laterality: N/A;   COLONOSCOPY WITH PROPOFOL N/A 03/01/2023   Procedure: COLONOSCOPY WITH PROPOFOL;  Surgeon: Dolores Frame, MD;  [facility];  Service: Gastroenterology;  Laterality: N/A;  9:30 am   EPIDIDYMECTOMY Left 07/13/2022   Procedure: EPIDIDYMECTOMY;  Surgeon: Malen Gauze, MD;  [facility];  Service: Urology;  Laterality: Left;   ESOPHAGEAL BRUSHING  03/01/2023   Procedure: ESOPHAGEAL BRUSHING;  Surgeon: Marguerita Merles, Reuel Boom, MD;  [facility];  Service: Gastroenterology;;   ESOPHAGOGASTRODUODENOSCOPY (EGD) WITH PROPOFOL N/A 03/01/2023   Procedure: ESOPHAGOGASTRODUODENOSCOPY (EGD) WITH PROPOFOL;  Surgeon: Dolores Frame, MD;  [facility];  Service: Gastroenterology;  Laterality: N/A;   HYDROCELE  EXCISION Left 07/13/2022   Procedure: HYDROCELECTOMY ADULT;  Surgeon: Malen Gauze, MD;  [facility];  Service: Urology;  Laterality: Left;   INGUINAL HERNIA REPAIR Left 08/07/2020   Procedure: HERNIA REPAIR INGUINAL ADULT using MESH MARLEX PLUG MEDIUM;  Surgeon: Franky Macho, MD;  [facility];  Service: General;  Laterality: Left;   INGUINAL HERNIA REPAIR Right 01/11/2021   Procedure: HERNIA REPAIR INGUINAL ADULT;  Surgeon: Franky Macho, MD;  [facility];  Service: General;  Laterality: Right;   LAMINECTOMY THORACIC SPINE W/ PLACEMENT SPINAL CORD STIMULATOR     NASAL SINUS SURGERY     POLYPECTOMY  03/01/2023   Procedure: POLYPECTOMY;  Surgeon: Dolores Frame, MD;  [facility];  Service: Gastroenterology;;   SHOULDER OPEN ROTATOR CUFF REPAIR Left    [place]   UMBILICAL HERNIA REPAIR N/A 08/07/2020   Procedure: HERNIA REPAIR UMBILICAL ADULT;  Surgeon: Franky Macho, MD;  [facility];  Service: General;  Laterality: N/A;   VARICOCELECTOMY Right 06/10/2021   Procedure: right, neurolysis-subinguial;  Surgeon: Malen Gauze, MD;  [facility];  Service: Urology;  Laterality: Right;   VARICOCELECTOMY Right 09/02/2021   Procedure: VARICOCELECTOMY-right subingunial neurolysis;  Surgeon: Malen Gauze, MD;  [facility];  Service: Urology;  Laterality: Right;   VARICOCELECTOMY Left 04/14/2022   Procedure: VARICOCELECTOMY-left neurolysis;  Surgeon: Malen Gauze, MD;  [facility];  Service: Urology;  Laterality: Left;   VASECTOMY N/A 09/02/2021   Procedure: VASECTOMY;  Surgeon: Malen Gauze, MD;  [facility];  Service: Urology;  Laterality: N/A;    Home Medications:  Allergies as of 08/25/2023       Reactions   Benzoin Rash   Blisters   Freederm Adhesive Remover [new Skin] Itching   Meloxicam Hives   Oxycontin [oxycodone] Other (See Comments)   Headache        Medication List        Accurate as of  August 25, 2023  9:19 AM. If you have any questions, ask your nurse or doctor.          alfuzosin 10 MG 24 hr tablet Commonly known as: UROXATRAL Take 1 tablet (10 mg total) by mouth daily.   ALPRAZolam 0.5 MG tablet Commonly known as: XANAX Take 0.5 mg by mouth 3 (three) times daily as needed for anxiety.   aspirin EC 81 MG tablet Take 81 mg by mouth in the morning. Swallow whole.   cetirizine 10 MG tablet Commonly known as: ZYRTEC Take 10 mg by mouth in the morning.   cloNIDine 0.2 MG tablet Commonly known as: CATAPRES Take 0.2 mg by mouth at bedtime.   clotrimazole-betamethasone cream Commonly known as: Lotrisone Apply 1 Application topically 2 (two) times daily. What changed:  when to take this reasons to take this   eletriptan 40 MG tablet Commonly known as: RELPAX Take 40 mg by mouth daily as needed for migraine.   fluticasone 50 MCG/ACT nasal spray Commonly known as: FLONASE Place 1 spray into both nostrils in the morning.   ibuprofen 200 MG tablet Commonly known as: ADVIL Take 400 mg by mouth every 6 (six) hours as needed for moderate pain or headache.   ICY HOT EX Apply 1 Application topically daily as needed (pain).   itraconazole 100 MG capsule Commonly known as: SPORANOX Take 100 mg by mouth 2 (two) times daily.   multivitamin with minerals Tabs tablet Take 1 tablet by mouth daily.   naproxen 500 MG tablet Commonly known as: NAPROSYN Take 500 mg by mouth daily as needed for migraine.   nystatin 100000 UNIT/ML suspension Commonly known as: MYCOSTATIN Take 5 mLs by mouth 4 (four) times daily.   ondansetron 4 MG tablet Commonly known as: ZOFRAN Take 4 mg by mouth every 8 (eight) hours as needed (migraine related nausea).   pantoprazole 40 MG tablet Commonly known as: PROTONIX Take 1 tablet (40 mg total) by mouth daily.   testosterone cypionate 200 MG/ML injection Commonly known as: DEPOTESTOSTERONE CYPIONATE Inject 1.5 mLs (300 mg  total) into the muscle every 14 (fourteen) days.   valACYclovir 1000 MG tablet Commonly known as: VALTREX Take 1,000 mg by mouth 2 (two) times daily as needed (fever blisters).   valsartan 80 MG tablet Commonly known as: DIOVAN Take 80 mg by mouth daily.        Allergies:  Allergies  Allergen Reactions   Benzoin Rash    Blisters   Freederm Adhesive Remover [New Skin] Itching   Meloxicam Hives   Oxycontin [Oxycodone] Other (See Comments)    Headache    Family History: Family History  Problem Relation Age of Onset   Hypotension Neg Hx    Anesthesia problems Neg Hx    Malignant hyperthermia Neg Hx    Pseudochol deficiency Neg Hx     Social History:  reports that he has never smoked. He has never been exposed to tobacco smoke. He has quit using smokeless tobacco. He reports that he does  not drink alcohol and does not use drugs.  ROS: All other review of systems were reviewed and are negative except what is noted above in HPI  Physical Exam: BP (!) 154/83   Pulse 61   Constitutional:  Alert and oriented, No acute distress. HEENT: [place] AT, moist mucus membranes.  Trachea midline, no masses. Cardiovascular: No clubbing, cyanosis, or edema. Respiratory: Normal respiratory effort, no increased work of breathing. GI: Abdomen is soft, nontender, nondistended, no abdominal masses GU: No CVA tenderness.  Lymph: No cervical or inguinal lymphadenopathy. Skin: No rashes, bruises or suspicious lesions. Neurologic: Grossly intact, no focal deficits, moving all 4 extremities. Psychiatric: Normal mood and affect.  Laboratory Data: Lab Results  Component Value Date   WBC 4.5 08/18/2023   HGB 13.9 08/18/2023   HCT 40.9 08/18/2023   MCV 88 08/18/2023   PLT 129 (L) 08/18/2023    Lab Results  Component Value Date   CREATININE 1.24 08/18/2023    No results found for: "PSA"  Lab Results  Component Value Date   TESTOSTERONE 542 08/18/2023    No results found for:  "HGBA1C"  Urinalysis    Component Value Date/Time   COLORURINE YELLOW 07/09/2022 2244   APPEARANCEUR Clear 09/14/2022 1040   LABSPEC 1.014 07/09/2022 2244   PHURINE 6.0 07/09/2022 2244   GLUCOSEU Negative 09/14/2022 1040   HGBUR MODERATE (A) 07/09/2022 2244   BILIRUBINUR Negative 09/14/2022 1040   KETONESUR NEGATIVE 07/09/2022 2244   PROTEINUR Negative 09/14/2022 1040   PROTEINUR NEGATIVE 07/09/2022 2244   NITRITE Negative 09/14/2022 1040   NITRITE POSITIVE (A) 07/09/2022 2244   LEUKOCYTESUR Negative 09/14/2022 1040   LEUKOCYTESUR LARGE (A) 07/09/2022 2244    Lab Results  Component Value Date   LABMICR Comment 09/14/2022   WBCUA 6-10 (A) 08/15/2022   LABEPIT None seen 08/15/2022   BACTERIA Few 08/15/2022    Pertinent Imaging: *** No results found for this or any previous visit.  No results found for this or any previous visit.  No results found for this or any previous visit.  No results found for this or any previous visit.  No results found for this or any previous visit.  No valid procedures specified. No results found for this or any previous visit.  No results found for this or any previous visit.   Assessment & Plan:    1. Hypogonadism in male ***   No follow-ups on file.  Wilkie Aye, MD  [facility] [place]

## 2023-08-29 ENCOUNTER — Encounter: Payer: Self-pay | Admitting: Urology

## 2023-08-29 NOTE — Patient Instructions (Signed)

## 2023-11-06 ENCOUNTER — Other Ambulatory Visit: Payer: Self-pay | Admitting: Urology

## 2024-01-03 ENCOUNTER — Ambulatory Visit: Payer: BC Managed Care – PPO | Admitting: Urology

## 2024-01-17 ENCOUNTER — Ambulatory Visit: Payer: BC Managed Care – PPO | Admitting: Urology

## 2024-02-24 ENCOUNTER — Other Ambulatory Visit: Payer: Self-pay | Admitting: Urology

## 2024-03-25 ENCOUNTER — Other Ambulatory Visit (INDEPENDENT_AMBULATORY_CARE_PROVIDER_SITE_OTHER): Payer: Self-pay | Admitting: Gastroenterology

## 2024-07-12 ENCOUNTER — Ambulatory Visit: Admitting: Urology

## 2024-07-22 ENCOUNTER — Encounter: Payer: Self-pay | Admitting: Urology

## 2024-07-22 NOTE — Telephone Encounter (Signed)
 Can we schedule patient for a lab visit for you to review and refill testosterone .  He was scheduled for your next available OV in January, he will need a refill prior to then.

## 2024-08-06 ENCOUNTER — Other Ambulatory Visit: Payer: Self-pay | Admitting: Urology

## 2024-08-06 MED ORDER — TESTOSTERONE CYPIONATE 200 MG/ML IM SOLN: 300.0000 mg | INTRAMUSCULAR | 3 refills | Status: DC

## 2024-08-23 ENCOUNTER — Other Ambulatory Visit: Payer: BC Managed Care – PPO

## 2024-08-30 ENCOUNTER — Ambulatory Visit: Payer: BC Managed Care – PPO | Admitting: Urology

## 2024-10-02 ENCOUNTER — Encounter (INDEPENDENT_AMBULATORY_CARE_PROVIDER_SITE_OTHER): Payer: Self-pay | Admitting: Gastroenterology

## 2024-11-19 ENCOUNTER — Other Ambulatory Visit: Payer: Self-pay | Admitting: Urology

## 2024-12-18 ENCOUNTER — Other Ambulatory Visit: Payer: Self-pay

## 2024-12-18 ENCOUNTER — Other Ambulatory Visit

## 2024-12-18 DIAGNOSIS — E291 Testicular hypofunction: Secondary | ICD-10-CM

## 2024-12-19 LAB — COMPREHENSIVE METABOLIC PANEL WITH GFR
ALT: 41 IU/L (ref 0–44)
AST: 39 IU/L (ref 0–40)
Albumin: 4.7 g/dL (ref 3.8–4.9)
Alkaline Phosphatase: 60 IU/L (ref 47–123)
BUN/Creatinine Ratio: 10 (ref 9–20)
BUN: 13 mg/dL (ref 6–24)
Bilirubin Total: 0.6 mg/dL (ref 0.0–1.2)
CO2: 28 mmol/L (ref 20–29)
Calcium: 9.7 mg/dL (ref 8.7–10.2)
Chloride: 97 mmol/L (ref 96–106)
Creatinine, Ser: 1.26 mg/dL (ref 0.76–1.27)
Globulin, Total: 2.5 g/dL (ref 1.5–4.5)
Glucose: 109 mg/dL — ABNORMAL HIGH (ref 70–99)
Potassium: 3.8 mmol/L (ref 3.5–5.2)
Sodium: 140 mmol/L (ref 134–144)
Total Protein: 7.2 g/dL (ref 6.0–8.5)
eGFR: 68 mL/min/1.73

## 2024-12-19 LAB — CBC
Hematocrit: 48.2 % (ref 37.5–51.0)
Hemoglobin: 16.2 g/dL (ref 13.0–17.7)
MCH: 30.2 pg (ref 26.6–33.0)
MCHC: 33.6 g/dL (ref 31.5–35.7)
MCV: 90 fL (ref 79–97)
Platelets: 139 x10E3/uL — ABNORMAL LOW (ref 150–450)
RBC: 5.37 x10E6/uL (ref 4.14–5.80)
RDW: 13.1 % (ref 11.6–15.4)
WBC: 6 x10E3/uL (ref 3.4–10.8)

## 2024-12-19 LAB — TESTOSTERONE: Testosterone: >1500 ng/dL — ABNORMAL HIGH (ref 264–916)

## 2024-12-25 ENCOUNTER — Ambulatory Visit: Admitting: Urology

## 2024-12-25 VITALS — BP 126/77 | HR 69

## 2024-12-25 DIAGNOSIS — R351 Nocturia: Secondary | ICD-10-CM

## 2024-12-25 DIAGNOSIS — E291 Testicular hypofunction: Secondary | ICD-10-CM

## 2024-12-25 MED ORDER — TAMSULOSIN HCL 0.4 MG PO CAPS: 0.4000 mg | ORAL_CAPSULE | Freq: Every day | ORAL | 11 refills | Status: AC

## 2024-12-25 MED ORDER — TESTOSTERONE CYPIONATE 200 MG/ML IM SOLN: 300.0000 mg | INTRAMUSCULAR | 3 refills | Status: AC

## 2024-12-25 NOTE — Progress Notes (Signed)
 "  12/25/2024 9:02 AM   Chase Wyatt May 24, 1971 [id number]  Referring provider: Donnise Norleen Lenis, MD [address] Chase Wyatt,  Chase Wyatt  [place] hypogonadism   HPI: Chase Wyatt is a 53yo here for followup for hypogonadism. Testosterone  over 1500 but he injected 300mg  testosterone  every 14 days. Hemoglobin 16.2. IPSS 3 QOL 1 on flomax  0.4mg  daily. Urine stream strong. No straining to urinate. Nocturia 0-1x. 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   BACK SURGERY     multiple   BIOPSY  03/01/2023   Procedure: BIOPSY;  Surgeon: Eartha Angelia Sieving, MD;  [facility];  Service: Gastroenterology;;   CARDIAC CATHETERIZATION  2013   Chase Wyatt, no stents   CHOLECYSTECTOMY  10/28/2011   Procedure: LAPAROSCOPIC CHOLECYSTECTOMY;  Surgeon: Oneil DELENA Budge;  [facility];  Service: General;  Laterality: N/A;   COLONOSCOPY WITH PROPOFOL  N/A 03/01/2023   Procedure: COLONOSCOPY WITH PROPOFOL ;  Surgeon: Eartha Angelia Sieving, MD;  [facility];  Service: Gastroenterology;  Laterality: N/A;  9:30 am   EPIDIDYMECTOMY Left 07/13/2022   Procedure: EPIDIDYMECTOMY;  Surgeon: Sherrilee Belvie CROME, MD;  [facility];  Service: Urology;  Laterality: Left;   ESOPHAGEAL BRUSHING  03/01/2023   Procedure: ESOPHAGEAL BRUSHING;  Surgeon: Eartha Angelia Sieving, MD;  [facility];  Service: Gastroenterology;;   ESOPHAGOGASTRODUODENOSCOPY (EGD) WITH PROPOFOL  N/A  03/01/2023   Procedure: ESOPHAGOGASTRODUODENOSCOPY (EGD) WITH PROPOFOL ;  Surgeon: Eartha Angelia Sieving, MD;  [facility];  Service: Gastroenterology;  Laterality: N/A;   HYDROCELE EXCISION Left 07/13/2022   Procedure: HYDROCELECTOMY ADULT;  Surgeon: Sherrilee Belvie CROME, MD;  [facility];  Service: Urology;  Laterality: Left;   INGUINAL HERNIA REPAIR Left 08/07/2020   Procedure: HERNIA REPAIR INGUINAL ADULT using MESH MARLEX PLUG MEDIUM;  Surgeon: Budge Oneil, MD;  [facility];  Service: General;  Laterality: Left;   INGUINAL HERNIA REPAIR Right 01/11/2021   Procedure: HERNIA REPAIR INGUINAL ADULT;  Surgeon: Budge Oneil, MD;  [facility];  Service: General;  Laterality: Right;   LAMINECTOMY THORACIC SPINE W/ PLACEMENT SPINAL CORD STIMULATOR     NASAL SINUS SURGERY     POLYPECTOMY  03/01/2023   Procedure: POLYPECTOMY;  Surgeon: Eartha Angelia Sieving, MD;  [facility];  Service: Gastroenterology;;   SHOULDER OPEN ROTATOR CUFF REPAIR Left    [place]   UMBILICAL HERNIA REPAIR N/A 08/07/2020   Procedure: HERNIA REPAIR UMBILICAL ADULT;  Surgeon: Budge Oneil, MD;  [facility];  Service: General;  Laterality: N/A;   VARICOCELECTOMY Right 06/10/2021   Procedure: right, neurolysis-subinguial;  Surgeon: Sherrilee Belvie CROME, MD;  [facility];  Service: Urology;  Laterality: Right;   VARICOCELECTOMY Right 09/02/2021   Procedure: VARICOCELECTOMY-right subingunial neurolysis;  Surgeon: Sherrilee Belvie CROME, MD;  [facility];  Service: Urology;  Laterality: Right;   VARICOCELECTOMY Left  04/14/2022   Procedure: VARICOCELECTOMY-left neurolysis;  Surgeon: Sherrilee Belvie CROME, MD;  [facility];  Service: Urology;  Laterality: Left;   VASECTOMY N/A 09/02/2021   Procedure: VASECTOMY;  Surgeon: Sherrilee Belvie CROME, MD;  [facility];  Service: Urology;  Laterality: N/A;    Home Medications:  Allergies as of 12/25/2024       Reactions    Benzoin Rash   Blisters   Freederm Adhesive Remover [new Skin] Itching   Meloxicam Hives   Oxycontin  [oxycodone ] Other (See Comments)   Headache        Medication List        Accurate as of December 25, 2024  9:02 AM. If you have any questions, ask your nurse or doctor.          alfuzosin  10 MG 24 hr tablet Commonly known as: UROXATRAL  Take 1 tablet (10 mg total) by mouth daily.   ALPRAZolam 0.5 MG tablet Commonly known as: XANAX Take 0.5 mg by mouth 3 (three) times daily as needed for anxiety.   aspirin EC 81 MG tablet Take 81 mg by mouth in the morning. Swallow whole.   cetirizine 10 MG tablet Commonly known as: ZYRTEC Take 10 mg by mouth in the morning.   cloNIDine 0.2 MG tablet Commonly known as: CATAPRES Take 0.2 mg by mouth at bedtime.   clotrimazole -betamethasone  cream Commonly known as: Lotrisone  Apply 1 Application topically 2 (two) times daily. What changed:  when to take this reasons to take this   eletriptan 40 MG tablet Commonly known as: RELPAX Take 40 mg by mouth daily as needed for migraine.   fluticasone 50 MCG/ACT nasal spray Commonly known as: FLONASE Place 1 spray into both nostrils in the morning.   ibuprofen 200 MG tablet Commonly known as: ADVIL Take 400 mg by mouth every 6 (six) hours as needed for moderate pain or headache.   ICY HOT EX Apply 1 Application topically daily as needed (pain).   itraconazole 100 MG capsule Commonly known as: SPORANOX Take 100 mg by mouth 2 (two) times daily.   multivitamin with minerals Tabs tablet Take 1 tablet by mouth daily.   naproxen 500 MG tablet Commonly known as: NAPROSYN Take 500 mg by mouth daily as needed for migraine.   nystatin 100000 UNIT/ML suspension Commonly known as: MYCOSTATIN Take 5 mLs by mouth 4 (four) times daily.   ondansetron  4 MG tablet Commonly known as: ZOFRAN  Take 4 mg by mouth every 8 (eight) hours as needed (migraine related nausea).   pantoprazole  40 MG  tablet Commonly known as: PROTONIX  TAKE 1 TABLET (40 MG TOTAL) BY MOUTH DAILY.   tamsulosin  0.4 MG Caps capsule Commonly known as: FLOMAX  TAKE 1 CAPSULE (0.4 MG TOTAL) BY MOUTH DAILY AFTER SUPPER.   testosterone  cypionate 200 MG/ML injection Commonly known as: DEPOTESTOSTERONE CYPIONATE Inject 1.5 mLs (300 mg total) into the muscle every 14 (fourteen) days.   valACYclovir 1000 MG tablet Commonly known as: VALTREX Take 1,000 mg by mouth 2 (two) times daily as needed (fever blisters).   valsartan 80 MG tablet Commonly known as: DIOVAN Take 80 mg by mouth daily.        Allergies: Allergies[1]  Family History: Family History  Problem Relation Age of Onset   Hypotension Neg Hx    Anesthesia problems Neg Hx    Malignant hyperthermia Neg Hx    Pseudochol deficiency Neg Hx     Social History:  reports that he has never smoked. He has  never been exposed to tobacco smoke. He has quit using smokeless tobacco. He reports that he does not drink alcohol and does not use drugs.  ROS: All other review of systems were reviewed and are negative except what is noted above in HPI  Physical Exam: BP 126/77   Pulse 69   Constitutional:  Alert and oriented, No acute distress. HEENT: [place] AT, moist mucus membranes.  Trachea midline, no masses. Cardiovascular: No clubbing, cyanosis, or edema. Respiratory: Normal respiratory effort, no increased work of breathing. GI: Abdomen is soft, nontender, nondistended, no abdominal masses GU: No CVA tenderness.  Lymph: No cervical or inguinal lymphadenopathy. Skin: No rashes, bruises or suspicious lesions. Neurologic: Grossly intact, no focal deficits, moving all 4 extremities. Psychiatric: Normal mood and affect.  Laboratory Data: Lab Results  Component Value Date   WBC 6.0 12/18/2024   HGB 16.2 12/18/2024   HCT 48.2 12/18/2024   MCV 90 12/18/2024   PLT 139 (L) 12/18/2024    Lab Results  Component Value Date   CREATININE 1.26 12/18/2024     No results found for: PSA  Lab Results  Component Value Date   TESTOSTERONE  >1500 (H) 12/18/2024    No results found for: HGBA1C  Urinalysis    Component Value Date/Time   COLORURINE YELLOW 07/09/2022 2244   APPEARANCEUR Clear 09/14/2022 1040   LABSPEC 1.014 07/09/2022 2244   PHURINE 6.0 07/09/2022 2244   GLUCOSEU Negative 09/14/2022 1040   HGBUR MODERATE (A) 07/09/2022 2244   BILIRUBINUR Negative 09/14/2022 1040   KETONESUR NEGATIVE 07/09/2022 2244   PROTEINUR Negative 09/14/2022 1040   PROTEINUR NEGATIVE 07/09/2022 2244   NITRITE Negative 09/14/2022 1040   NITRITE POSITIVE (A) 07/09/2022 2244   LEUKOCYTESUR Negative 09/14/2022 1040   LEUKOCYTESUR LARGE (A) 07/09/2022 2244    Lab Results  Component Value Date   LABMICR Comment 09/14/2022   WBCUA 6-10 (A) 08/15/2022   LABEPIT None seen 08/15/2022   BACTERIA Few 08/15/2022    Pertinent Imaging:  No results found for this or any previous visit.  No results found for this or any previous visit.  No results found for this or any previous visit.  No results found for this or any previous visit.  No results found for this or any previous visit.  No results found for this or any previous visit.  No results found for this or any previous visit.  No results found for this or any previous visit.   Assessment & Plan:    1. Hypogonadism in male (Primary) Continue IM testosterone  300mg  every 14 days Followup in 1 year with labs  2. Nocturia -flomax  0.4mg  daily   No follow-ups on file.  Belvie Clara, MD  [facility]        [1]  Allergies Allergen Reactions   Benzoin Rash    Blisters   Freederm Adhesive Remover [New Skin] Itching   Meloxicam Hives   Oxycontin  [Oxycodone ] Other (See Comments)    Headache   "

## 2024-12-27 LAB — TESTOSTERONE,FREE AND TOTAL
Testosterone, Free: 17.6 pg/mL (ref 7.2–24.0)
Testosterone: 688 ng/dL (ref 264–916)

## 2024-12-27 LAB — ESTRADIOL: Estradiol: 43.9 pg/mL — ABNORMAL HIGH (ref 7.6–42.6)

## 2024-12-31 ENCOUNTER — Encounter: Payer: Self-pay | Admitting: Urology

## 2024-12-31 NOTE — Patient Instructions (Signed)
 Hypogonadism, Male  Male hypogonadism is a condition of having a level of testosterone  that is lower than normal. Testosterone  is a chemical, or hormone, that is made mainly in the testicles. In boys, testosterone  is responsible for the development of male characteristics during puberty. These include: Making the penis bigger. Growing and building the muscles. Growing facial hair. Deepening the voice. In adult men, testosterone  is responsible for maintaining: An interest in sex and the ability to have sex. Muscle mass. Sperm production. Red blood cell production. Bone strength. Testosterone  also gives men energy and a sense of well-being. Testosterone  normally decreases as men age and the testicles make less testosterone . Testosterone  levels can vary from man to man. Not all men will have signs and symptoms of low testosterone . Weight, alcohol use, medicines, and certain medical conditions can affect a man's testosterone  level. What are the causes? This condition is caused by: A natural decrease in testosterone  that occurs as a man grows older. This is the main cause of this condition. Use of medicines, such as antidepressants, steroids, and opioids. Diseases and conditions that affect the testicles or the making of testosterone . These include: Injury or damage to the testicles from trauma, cancer, cancer treatment, or infection. Diabetes. Sleep apnea. Genetic conditions that men are born with. Disease of the pituitary gland. This gland is in the brain. It produces hormones. Obesity. Metabolic syndrome. This is a group of diseases that affect blood pressure, blood sugar, cholesterol, and belly fat. HIV or AIDS. Alcohol abuse. Kidney failure. Other long-term or chronic diseases. What are the signs or symptoms? Common symptoms of this condition include: Loss of interest in sex (low sex drive). Inability to have or maintain an erection (erectile dysfunction). Feeling tired  (fatigue). Mood changes, like irritability or depression. Loss of muscle and body hair. Infertility. Large breasts. Weight gain (obesity). How is this diagnosed? Your health care provider can diagnose hypogonadism based on: Your signs and symptoms. A physical exam to check your testosterone  levels. This includes blood tests. Testosterone  levels can change throughout the day. Levels are highest in the morning. You may need to have repeat blood tests before getting a diagnosis of hypogonadism. Depending on your medical history and test results, your health care provider may also do other tests to find the cause of low testosterone . How is this treated? This condition is treated with testosterone  replacement therapy. Testosterone  can be given by: Injection or through pellets inserted under the skin. Gels or patches placed on the skin or in the mouth. Testosterone  therapy is not for everyone. It has risks and side effects. Your health care provider will consider your medical history, your risk for prostate cancer, your age, and your symptoms before putting you on testosterone  replacement therapy. Follow these instructions at home: Take over-the-counter and prescription medicines only as told by your health care provider. Eat foods that are high in fiber, such as beans, whole grains, and fresh fruits and vegetables. Limit foods that are high in fat and processed sugars, such as fried or sweet foods. If you drink alcohol: Limit how much you have to 0-2 drinks a day. Know how much alcohol is in your drink. In the [place] one drink equals one 12 oz bottle of beer (355 mL), one 5 oz glass of wine (148 mL), or one 1 oz glass of hard liquor (44 mL). Return to your normal activities as told by your health care provider. Ask your health care provider what activities are safe for you. Keep all  follow-up visits. This is important. Contact a health care provider if: You have any of the signs or symptoms of  low testosterone . You have any side effects from testosterone  therapy. Summary Male hypogonadism is a condition of having a level of testosterone  that is lower than normal. The natural drop in testosterone  production that occurs with age is the most common cause of this condition. Low testosterone  can also be caused by many diseases and conditions that affect the testicles and the making of testosterone . This condition is treated with testosterone  replacement therapy. There are risks and side effects of testosterone  therapy. Your health care provider will consider your age, medical history, symptoms, and risks for prostate cancer before putting you on testosterone  therapy. This information is not intended to replace advice given to you by your health care provider. Make sure you discuss any questions you have with your health care provider. Document Revised: 11/13/2023 Document Reviewed: 11/13/2023 Elsevier Patient Education  2025 [employer].

## 2025-01-03 MED ORDER — ANASTROZOLE 1 MG PO TABS: 0.5000 mg | ORAL_TABLET | Freq: Every day | ORAL | 3 refills | Status: AC

## 2025-12-16 ENCOUNTER — Other Ambulatory Visit
